# Patient Record
Sex: Female | Born: 1980 | Race: White | Hispanic: No | Marital: Married | Smoking: Former smoker
Health system: Southern US, Community
[De-identification: ages and names within clinical notes are randomized; demographics above are authoritative.]

## PROBLEM LIST (undated history)

## (undated) ENCOUNTER — Inpatient Hospital Stay (HOSPITAL_COMMUNITY): Payer: Self-pay

## (undated) DIAGNOSIS — T7840XA Allergy, unspecified, initial encounter: Secondary | ICD-10-CM

## (undated) DIAGNOSIS — J45909 Unspecified asthma, uncomplicated: Secondary | ICD-10-CM

## (undated) DIAGNOSIS — Z0000 Encounter for general adult medical examination without abnormal findings: Secondary | ICD-10-CM

## (undated) DIAGNOSIS — M722 Plantar fascial fibromatosis: Secondary | ICD-10-CM

## (undated) DIAGNOSIS — N979 Female infertility, unspecified: Secondary | ICD-10-CM

## (undated) DIAGNOSIS — F419 Anxiety disorder, unspecified: Secondary | ICD-10-CM

## (undated) DIAGNOSIS — F32A Depression, unspecified: Secondary | ICD-10-CM

## (undated) DIAGNOSIS — F329 Major depressive disorder, single episode, unspecified: Secondary | ICD-10-CM

## (undated) HISTORY — DX: Anxiety disorder, unspecified: F41.9

## (undated) HISTORY — PX: OTHER SURGICAL HISTORY: SHX169

## (undated) HISTORY — DX: Depression, unspecified: F32.A

## (undated) HISTORY — DX: Plantar fascial fibromatosis: M72.2

## (undated) HISTORY — PX: WISDOM TOOTH EXTRACTION: SHX21

## (undated) HISTORY — PX: APPENDECTOMY: SHX54

## (undated) HISTORY — DX: Allergy, unspecified, initial encounter: T78.40XA

## (undated) HISTORY — DX: Major depressive disorder, single episode, unspecified: F32.9

## (undated) HISTORY — DX: Unspecified asthma, uncomplicated: J45.909

---
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
## 2015-10-02 ENCOUNTER — Emergency Department (HOSPITAL_COMMUNITY)
Admission: EM | Admit: 2015-10-02 | Discharge: 2015-10-02 | Disposition: A | Discharge status: Home / Self Care | Payer: BLUE CROSS/BLUE SHIELD | Attending: Emergency Medicine | Admitting: Emergency Medicine

## 2015-10-02 ENCOUNTER — Ambulatory Visit (INDEPENDENT_AMBULATORY_CARE_PROVIDER_SITE_OTHER): Payer: BLUE CROSS/BLUE SHIELD

## 2015-10-02 ENCOUNTER — Ambulatory Visit (INDEPENDENT_AMBULATORY_CARE_PROVIDER_SITE_OTHER): Payer: BLUE CROSS/BLUE SHIELD | Admitting: Physician Assistant

## 2015-10-02 ENCOUNTER — Encounter (HOSPITAL_COMMUNITY): Payer: Self-pay | Admitting: Emergency Medicine

## 2015-10-02 VITALS — BP 110/60 | HR 97 | Temp 103.1°F | Resp 16 | Ht 68.0 in | Wt 173.0 lb

## 2015-10-02 DIAGNOSIS — R112 Nausea with vomiting, unspecified: Secondary | ICD-10-CM | POA: Diagnosis not present

## 2015-10-02 DIAGNOSIS — R509 Fever, unspecified: Secondary | ICD-10-CM

## 2015-10-02 DIAGNOSIS — J159 Unspecified bacterial pneumonia: Secondary | ICD-10-CM | POA: Diagnosis not present

## 2015-10-02 DIAGNOSIS — G44209 Tension-type headache, unspecified, not intractable: Secondary | ICD-10-CM | POA: Diagnosis not present

## 2015-10-02 DIAGNOSIS — Z3202 Encounter for pregnancy test, result negative: Secondary | ICD-10-CM | POA: Insufficient documentation

## 2015-10-02 DIAGNOSIS — J189 Pneumonia, unspecified organism: Secondary | ICD-10-CM

## 2015-10-02 DIAGNOSIS — R05 Cough: Secondary | ICD-10-CM | POA: Diagnosis present

## 2015-10-02 LAB — CBC WITH DIFFERENTIAL/PLATELET
Basophils Absolute: 0 10*3/uL (ref 0.0–0.1)
Basophils Relative: 0 %
Eosinophils Absolute: 0 10*3/uL (ref 0.0–0.7)
Eosinophils Relative: 0 %
HEMATOCRIT: 39.7 % (ref 36.0–46.0)
Hemoglobin: 14 g/dL (ref 12.0–15.0)
LYMPHS ABS: 0.6 10*3/uL — AB (ref 0.7–4.0)
LYMPHS PCT: 6 %
MCH: 32.7 pg (ref 26.0–34.0)
MCHC: 35.3 g/dL (ref 30.0–36.0)
MCV: 92.8 fL (ref 78.0–100.0)
MONO ABS: 0.3 10*3/uL (ref 0.1–1.0)
MONOS PCT: 4 %
NEUTROS ABS: 8.3 10*3/uL — AB (ref 1.7–7.7)
Neutrophils Relative %: 90 %
Platelets: 153 10*3/uL (ref 150–400)
RBC: 4.28 MIL/uL (ref 3.87–5.11)
RDW: 13.4 % (ref 11.5–15.5)
WBC: 9.2 10*3/uL (ref 4.0–10.5)

## 2015-10-02 LAB — POCT CBC
Granulocyte percent: 90.1 %G — AB (ref 37–80)
HEMATOCRIT: 40.1 % (ref 37.7–47.9)
HEMOGLOBIN: 13.9 g/dL (ref 12.2–16.2)
LYMPH, POC: 0.2 — AB (ref 0.6–3.4)
MCH, POC: 32.1 pg — AB (ref 27–31.2)
MCHC: 34.8 g/dL (ref 31.8–35.4)
MCV: 92.5 fL (ref 80–97)
MID (cbc): 0.2 (ref 0–0.9)
MPV: 8.3 fL (ref 0–99.8)
POC GRANULOCYTE: 7.7 — AB (ref 2–6.9)
POC LYMPH PERCENT: 7 %L — AB (ref 10–50)
POC MID %: 2.9 %M (ref 0–12)
Platelet Count, POC: 148 10*3/uL (ref 142–424)
RBC: 4.34 M/uL (ref 4.04–5.48)
RDW, POC: 13.5 %
WBC: 8.5 10*3/uL (ref 4.6–10.2)

## 2015-10-02 LAB — BASIC METABOLIC PANEL
ANION GAP: 10 (ref 5–15)
BUN: 10 mg/dL (ref 6–20)
CALCIUM: 8.4 mg/dL — AB (ref 8.9–10.3)
CO2: 20 mmol/L — AB (ref 22–32)
CREATININE: 0.88 mg/dL (ref 0.44–1.00)
Chloride: 100 mmol/L — ABNORMAL LOW (ref 101–111)
GFR calc Af Amer: >60 mL/min (ref >60)
GFR calc non Af Amer: >60 mL/min (ref >60)
GLUCOSE: 111 mg/dL — AB (ref 65–99)
Potassium: 3.9 mmol/L (ref 3.5–5.1)
Sodium: 130 mmol/L — ABNORMAL LOW (ref 135–145)

## 2015-10-02 LAB — I-STAT CG4 LACTIC ACID, ED
LACTIC ACID, VENOUS: 1.28 mmol/L (ref 0.5–2.0)
Lactic Acid, Venous: 0.96 mmol/L (ref 0.5–2.0)

## 2015-10-02 LAB — I-STAT BETA HCG BLOOD, ED (MC, WL, AP ONLY): I-stat hCG, quantitative: <5 m[IU]/mL (ref <5)

## 2015-10-02 MED ORDER — GUAIFENESIN-CODEINE 100-10 MG/5ML PO SOLN: 5.0000 mL | Freq: Four times a day (QID) | ORAL | Status: DC | PRN

## 2015-10-02 MED ORDER — AMOXICILLIN 500 MG PO CAPS: 1000.0000 mg | ORAL_CAPSULE | Freq: Three times a day (TID) | ORAL | Status: AC

## 2015-10-02 MED ORDER — AMOXICILLIN 500 MG PO CAPS
1000.0000 mg | ORAL_CAPSULE | Freq: Once | ORAL | Status: AC
  Administered 2015-10-02: 1000 mg via ORAL
  Filled 2015-10-02: qty 2

## 2015-10-02 MED ORDER — AZITHROMYCIN 250 MG PO TABS
500.0000 mg | ORAL_TABLET | Freq: Once | ORAL | Status: AC
  Administered 2015-10-02: 500 mg via ORAL
  Filled 2015-10-02: qty 2

## 2015-10-02 MED ORDER — AEROCHAMBER PLUS FLO-VU SMALL MISC
1.0000 | Freq: Once | Status: AC
  Administered 2015-10-02: 1

## 2015-10-02 MED ORDER — AZITHROMYCIN 250 MG PO TABS: 250.0000 mg | ORAL_TABLET | Freq: Every day | ORAL | Status: AC

## 2015-10-02 MED ORDER — SODIUM CHLORIDE 0.9 % IV BOLUS (SEPSIS)
1000.0000 mL | Freq: Once | INTRAVENOUS | Status: AC
  Administered 2015-10-02: 1000 mL via INTRAVENOUS

## 2015-10-02 MED ORDER — ALBUTEROL SULFATE HFA 108 (90 BASE) MCG/ACT IN AERS
2.0000 | INHALATION_SPRAY | Freq: Once | RESPIRATORY_TRACT | Status: AC
  Administered 2015-10-02: 2 via RESPIRATORY_TRACT
  Filled 2015-10-02: qty 6.7

## 2015-10-02 MED ORDER — IPRATROPIUM-ALBUTEROL 0.5-2.5 (3) MG/3ML IN SOLN
3.0000 mL | Freq: Once | RESPIRATORY_TRACT | Status: AC
  Administered 2015-10-02: 3 mL via RESPIRATORY_TRACT
  Filled 2015-10-02: qty 3

## 2015-10-02 NOTE — ED Notes (Signed)
Pt reports taking  of her own Tylenol for a fever.

## 2015-10-02 NOTE — Discharge Instructions (Signed)
Please return without fail for worsening symptoms, including persistent fever, confusion, difficulty breathing, or any other symptoms concerning to you. Please see your primary care physician on Monday or Tuesday for re-evaluation.  Community-Acquired Pneumonia, Adult Pneumonia is an infection of the lungs. One type of pneumonia can happen while a person is in a hospital. A different type can happen when a person is not in a hospital (community-acquired pneumonia). It is easy for this kind to spread from person to person. It can spread to you if you breathe near an infected person who coughs or sneezes. Some symptoms include:  A dry cough.  A wet (productive) cough.  Fever.  Sweating.  Chest pain. HOME CARE  Take over-the-counter and prescription medicines only as told by your doctor.  Only take cough medicine if you are losing sleep.  If you were prescribed an antibiotic medicine, take it as told by your doctor. Do not stop taking the antibiotic even if you start to feel better.  Sleep with your head and neck raised (elevated). You can do this by putting a few pillows under your head, or you can sleep in a recliner.  Do not use tobacco products. These include cigarettes, chewing tobacco, and e-cigarettes. If you need help quitting, ask your doctor.  Drink enough water to keep your pee (urine) clear or pale yellow. A shot (vaccine) can help prevent pneumonia. Shots are often suggested for:  People older than 35 years of age.  People older than 35 years of age:  Who are having cancer treatment.  Who have long-term (chronic) lung disease.  Who have problems with their body's defense system (immune system). You may also prevent pneumonia if you take these actions:  Get the flu (influenza) shot every year.  Go to the dentist as often as told.  Wash your hands often. If soap and water are not available, use hand sanitizer. GET HELP IF:  You have a fever.  You lose sleep  because your cough medicine does not help. GET HELP RIGHT AWAY IF:  You are short of breath and it gets worse.  You have more chest pain.  Your sickness gets worse. This is very serious if:  You are an older adult.  Your body's defense system is weak.  You cough up blood.   This information is not intended to replace advice given to you by your health care provider. Make sure you discuss any questions you have with your health care provider.   Document Released: 02/14/2008 Document Revised: 05/19/2015 Document Reviewed: 12/23/2014 Elsevier Interactive Patient Education [employer]

## 2015-10-02 NOTE — Progress Notes (Signed)
Subjective:    Patient ID: Jasmine Bullock, female    DOB: 1980-10-12, 35 y.o.   MRN: [id number]  Chief Complaint  Patient presents with  . Cough    All symptoms x 3 days  . Headache  . Fever  . Emesis   Medications, allergies, past medical history, surgical history, family history, social history and problem list reviewed and updated.  HPI  35 yo healthy female presents with above complaints.   Symptoms started 5 days ago with nausea. That night had cold sweats and chills. Persistent since onset. Checked temp for the first time 2 days ago and has been running 103-104 for past 2 days. Comes down with tylenol/ibuprofen. Coughing persistent past few days with yellow/brown sputum.   Temp at home this am 104, 103.1 in clinic. Got the flu vaccine this fall. Is living with her brother in law right now who is an MD. He brought flu kit from clinic and swabbed her yesterday, negative per pt and husband. 4-5 total episodes non bloody emesis past few days. No diarrhea. Intermittent frontal headaches past few days. None currently. Denies neck stiffness. No known sick contacts but has young son along with 2 nephews she's living with. Muscles achy past few days. No hx migraines. Denies abd pain.   Review of Systems See HPI     Objective:   Physical Exam  Constitutional: She appears well-developed and well-nourished.  Non-toxic appearance. She does not have a sickly appearance. She does not appear ill. No distress.  BP 110/60 mmHg  Pulse 97  Temp(Src) 103.1 F (39.5 C) (Oral)  Resp 16  Ht 5' 8"  (1.727 m)  Wt 173 lb (78.472 kg)  BMI 26.31 kg/m2  SpO2 93%  LMP 10/02/2015   HENT:  Right Ear: Tympanic membrane normal.  Left Ear: Tympanic membrane normal.  Nose: No mucosal edema or rhinorrhea. Right sinus exhibits no maxillary sinus tenderness and no frontal sinus tenderness. Left sinus exhibits no maxillary sinus tenderness and no frontal sinus tenderness.  Mouth/Throat: Uvula is midline,  oropharynx is clear and moist and mucous membranes are normal.  Neck: No Brudzinski's sign noted.  Pulmonary/Chest: Effort normal. No tachypnea. She has no decreased breath sounds. She has no wheezes. She has rhonchi in the right upper field and the right lower field. She has rales in the right upper field.  Lymphadenopathy:       Head (right side): No submental, no submandibular and no tonsillar adenopathy present.       Head (left side): No submental, no submandibular and no tonsillar adenopathy present.    She has cervical adenopathy.       Right cervical: Posterior cervical adenopathy present. No superficial cervical and no deep cervical adenopathy present.      Left cervical: Posterior cervical adenopathy present. No superficial cervical and no deep cervical adenopathy present.   Results for orders placed or performed in visit on 10/02/15  POCT CBC  Result Value Ref Range   WBC 8.5 4.6 - 10.2 K/uL   Lymph, poc 0.2 (A) 0.6 - 3.4   POC LYMPH PERCENT 7.0 (A) 10 - 50 %L   MID (cbc) 0.2 0 - 0.9   POC MID % 2.9 0 - 12 %M   POC Granulocyte 7.7 (A) 2 - 6.9   Granulocyte percent 90.1 (A) 37 - 80 %G   RBC 4.34 4.04 - 5.48 M/uL   Hemoglobin 13.9 12.2 - 16.2 g/dL   HCT, POC 40.1 37.7 - 47.9 %  MCV 92.5 80 - 97 fL   MCH, POC 32.1 (A) 27 - 31.2 pg   MCHC 34.8 31.8 - 35.4 g/dL   RDW, POC 13.5 %   Platelet Count, POC 148 142 - 424 K/uL   MPV 8.3 0 - 99.8 fL   [facility] reading (PRIMARY) by  Dr. Linna Darner. Chest findings: Bilateral patchy infiltrate with dense RLL infiltrate and probable effusion.      Assessment & Plan:   Non-intractable vomiting with nausea, vomiting of unspecified type  Chills with fever - Plan: DG Chest 2 View, POCT CBC, Epstein-Barr virus VCA antibody panel  Tension-type headache, not intractable, unspecified chronicity pattern --cxr concerning for bilateral pna, o2 low normal for 35 yo female, persistent fevers --to ED for further eval --ebv panel sent, will f/u as  results return --no leukocytosis  Julieta Gutting, PA-C Physician Assistant-Certified [facility]  10/02/2015 2:05 PM

## 2015-10-02 NOTE — ED Provider Notes (Addendum)
CSN: [id number]     Arrival date & time 10/02/15  1424 History   First MD Initiated Contact with Patient 10/02/15 1720     Chief Complaint  Patient presents with  . Pneumonia     (Consider location/radiation/quality/duration/timing/severity/associated sxs/prior Treatment) HPI 35 year old female who presents with 4 days of cough. Is otherwise healthy. States that she is currently living with her brother-in-law's family who has all been recently ill with upper respiratory infection. Her husband also states that he had recently finished a course of antibiotics for respiratory symptoms. 4 days ago developed mild nausea, with productive cough and fever. Has been having chest tightness and increasing shortness of breath. Seen at urgent care today with a chest x-ray suggestive of pneumonia. She was sent to the ED for further evaluation. Denies chest pain, lower extremity swelling.  History reviewed. No pertinent past medical history. History reviewed. No pertinent past surgical history. Family History  Problem Relation Age of Onset  . Hypertension Mother   . Cancer Maternal Grandmother   . High Cholesterol Paternal Grandmother   . Diabetes Paternal Grandfather    Social History  Substance Use Topics  . Smoking status: Never Smoker   . Smokeless tobacco: None  . Alcohol Use: None   OB History    No data available     Review of Systems 10/14 systems reviewed and are negative other than those stated in the HPI    Allergies  Review of patient's allergies indicates no known allergies.  Home Medications   Prior to Admission medications   Medication Sig Start Date End Date Taking? Authorizing Provider  amoxicillin (AMOXIL) 500 MG capsule Take 2 capsules (1,000 mg total) by mouth 3 (three) times daily. 10/03/15 10/09/15  Lavera Guise, MD  azithromycin (ZITHROMAX) 250 MG tablet Take 1 tablet (250 mg total) by mouth daily. Take first 2 tablets together, then 1 every day until finished.  10/03/15 10/06/15  Lavera Guise, MD  citalopram (CELEXA) 10 MG tablet Take 30 mg by mouth. 05/06/15   Historical Provider, MD  guaiFENesin-codeine 100-10 MG/5ML syrup Take 5 mLs by mouth every 6 (six) hours as needed for cough. 10/02/15   Lavera Guise, MD  PRENATAL 28-0.8 MG TABS Take by mouth.    Historical Provider, MD   BP 97/54 mmHg  Pulse 85  Temp(Src) 100.5 F (38.1 C) (Oral)  Resp 18  SpO2 94%  LMP 10/02/2015 Physical Exam Physical Exam  Nursing note and vitals reviewed. Constitutional: Well developed, well nourished, non-toxic, and in no acute distress Head: Normocephalic and atraumatic.  Mouth/Throat: Oropharynx is clear and moist.  Neck: Normal range of motion. Neck supple.  Cardiovascular: Normal rate and regular rhythm.   Pulmonary/Chest: Effort normal. No conversational dyspnea. Coarse breath sounds throughout with bronchospastic cough. Abdominal: Soft. There is no tenderness. There is no rebound and no guarding.  Musculoskeletal: Normal range of motion.  Neurological: Alert, no facial droop, fluent speech, moves all extremities symmetrically Skin: Skin is warm and dry.  Psychiatric: Cooperative  ED Course  Procedures (including critical care time) Labs Review Labs Reviewed  CBC WITH DIFFERENTIAL/PLATELET - Abnormal; Notable for the following:    Neutro Abs 8.3 (*)    Lymphs Abs 0.6 (*)    All other components within normal limits  BASIC METABOLIC PANEL - Abnormal; Notable for the following:    Sodium 130 (*)    Chloride 100 (*)    CO2 20 (*)    Glucose, Bld 111 (*)  Calcium 8.4 (*)    All other components within normal limits  I-STAT CG4 LACTIC ACID, ED  I-STAT BETA HCG BLOOD, ED ([facility], [facility], [facility] ONLY)  I-STAT CG4 LACTIC ACID, ED    Imaging Review Dg Chest 2 View  10/02/2015  CLINICAL DATA:  Cough and fever EXAM: CHEST - 2 VIEW COMPARISON:  None. FINDINGS: Cardiac shadow is at the upper limits of normal in size. Patchy infiltrates are noted throughout both  lungs but worst in the right lower lobe consistent with multifocal pneumonia. No bony abnormality is noted. IMPRESSION: Multifocal pneumonia. Electronically Signed   By: Alcide Clever M.D.   On: 10/02/2015 13:54   I have personally reviewed and evaluated these images and lab results as part of my medical decision-making.   EKG Interpretation None      MDM   Final diagnoses:  Community acquired pneumonia    34 year old female who presents with pneumonia. Is febrile on arrival to 102 Fahrenheit, but hemodynamically stable without tachycardia. She is saturating in the mid 90 percentile on room air, with normal work of breathing, no conversational dyspnea. With bronchospastic cough and coarse breath sounds on lung exam. Basic blood work reveals normal lactate and no significant leukocytosis. She is not pregnant and the remainder of her blood work is unremarkable. Chest x-ray from urgent care is reviewed and she does have evidence of multifocal pneumonia. No risk factors for HCAP. No signs of systemic illness. Fever resolves with tylenol and no tachycardia or hypotension. Ambulates in ED without reported dyspnea and no hypoxia. Cough improved with breathing treatment. Given azithromycin and amoxicillin for treatment. Will have close 1-2 day follow-up with PCP. Strict return instructions reviewed.  She expressed understanding of all discharge instructions and felt comfortable with the plan of care.     Lavera Guise, MD 10/03/15 [id number]  Lavera Guise, MD 10/03/15 [phone]

## 2015-10-02 NOTE — ED Notes (Signed)
Pt ambulates independently and with steady gait at time of discharge. Discharge instructions and follow up information reviewed with patient. No other questions or concerns voiced at this time.  

## 2015-10-02 NOTE — ED Notes (Signed)
Pt here from [facility] c/o not feeling well, fever and productive cough x 4 days; pt diagnosed with pna

## 2015-10-02 NOTE — Patient Instructions (Signed)
Your chest xray was concerning for pneumonia and your oxygen saturation was low. Please go to the ED for further evaluation.  You will likely need antibiotics, whether you get these as an inpatient or outpatient they can decide.   Community-Acquired Pneumonia, Adult Pneumonia is an infection of the lungs. There are different types of pneumonia. One type can develop while a person is in a hospital. A different type, called community-acquired pneumonia, develops in people who are not, or have not recently been, in the hospital or other health care facility.  CAUSES Pneumonia may be caused by bacteria, viruses, or funguses. Community-acquired pneumonia is often caused by Streptococcus pneumonia bacteria. These bacteria are often passed from one person to another by breathing in droplets from the cough or sneeze of an infected person. RISK FACTORS The condition is more likely to develop in:  People who havechronic diseases, such as chronic obstructive pulmonary disease (COPD), asthma, congestive heart failure, cystic fibrosis, diabetes, or kidney disease.  People who haveearly-stage or late-stage HIV.  People who havesickle cell disease.  People who havehad their spleen removed (splenectomy).  People who havepoor Administrator.  People who havemedical conditions that increase the risk of breathing in (aspirating) secretions their own mouth and nose.   People who havea weakened immune system (immunocompromised).  People who smoke.  People whotravel to areas where pneumonia-causing germs commonly exist.  People whoare around animal habitats or animals that have pneumonia-causing germs, including birds, bats, rabbits, cats, and farm animals. SYMPTOMS Symptoms of this condition include:  Adry cough.  A wet (productive) cough.  Fever.  Sweating.  Chest pain, especially when breathing deeply or coughing.  Rapid breathing or difficulty breathing.  Shortness of  breath.  Shaking chills.  Fatigue.  Muscle aches. DIAGNOSIS Your health care provider will take a medical history and perform a physical exam. You may also have other tests, including:  Imaging studies of your chest, including X-rays.  Tests to check your blood oxygen level and other blood gases.  Other tests on blood, mucus (sputum), fluid around your lungs (pleural fluid), and urine. If your pneumonia is severe, other tests may be done to identify the specific cause of your illness. TREATMENT The type of treatment that you receive depends on many factors, such as the cause of your pneumonia, the medicines you take, and other medical conditions that you have. For most adults, treatment and recovery from pneumonia may occur at home. In some cases, treatment must happen in a hospital. Treatment may include:  Antibiotic medicines, if the pneumonia was caused by bacteria.  Antiviral medicines, if the pneumonia was caused by a virus.  Medicines that are given by mouth or through an IV tube.  Oxygen.  Respiratory therapy. Although rare, treating severe pneumonia may include:  Mechanical ventilation. This is done if you are not breathing well on your own and you cannot maintain a safe blood oxygen level.  Thoracentesis. This procedureremoves fluid around one lung or both lungs to help you breathe better. HOME CARE INSTRUCTIONS  Take over-the-counter and prescription medicines only as told by your health care provider.  Only takecough medicine if you are losing sleep. Understand that cough medicine can prevent your body's natural ability to remove mucus from your lungs.  If you were prescribed an antibiotic medicine, take it as told by your health care provider. Do not stop taking the antibiotic even if you start to feel better.  Sleep in a semi-upright position at night. Try sleeping in  a reclining chair, or place a few pillows under your head.  Do not use tobacco products,  including cigarettes, chewing tobacco, and e-cigarettes. If you need help quitting, ask your health care provider.  Drink enough water to keep your urine clear or pale yellow. This will help to thin out mucus secretions in your lungs. PREVENTION There are ways that you can decrease your risk of developing community-acquired pneumonia. Consider getting a pneumococcal vaccine if:  You are older than 35 years of age.  You are older than 35 years of age and are undergoing cancer treatment, have chronic lung disease, or have other medical conditions that affect your immune system. Ask your health care provider if this applies to you. There are different types and schedules of pneumococcal vaccines. Ask your health care provider which vaccination option is best for you. You may also prevent community-acquired pneumonia if you take these actions:  Get an influenza vaccine every year. Ask your health care provider which type of influenza vaccine is best for you.  Go to the dentist on a regular basis.  Wash your hands often. Use hand sanitizer if soap and water are not available. SEEK MEDICAL CARE IF:  You have a fever.  You are losing sleep because you cannot control your cough with cough medicine. SEEK IMMEDIATE MEDICAL CARE IF:  You have worsening shortness of breath.  You have increased chest pain.  Your sickness becomes worse, especially if you are an older adult or have a weakened immune system.  You cough up blood.   This information is not intended to replace advice given to you by your health care provider. Make sure you discuss any questions you have with your health care provider.   Document Released: 08/28/2005 Document Revised: 05/19/2015 Document Reviewed: 12/23/2014 Elsevier Interactive Patient Education [employer]

## 2015-10-02 NOTE — ED Notes (Signed)
93% RA while Ambulating.

## 2015-10-04 LAB — EPSTEIN-BARR VIRUS VCA ANTIBODY PANEL
EBV NA IGG: 266 U/mL — AB (ref <18.0)
EBV VCA IGG: 340 U/mL — AB (ref <18.0)

## 2015-10-05 ENCOUNTER — Ambulatory Visit (INDEPENDENT_AMBULATORY_CARE_PROVIDER_SITE_OTHER): Payer: BLUE CROSS/BLUE SHIELD | Admitting: Family Medicine

## 2015-10-05 ENCOUNTER — Ambulatory Visit: Payer: BLUE CROSS/BLUE SHIELD | Admitting: Emergency Medicine

## 2015-10-05 ENCOUNTER — Encounter: Payer: Self-pay | Admitting: Family Medicine

## 2015-10-05 VITALS — BP 99/69 | HR 88 | Temp 97.5°F | Resp 16 | Ht 68.0 in | Wt 168.0 lb

## 2015-10-05 DIAGNOSIS — K219 Gastro-esophageal reflux disease without esophagitis: Secondary | ICD-10-CM

## 2015-10-05 DIAGNOSIS — J189 Pneumonia, unspecified organism: Secondary | ICD-10-CM

## 2015-10-05 NOTE — Progress Notes (Signed)
   Subjective:    Patient ID: Jasmine Bullock, female    DOB: 1980-12-26, 35 y.o.   MRN: [id number]  HPI This is a pleasant 35 yo female who presents today for follow up of ED visit 10/02/15. She was seen at [facility] walk in center with fever, cough, headache and vomiting. She had bilateral patchy infiltrates on CXR and pulse ox of 93%. She was sent to the ED where she was given breathing treatment, amoxicillin and azithromycin and released. She is feeling better with no fever over 101 in last 24 hours. She is tolerating medications without difficulty. Appetite has returned. Still with severe fatigue. Myalgias improving. Feels winded with exertion, no SOB with rest or talking, some pain with cough. Little sputum production. Feels burning in esophagus. Has ranitidine at home, hasn't tried. Has albuterol inhaler, has not been using regularly.   No past medical history on file. No past surgical history on file. Family History  Problem Relation Age of Onset  . Hypertension Mother   . Cancer Maternal Grandmother   . High Cholesterol Paternal Grandmother   . Diabetes Paternal Grandfather    Social History  Substance Use Topics  . Smoking status: Never Smoker   . Smokeless tobacco: None  . Alcohol Use: 2.4 oz/week    4 Standard drinks or equivalent per week    Review of Systems Per HPI    Objective:   Physical Exam Physical Exam  Constitutional: Oriented to person, place, and time. She appears well-developed and well-nourished. Appears fatigued.  HENT:  Head: Normocephalic and atraumatic.  Eyes: Conjunctivae are normal.  Neck: Normal range of motion. Neck supple.  Cardiovascular: Normal rate, regular rhythm and normal heart sounds.   Pulmonary/Chest: Effort normal and breath sounds normal.  Musculoskeletal: Normal range of motion.  Neurological: Alert and oriented to person, place, and time.  Skin: Skin is warm and dry.  Psychiatric: Normal mood and affect. Behavior is normal. Judgment and  thought content normal.  Vitals reviewed. BP 99/69 mmHg  Pulse 88  Temp(Src) 97.5 F (36.4 C) (Oral)  Resp 16  Ht  (1.727 m)  Wt 168 lb (76.204 kg)  BMI 25.55 kg/m2  SpO2 97%  LMP 10/02/2015 Wt Readings from Last 3 Encounters:  10/05/15 168 lb (76.204 kg)  10/02/15 173 lb (78.472 kg)       Assessment & Plan:  1. CAP (community acquired pneumonia) - patient improving on amoxicillin/azithromycin- she was instructed to finish - encouraged her to use albuterol q4-6 hours while awake for cough - add mucinex to thin secretions - push fluids - repeat CXR in 2-3 weeks  2. Gastroesophageal reflux disease, esophagitis presence not specified - suggested she take ranitidine BID for 2 weeks   - follow up in 2 weeks for repeat CXR, RTC precautions reviewed  Olean Ree, FNP-BC  [facility], [facility]  10/07/2015 11:00 PM

## 2015-10-05 NOTE — Patient Instructions (Signed)
Please take mucinex to thin your secretions  Take your ranitidine twice a day for 2 weeks for healing of your stomach/esophagus  Try to use your inhaler every 4-6 hours for the next couple of days to see if that helps your breathing  Drink lots of fluids until your urine is light yellow.

## 2015-10-09 ENCOUNTER — Ambulatory Visit (INDEPENDENT_AMBULATORY_CARE_PROVIDER_SITE_OTHER): Payer: BLUE CROSS/BLUE SHIELD

## 2015-10-09 ENCOUNTER — Telehealth: Payer: Self-pay | Admitting: *Deleted

## 2015-10-09 ENCOUNTER — Ambulatory Visit (INDEPENDENT_AMBULATORY_CARE_PROVIDER_SITE_OTHER): Payer: BLUE CROSS/BLUE SHIELD | Admitting: Family Medicine

## 2015-10-09 ENCOUNTER — Ambulatory Visit (HOSPITAL_COMMUNITY): Payer: Self-pay

## 2015-10-09 VITALS — BP 108/68 | HR 69 | Temp 97.9°F | Resp 20 | Ht 67.72 in | Wt 165.6 lb

## 2015-10-09 DIAGNOSIS — J9801 Acute bronchospasm: Secondary | ICD-10-CM | POA: Diagnosis not present

## 2015-10-09 DIAGNOSIS — R6883 Chills (without fever): Secondary | ICD-10-CM

## 2015-10-09 DIAGNOSIS — J189 Pneumonia, unspecified organism: Secondary | ICD-10-CM | POA: Diagnosis not present

## 2015-10-09 DIAGNOSIS — R05 Cough: Secondary | ICD-10-CM

## 2015-10-09 DIAGNOSIS — E871 Hypo-osmolality and hyponatremia: Secondary | ICD-10-CM

## 2015-10-09 LAB — COMPREHENSIVE METABOLIC PANEL
ALBUMIN: 3.6 g/dL (ref 3.6–5.1)
ALT: 24 U/L (ref 6–29)
AST: 25 U/L (ref 10–30)
Alkaline Phosphatase: 44 U/L (ref 33–115)
BUN: 9 mg/dL (ref 7–25)
CALCIUM: 8.8 mg/dL (ref 8.6–10.2)
CHLORIDE: 105 mmol/L (ref 98–110)
CO2: 24 mmol/L (ref 20–31)
Creat: 0.69 mg/dL (ref 0.50–1.10)
Glucose, Bld: 80 mg/dL (ref 65–99)
POTASSIUM: 4.7 mmol/L (ref 3.5–5.3)
Sodium: 138 mmol/L (ref 135–146)
TOTAL PROTEIN: 6.9 g/dL (ref 6.1–8.1)
Total Bilirubin: 0.5 mg/dL (ref 0.2–1.2)

## 2015-10-09 LAB — POCT CBC
Granulocyte percent: 80.7 %G — AB (ref 37–80)
HEMATOCRIT: 41 % (ref 37.7–47.9)
HEMOGLOBIN: 14.4 g/dL (ref 12.2–16.2)
Lymph, poc: 2.6 (ref 0.6–3.4)
MCH: 32.2 pg — AB (ref 27–31.2)
MCHC: 35 g/dL (ref 31.8–35.4)
MCV: 92 fL (ref 80–97)
MID (cbc): 0.4 (ref 0–0.9)
MPV: 7.5 fL (ref 0–99.8)
POC GRANULOCYTE: 12.5 — AB (ref 2–6.9)
POC LYMPH PERCENT: 16.7 %L (ref 10–50)
POC MID %: 2.6 % (ref 0–12)
Platelet Count, POC: 454 10*3/uL — AB (ref 142–424)
RBC: 4.46 M/uL (ref 4.04–5.48)
RDW, POC: 13.5 %
WBC: 15.5 10*3/uL — AB (ref 4.6–10.2)

## 2015-10-09 LAB — POCT INFLUENZA A/B
Influenza A, POC: NEGATIVE
Influenza B, POC: NEGATIVE

## 2015-10-09 LAB — GLUCOSE, POCT (MANUAL RESULT ENTRY): POC GLUCOSE: 96 mg/dL (ref 70–99)

## 2015-10-09 MED ORDER — ALBUTEROL SULFATE (2.5 MG/3ML) 0.083% IN NEBU
2.5000 mg | INHALATION_SOLUTION | Freq: Once | RESPIRATORY_TRACT | Status: AC
  Administered 2015-10-09: 2.5 mg via RESPIRATORY_TRACT

## 2015-10-09 MED ORDER — ALBUTEROL SULFATE HFA 108 (90 BASE) MCG/ACT IN AERS: 2.0000 | INHALATION_SPRAY | Freq: Four times a day (QID) | RESPIRATORY_TRACT | Status: DC | PRN

## 2015-10-09 MED ORDER — LEVOFLOXACIN 750 MG PO TABS: 750.0000 mg | ORAL_TABLET | Freq: Every day | ORAL | Status: DC

## 2015-10-09 NOTE — Telephone Encounter (Signed)
Pt called, problem already resolved

## 2015-10-09 NOTE — Telephone Encounter (Signed)
Spoke with pharmacist/[facility].  Benefits of Levaquin outweigh the risk of side effects of QT prolongation.  Patient without any cardiac history; pharmacist agreeable.  To fill Levaquin.

## 2015-10-09 NOTE — Progress Notes (Signed)
Subjective:    Patient ID: Jasmine Bullock, female    DOB: 03/19/1981, 35 y.o.   MRN: [id number]  10/09/2015  Pneumonia and Shortness of Breath   HPI This 35 y.o. female presents for 4 day follow-up of pneumonia.  Will complete antibiotics tonight.  Started feeling better.  Last night, cough was improved for the first night.  Yesterday, had a really good day. This morning woke up and SOB with walking upstairs.  Started coughing up more sputum and more yellow.  Did not want to worsen.  Brother in Social worker is physician; staying with them with the next two weeks.  Recommended to have patient return.  Feeling worse today than yesterday.  Feeling better with resting on table. Has a lot going on. May be pushing self too hard.  ONset of symptoms 12 days ago.  Fever Tmax 104 measured but feels ran higher.  Traditional 105.3.  Last fever unsure; still having night sweats; some night sweats last night.  Did not soak the bed last night.  Slightly chilled yesterday; Ibuprofen with improvement.  Mild headache.  Severe headache with initial presentation; slight sore throat.  No ear pain.  +rhinorrhea; clear rhinorrhea; mild nasal congestion.  Excessive cough; has greatly improved in psat 24-36 hours.  Sleeping sitting up.  Sputum did become more yellow; dollar coin; less now than a few days ago.  Minimal sputum production.  Darker yellow.  SOB has been consistent yet worse this morning.  Wheezing usually because of mucous plugging; cough clears wheezing.  Albuterol every six hours.  Not seeming to help.  Vomiting last one week.  No dairrhea.  No rash.  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  Two year-old son; started daycare yesterday.  No travel.  Husband works at [employer] in [place] with [employer].    ED visit: breathing treatment, iv fluids, rx for Amoxicillin, Zithromax.   Follow-up with Deboraha Sprang on 1/24: recommended reflux, Mucinex once daily, complete abx, inhaler.     LMP 10-02-15.  Review of Systems  Constitutional: Positive for chills and diaphoresis. Negative for fever and fatigue.  HENT: Positive for congestion and postnasal drip. Negative for ear pain, sinus pressure and sore throat.   Eyes: Negative for visual disturbance.  Respiratory: Positive for cough and shortness of breath. Negative for wheezing.   Cardiovascular: Negative for chest pain, palpitations and leg swelling.  Gastrointestinal: Negative for nausea, vomiting, abdominal pain, diarrhea and constipation.  Endocrine: Negative for cold intolerance, heat intolerance, polydipsia, polyphagia and polyuria.  Skin: Negative for rash.  Neurological: Negative for dizziness, tremors, seizures, syncope, facial asymmetry, speech difficulty, weakness, light-headedness, numbness and headaches.    History reviewed. No pertinent past medical history. History reviewed. No pertinent past surgical history. No Known Allergies  Social History   Social History  . Marital Status: Married    Spouse Name: N/A  . Number of Children: N/A  . Years of Education: N/A   Occupational History  . Not on file.   Social History Main Topics  . Smoking status: Never Smoker   . Smokeless tobacco: Not on file  . Alcohol Use: 2.4 oz/week    4 Standard drinks or equivalent per week  . Drug Use: No  . Sexual Activity: Not on file   Other Topics Concern  . Not on file   Social History Narrative   Family History  Problem Relation Age of Onset  . Hypertension Mother   . Cancer Maternal Grandmother   .  High Cholesterol Paternal Grandmother   . Diabetes Paternal Grandfather        Objective:    BP 108/68 mmHg  Pulse 69  Temp(Src) 97.9 F (36.6 C) (Oral)  Resp 20  Ht 5' 7.72" (1.72 m)  Wt 165 lb 9.6 oz (75.116 kg)  BMI 25.39 kg/m2  SpO2 98%  LMP 10/02/2015 Physical Exam  Constitutional: She is oriented to person, place, and time. She appears well-developed and well-nourished. No distress.   HENT:  Head: Normocephalic and atraumatic.  Right Ear: External ear normal.  Left Ear: External ear normal.  Nose: Nose normal.  Mouth/Throat: Oropharynx is clear and moist.  Eyes: Conjunctivae and EOM are normal. Pupils are equal, round, and reactive to light.  Neck: Normal range of motion. Neck supple. Carotid bruit is not present. No thyromegaly present.  Cardiovascular: Normal rate, regular rhythm, normal heart sounds and intact distal pulses.  Exam reveals no gallop and no friction rub.   No murmur heard. Pulmonary/Chest: Effort normal and breath sounds normal. No respiratory distress. She has no wheezes. She has no rales.  +decreased breath sounds.  Abdominal: Soft. Bowel sounds are normal. She exhibits no distension and no mass. There is no tenderness. There is no rebound and no guarding.  Lymphadenopathy:    She has no cervical adenopathy.  Neurological: She is alert and oriented to person, place, and time. No cranial nerve deficit.  Skin: Skin is warm and dry. No rash noted. She is not diaphoretic. No erythema. No pallor.  Psychiatric: She has a normal mood and affect. Her behavior is normal.   Results for orders placed or performed in visit on 10/09/15  POCT CBC  Result Value Ref Range   WBC 15.5 (A) 4.6 - 10.2 K/uL   Lymph, poc 2.6 0.6 - 3.4   POC LYMPH PERCENT 16.7 10 - 50 %L   MID (cbc) 0.4 0 - 0.9   POC MID % 2.6 0 - 12 %M   POC Granulocyte 12.5 (A) 2 - 6.9   Granulocyte percent 80.7 (A) 37 - 80 %G   RBC 4.46 4.04 - 5.48 M/uL   Hemoglobin 14.4 12.2 - 16.2 g/dL   HCT, POC 16.1 09.6 - 47.9 %   MCV 92.0 80 - 97 fL   MCH, POC 32.2 (A) 27 - 31.2 pg   MCHC 35.0 31.8 - 35.4 g/dL   RDW, POC 04.5 %   Platelet Count, POC 454 (A) 142 - 424 K/uL   MPV 7.5 0 - 99.8 fL  POCT glucose (manual entry)  Result Value Ref Range   POC Glucose 96 70 - 99 mg/dl  POCT Influenza A/B  Result Value Ref Range   Influenza A, POC Negative Negative   Influenza B, POC Negative Negative    Dg Chest 2 View  10/09/2015  CLINICAL DATA:  Shortness of breath, community acquired pneumonia, persistent shortness of breath EXAM: CHEST  2 VIEW COMPARISON:  10/02/2015 FINDINGS: Improvement in the upper lobe and perihilar patchy nodular airspace disease. There is residual persistent right lower lobe consolidative airspace disease with central air bronchograms compatible residual right lower lobe pneumonia. Small right effusion suspected. Normal heart size and vascularity. No pneumothorax. Trachea midline. IMPRESSION: Improving multifocal pneumonia with residual right lower lobe consolidation. Electronically Signed   By: Judie Petit.  Shick M.D.   On: 10/09/2015 13:28   ALBUTEROL NEBULIZER    Assessment & Plan:   1. CAP (community acquired pneumonia)   2. Hyponatremia   3. Bronchospasm    -  improving radiographically yet symptoms persistent and WBC has worsened with a persistent left shift. -rx for Levaquin  daily for seven days provided. -continue Mucinex DM, Albuterol qid, rest. -RTC for acute worsening. -call in 48 hours with update.  -spoke with Dr. Luciana Axe of ID; he suggested no further treatment due to likely viral etiology versus adding Levaquin.   -interaction of Levaquin  with Citalopram (QT prolongation); patient without cardiac history and also age 83; QT prolongation risk lower. -hyponatremic in ED; repeat CMET.   Orders Placed This Encounter  Procedures  . DG Chest 2 View    Standing Status: Future     Number of Occurrences: 1     Standing Expiration Date: 10/08/2016    Order Specific Question:  Reason for Exam (SYMPTOM  OR DIAGNOSIS REQUIRED)    Answer:  pnuemonia with persistent SOB    Order Specific Question:  Is the patient pregnant?    Answer:  No    Order Specific Question:  Preferred imaging location?    Answer:  External  . Comprehensive metabolic panel  . POCT CBC  . POCT glucose (manual entry)  . POCT Influenza A/B   Meds ordered this encounter   Medications  . albuterol (PROVENTIL) (2.5 MG/3ML) 0.083% nebulizer solution 2.5 mg    Sig:   . levofloxacin (LEVAQUIN) 750 MG tablet    Sig: Take 1 tablet (750 mg total) by mouth daily.    Dispense:  7 tablet    Refill:  0  . albuterol (PROVENTIL HFA;VENTOLIN HFA) 108 (90 Base) MCG/ACT inhaler    Sig: Inhale 2 puffs into the lungs every 6 (six) hours as needed for wheezing or shortness of breath (cough, shortness of breath or wheezing.).    Dispense:  1 Inhaler    Refill:  1    No Follow-up on file.    Kristi Paulita Fujita, M.D. [facility] [address] [place], [place]  [address] [phone] phone [phone] fax

## 2015-10-09 NOTE — Telephone Encounter (Signed)
Zarna from [employer] called and said that there is an interaction between Levaquin and Celexa.  Can we change to something else?

## 2015-10-09 NOTE — Patient Instructions (Addendum)
Because you received an x-ray today, you will receive an invoice from [facility]. Please contact [facility] at [phone] with questions or concerns regarding your invoice. Our billing staff will not be able to assist you with those questions.  Dg Chest 2 View  10/09/2015  CLINICAL DATA:  Shortness of breath, community acquired pneumonia, persistent shortness of breath EXAM: CHEST  2 VIEW COMPARISON:  10/02/2015 FINDINGS: Improvement in the upper lobe and perihilar patchy nodular airspace disease. There is residual persistent right lower lobe consolidative airspace disease with central air bronchograms compatible residual right lower lobe pneumonia. Small right effusion suspected. Normal heart size and vascularity. No pneumothorax. Trachea midline. IMPRESSION: Improving multifocal pneumonia with residual right lower lobe consolidation. Electronically Signed   By: Judie Petit.  Shick M.D.   On: 10/09/2015 13:28

## 2015-10-11 ENCOUNTER — Encounter: Payer: Self-pay | Admitting: Family Medicine

## 2015-10-19 ENCOUNTER — Ambulatory Visit: Payer: BLUE CROSS/BLUE SHIELD | Admitting: Family Medicine

## 2015-11-29 ENCOUNTER — Other Ambulatory Visit: Payer: Self-pay | Admitting: Family Medicine

## 2015-11-30 NOTE — Telephone Encounter (Signed)
Call-- I have refilled Celexa for patient but I have not seen her for depression/anxiety; thus, she will need to be seen in the upcoming month for further refills.

## 2015-11-30 NOTE — Telephone Encounter (Signed)
Dr. Katrinka BlazingSmith,  I do not see anything about Celexa in any recent notes.  Please advise

## 2015-12-09 NOTE — Telephone Encounter (Signed)
Spoke to pt who reported that she stopped taking the celexa so she did not p/up RF. Stated that she is feeling fine without it for now, but will RTC to see Dr Katrinka BlazingSmith if she feels she needs to go back on it. Pt reported that she is in bed w/fever, N/V this morning. Advised her to stay hydrated and come in if she can't keep liquids down. Also discussed BRAT diet. Pt agreed.

## 2015-12-12 ENCOUNTER — Telehealth: Payer: Self-pay

## 2015-12-12 ENCOUNTER — Ambulatory Visit (INDEPENDENT_AMBULATORY_CARE_PROVIDER_SITE_OTHER): Payer: BLUE CROSS/BLUE SHIELD | Admitting: Osteopathic Medicine

## 2015-12-12 VITALS — BP 118/69 | HR 69 | Temp 98.6°F | Resp 16 | Ht 67.0 in | Wt 166.4 lb

## 2015-12-12 DIAGNOSIS — R109 Unspecified abdominal pain: Secondary | ICD-10-CM

## 2015-12-12 DIAGNOSIS — R110 Nausea: Secondary | ICD-10-CM | POA: Diagnosis not present

## 2015-12-12 DIAGNOSIS — B373 Candidiasis of vulva and vagina: Secondary | ICD-10-CM | POA: Diagnosis not present

## 2015-12-12 DIAGNOSIS — B3731 Acute candidiasis of vulva and vagina: Secondary | ICD-10-CM

## 2015-12-12 DIAGNOSIS — K2970 Gastritis, unspecified, without bleeding: Secondary | ICD-10-CM

## 2015-12-12 LAB — POCT URINE PREGNANCY: PREG TEST UR: NEGATIVE

## 2015-12-12 MED ORDER — ONDANSETRON 4 MG PO TBDP: 8.0000 mg | ORAL_TABLET | Freq: Once | ORAL | Status: DC

## 2015-12-12 MED ORDER — FLUCONAZOLE 150 MG PO TABS: ORAL_TABLET | ORAL | Status: DC

## 2015-12-12 NOTE — Progress Notes (Signed)
HPI: Jasmine Bullock is a 35 y.o. female who presents to [facility] today for chief complaint of:  Chief Complaint  Patient presents with  . Abdominal Pain    stomachache/ x 2 days  . Nausea    x 2 days  . Diarrhea    x 2 days  . Vaginitis    pt states she may have yeast infection  . other    pt would like print out presciptions     . Location: abdominal discomfort . Quality: diarrhea has resolved, threw up again last night . Severity: mild at this time, was worse . Duration: few days . Context: (+) sick contacts with similar symptoms which have resolved . Modifying factors: Ibuprofen, Pepto which made her throw up . Assoc signs/symptoms: vaginal itching, pt thinks yeast infection. Fever <100 at home.     Records reviewed: pt previously seen at [facility] in [place], [place] for primary care, ?out of medicines, refill request noted 11/29/15, last OV 03/26/15, on Celexa but not taking now. Pt loking to establish care w/ Dr Katrinka Blazing here, needs to schedule physical.   Had "double pneumonia" in 09/2015, given albuterol but she never used this. Lingering cough since then.   Past medical, social and family history reviewed: Past Medical History  Diagnosis Date  . Anxiety   . Depression    History reviewed. No pertinent past surgical history. Social History  Substance Use Topics  . Smoking status: Never Smoker   . Smokeless tobacco: Not on file  . Alcohol Use: 2.4 oz/week    4 Standard drinks or equivalent per week   Family History  Problem Relation Age of Onset  . Hypertension Mother   . Cancer Maternal Grandmother   . High Cholesterol Paternal Grandmother   . Diabetes Paternal Grandfather     Current Outpatient Prescriptions  Medication Sig Dispense Refill  . PRENATAL 28-0.8 MG TABS Take by mouth.    Marland Kitchen albuterol (PROVENTIL HFA;VENTOLIN HFA) 108 (90 Base) MCG/ACT inhaler Inhale 2 puffs into the lungs every 6 (six) hours as needed for wheezing or shortness of  breath (cough, shortness of breath or wheezing.). (Patient not taking: Reported on 12/12/2015) 1 Inhaler 1  . citalopram (CELEXA) 10 MG tablet TAKE 3 TABLETS BY MOUTH DAILY (Patient not taking: Reported on 12/12/2015) 90 tablet 0  . fluconazole (DIFLUCAN) 150 MG tablet Take 1 tablet (150 mg total) by mouth once, repeat in 72 hours if symptoms persist. 2 tablet 1  . guaiFENesin-codeine 100-10 MG/5ML syrup Take 5 mLs by mouth every 6 (six) hours as needed for cough. (Patient not taking: Reported on 10/05/2015) 120 mL 0  . ondansetron (ZOFRAN-ODT) 4 MG disintegrating tablet Take 2 tablets (8 mg total) by mouth once. 30 tablet 1   No current facility-administered medications for this visit.   No Known Allergies    Review of Systems: CONSTITUTIONAL:  (+) "low grade" fever, no chills, No  unintentional weight changes HEAD/EYES/EARS/NOSE/THROAT: No  headache, no vision change CARDIAC: No  chest pain RESPIRATORY: (+) occasional cough, No  shortness of breath/wheeze GASTROINTESTINAL: (+) nausea, (+) vomiting x1, (+) mild abdominal pain, No  blood in stool, (+) diarrhea now resolved, No  constipation  MUSCULOSKELETAL: No  myalgia/arthralgia GENITOURINARY: No  incontinence, No  abnormal genital bleeding/discharge, (+ )vaginal itching  Exam:  BP 118/69 mmHg  Pulse 69  Temp(Src) 98.6 F (37 C) (Oral)  Resp 16  Ht  (1.702 m)  Wt 166 lb  6.4 oz (75.479 kg)  BMI 26.06 kg/m2  SpO2 99%  LMP 11/29/2015 Constitutional: VS see above. General Appearance: alert, well-developed, well-nourished, NAD Eyes: Normal lids and conjunctive, non-icteric sclera,  Ears, Nose, Mouth, Throat: MMM, Normal external inspection ears/nares/mouth/lips/gums, normal pharynx Neck: No masses, trachea midline. No thyroid enlargement/tenderness/mass appreciated. No lymphadenopathy Respiratory: Normal respiratory effort. no wheeze, no rhonchi, no rales Cardiovascular: S1/S2 normal, no murmur, no rub/gallop auscultated. RRR.   Gastrointestinal: Nontender, no masses. No hepatomegaly, no splenomegaly. No hernia appreciated. Bowel sounds normal. Rectal exam deferred.  Musculoskeletal: Gait normal. No clubbing/cyanosis of digits.  Skin: warm, dry, intact.  Psychiatric: Normal judgment/insight.    Results for orders placed or performed in visit on 12/12/15 (from the past 72 hour(s))  POCT urine pregnancy     Status: None   Collection Time: 12/12/15 12:11 PM  Result Value Ref Range   Preg Test, Ur Negative Negative     ASSESSMENT/PLAN: Most likely viral gastritis, symptoms appear to be resolving except for nausea, we'll treat with Zofran. Diflucan given for vaginal candida infection.  Nausea without vomiting - Plan: POCT urine pregnancy, ondansetron (ZOFRAN-ODT) 4 MG disintegrating tablet  Abdominal discomfort - Plan: POCT urine pregnancy  Viral gastritis - Plan: ondansetron (ZOFRAN-ODT) 4 MG disintegrating tablet  Vaginal yeast infection - Plan: fluconazole (DIFLUCAN) 150 MG tablet   Visit summary printed and instructions reviewed with the patient. All questions answered. Return if symptoms worsen or fail to improve, and at your convenience to establish primary care.

## 2015-12-12 NOTE — Patient Instructions (Addendum)
Symptoms are most likely due to a viral gastroenteritis, most of the time this resolves without any issues, okay to take Pepto-Bismol or Imodium over-the-counter, you have been given a prescription for nausea medication. You have also been given a prescription for vaginal yeast infection, this may require further testing if any itching/discharge persists. If symptoms persist or worsen, please come see us. You are encouraged to set up an annual wellness exam/establish care visit with Dr. Katrinka BlazingSmith at your convenience.     IF you received an x-ray today, you will receive an invoice from [facility]. Please contact [facility] at [phone] with questions or concerns regarding your invoice.   IF you received labwork today, you will receive an invoice from [facility]. Please contact [facility] at [phone] with questions or concerns regarding your invoice.   Our billing staff will not be able to assist you with questions regarding bills from these companies.  You will be contacted with the lab results as soon as they are available. The fastest way to get your results is to activate your My Chart account. Instructions are located on the last page of this paperwork. If you have not heard from us regarding the results in 2 weeks, please contact this office.

## 2015-12-21 ENCOUNTER — Ambulatory Visit (INDEPENDENT_AMBULATORY_CARE_PROVIDER_SITE_OTHER): Payer: BLUE CROSS/BLUE SHIELD | Admitting: Family Medicine

## 2015-12-21 ENCOUNTER — Telehealth: Payer: Self-pay

## 2015-12-21 VITALS — BP 108/72 | HR 65 | Temp 99.3°F | Resp 16 | Ht 66.5 in | Wt 167.0 lb

## 2015-12-21 DIAGNOSIS — N898 Other specified noninflammatory disorders of vagina: Secondary | ICD-10-CM | POA: Diagnosis not present

## 2015-12-21 DIAGNOSIS — N949 Unspecified condition associated with female genital organs and menstrual cycle: Secondary | ICD-10-CM

## 2015-12-21 LAB — POC MICROSCOPIC URINALYSIS (UMFC): MUCUS RE: ABSENT

## 2015-12-21 LAB — POCT URINALYSIS DIP (MANUAL ENTRY)
BILIRUBIN UA: NEGATIVE
Bilirubin, UA: NEGATIVE
Blood, UA: NEGATIVE
GLUCOSE UA: NEGATIVE
Nitrite, UA: NEGATIVE
Protein Ur, POC: NEGATIVE
SPEC GRAV UA: 1.015
Urobilinogen, UA: 0.2
pH, UA: 6.5

## 2015-12-21 LAB — POCT WET + KOH PREP
Trich by wet prep: ABSENT
YEAST BY WET PREP: ABSENT
Yeast by KOH: ABSENT

## 2015-12-21 MED ORDER — FLUCONAZOLE 150 MG PO TABS: ORAL_TABLET | ORAL | Status: DC

## 2015-12-21 MED ORDER — METRONIDAZOLE 500 MG PO TABS: 500.0000 mg | ORAL_TABLET | Freq: Two times a day (BID) | ORAL | Status: DC

## 2015-12-21 NOTE — Telephone Encounter (Signed)
Called pt, unable to leave voicemail

## 2015-12-21 NOTE — Telephone Encounter (Signed)
Pt was seen on 12-12-15 and given medicine for yeast infection.  She finished the pills and has been using over the counter treatments, but still has it.  Can we call her in something else?  [phone]

## 2015-12-21 NOTE — Telephone Encounter (Signed)
Pt advised to RTC. Pt agreed.

## 2015-12-21 NOTE — Telephone Encounter (Signed)
Looks like she was not tested here for a yeast infection. Since she is not better with treatments for yeast, she needs to RTC for further evaluation to determine if there could be another cause.

## 2015-12-21 NOTE — Progress Notes (Signed)
Patient ID: Jasmine Bullock, female    DOB: [birth year]  Age: 35 y.o. MRN: [id number]  Chief Complaint  Patient presents with  . Follow-up    yeast infection, still having problems    Subjective:   Patient was here last week and treated empirically for a probable yeast vaginitis that she had had multiple yeast vaginitis problems in the past. She took the Diflucan, and took it again a few days later. She continues to have some burning and irritation around the introitus. Has been using some OTC yeast cream. She still has a creamy discharge. No odor. Last menstrual cycle was 2-1/2 weeks ago. She is trying to get pregnant, unsuccessfully over the last 18 months. Has one young child.  Current allergies, medications, problem list, past/family and social histories reviewed.  Objective:  BP 108/72 mmHg  Pulse 65  Temp(Src) 99.3 F (37.4 C)  Resp 16  Ht 5' 6.5" (1.689 m)  Wt 167 lb (75.751 kg)  BMI 26.55 kg/m2  SpO2 99%  LMP 11/29/2015  No major acute distress. Normal external genitalia. Minimal erythema. Scar tissue visible from prior episiotomy. Introitus was normal. Vaginal mucosa not inflamed. Cervix appears normal. Has a slightly milky gel-like discharge.  Assessment & Plan:   Assessment: 1. Vaginal discharge   2. Vaginal burning       Plan: Wet prep and urinalysis  Orders Placed This Encounter  Procedures  . POCT Wet + KOH Prep  . POCT urinalysis dipstick  . POCT Microscopic Urinalysis ([facility])   Results for orders placed or performed in visit on 12/21/15  POCT Wet + KOH Prep  Result Value Ref Range   Yeast by KOH Absent Present, Absent   Yeast by wet prep Absent Present, Absent   WBC by wet prep Moderate (A) None, Few, Too numerous to count   Clue Cells Wet Prep HPF POC None None, Too numerous to count   Trich by wet prep Absent Present, Absent   Bacteria Wet Prep HPF POC Few None, Few, Too numerous to count   Epithelial Cells By [employer] Pref ([facility]) Moderate (A) None, Few,  Too numerous to count   RBC,UR,HPF,POC Few (A) None RBC/hpf  POCT urinalysis dipstick  Result Value Ref Range   Color, UA yellow yellow   Clarity, UA clear clear   Glucose, UA negative negative   Bilirubin, UA negative negative   Ketones, POC UA negative negative   Spec Grav, UA 1.015    Blood, UA negative negative   pH, UA 6.5    Protein Ur, POC negative negative   Urobilinogen, UA 0.2    Nitrite, UA Negative Negative   Leukocytes, UA Trace (A) Negative  POCT Microscopic Urinalysis ([facility])  Result Value Ref Range   WBC,UR,HPF,POC None None WBC/hpf   RBC,UR,HPF,POC None None RBC/hpf   Bacteria None None, Too numerous to count   Mucus Absent Absent   Epithelial Cells, UR Per Microscopy Few (A) None, Too numerous to count cells/hpf    No orders of the defined types were placed in this encounter.    No evidence of a vaginitis. This appears to be a nonspecific discharge. [place] from the introitus. I think using just some diaper creams would be the best thing for her. If she keeps having problems she may need to be rechecked, but I gave her medicine to take one empiric course of Diflucan and Flagyl if symptoms don't improve. I would advise her to wait until after her next menstrual cycle before  she continues taking anything else. If after the round of empiric treatment she still complains, would probably send her to a gynecologist.   Patient Instructions       IF you received an x-ray today, you will receive an invoice from [facility]. Please contact [facility] at [phone] with questions or concerns regarding your invoice.   IF you received labwork today, you will receive an invoice from [facility]. Please contact [facility] at [phone] with questions or concerns regarding your invoice.   Our billing staff will not be able to assist you with questions regarding bills from these companies.  You will be contacted with the lab  results as soon as they are available. The fastest way to get your results is to activate your My Chart account. Instructions are located on the last page of this paperwork. If you have not heard from us regarding the results in 2 weeks, please contact this office.         No Follow-up on file.   HOPPER,DAVID, MD 12/21/2015

## 2015-12-21 NOTE — Patient Instructions (Addendum)
I recommend using a diaper cream or lotion or ointment such as A and D ointment for the irritation.  Try to wait through the next cycle. If you continue having a discharge then I would go ahead and take the empiric treatment for several different types of vaginitis including taking metronidazole 500 mg one twice daily and a single dose of Diflucan. Do not drink any alcohol when taking the metronidazole or it will cause vomiting.  Return if problems persist.   IF you received an x-ray today, you will receive an invoice from [facility]. Please contact [facility] at [phone] with questions or concerns regarding your invoice.   IF you received labwork today, you will receive an invoice from [facility]. Please contact [facility] at [phone] with questions or concerns regarding your invoice.   Our billing staff will not be able to assist you with questions regarding bills from these companies.  You will be contacted with the lab results as soon as they are available. The fastest way to get your results is to activate your My Chart account. Instructions are located on the last page of this paperwork. If you have not heard from us regarding the results in 2 weeks, please contact this office.

## 2016-05-16 ENCOUNTER — Ambulatory Visit (INDEPENDENT_AMBULATORY_CARE_PROVIDER_SITE_OTHER): Payer: Managed Care, Other (non HMO) | Admitting: Physician Assistant

## 2016-05-16 VITALS — BP 120/70 | HR 57 | Temp 98.4°F | Resp 16 | Ht 67.0 in | Wt 166.0 lb

## 2016-05-16 DIAGNOSIS — Z23 Encounter for immunization: Secondary | ICD-10-CM | POA: Diagnosis not present

## 2016-05-16 DIAGNOSIS — H6123 Impacted cerumen, bilateral: Secondary | ICD-10-CM

## 2016-05-16 NOTE — Progress Notes (Signed)
   Jasmine Bullock  MRN: [id number] DOB: [birth year]  PCP: No PCP Per Patient  Subjective:  Pt is a 35 year old female presents to clinic for decreased hearing right ear x 3 weeks. She has tried over the counter Debrox ear wax removal 4x's a day for the past three days, no relief.  Both ears feel stopped up, right is worse. Decreased hearing in right ear. Minor ear pain right ear last week.  No recent illness, fever, ear drainage, chills, jaw pain.   Review of Systems  Constitutional: Negative for chills, diaphoresis, fatigue and fever.  HENT: Positive for congestion and ear pain (right). Negative for dental problem, ear discharge, rhinorrhea and sore throat.   Eyes: Negative.   Cardiovascular: Negative.   Neurological: Negative for dizziness, syncope and headaches.    Patient Active Problem List   Diagnosis Date Noted  . Viral gastritis 12/12/2015    Current Outpatient Prescriptions on File Prior to Visit  Medication Sig Dispense Refill  . PRENATAL 28-0.8 MG TABS Take by mouth. Reported on 12/21/2015    . albuterol (PROVENTIL HFA;VENTOLIN HFA) 108 (90 Base) MCG/ACT inhaler Inhale 2 puffs into the lungs every 6 (six) hours as needed for wheezing or shortness of breath (cough, shortness of breath or wheezing.). (Patient not taking: Reported on 12/12/2015) 1 Inhaler 1  . citalopram (CELEXA) 10 MG tablet TAKE 3 TABLETS BY MOUTH DAILY (Patient not taking: Reported on 12/12/2015) 90 tablet 0   No current facility-administered medications on file prior to visit.     No Known Allergies  Objective:  BP 120/70 (BP Location: Right Arm, Patient Position: Sitting, Cuff Size: Normal)   Pulse (!) 57   Temp 98.4 F (36.9 C) (Oral)   Resp 16   Ht 5\' 7"  (1.702 m)   Wt 166 lb (75.3 kg)   SpO2 100%   BMI 26.00 kg/m   Physical Exam  Constitutional: She is oriented to person, place, and time and well-developed, well-nourished, and in no distress. No distress.  HENT:  Right Ear: External ear  normal. No drainage or tenderness. No mastoid tenderness.  Left Ear: External ear normal. No drainage or tenderness. No mastoid tenderness.  TM not visualized due to cerumen impaction b/l.   Cardiovascular: Normal rate, regular rhythm and normal heart sounds.   Pulmonary/Chest: Effort normal. No respiratory distress.  Neurological: She is alert and oriented to person, place, and time. GCS score is 15.  Skin: Skin is warm and dry.  Psychiatric: Mood, memory, affect and judgment normal.  Vitals reviewed.  Procedure Cerumen disimpaction b/l  Assessment and Plan :  1. Cerumen impaction, bilateral - Ear wax removal 2. Flu vaccine need - Flu Vaccine QUAD 36+ mos IM   Marco CollieWhitney Kariya Lavergne, PA-C  [facility] 05/16/2016 3:00 PM

## 2016-05-16 NOTE — Patient Instructions (Addendum)
     IF you received an x-ray today, you will receive an invoice from [facility]. Please contact [facility] at [phone] with questions or concerns regarding your invoice.   IF you received labwork today, you will receive an invoice from [facility]. Please contact [facility] at [phone] with questions or concerns regarding your invoice.   Our billing staff will not be able to assist you with questions regarding bills from these companies.  You will be contacted with the lab results as soon as they are available. The fastest way to get your results is to activate your My Chart account. Instructions are located on the last page of this paperwork. If you have not heard from us regarding the results in 2 weeks, please contact this office.    Cerumen Impaction The structures of the external ear canal secrete a waxy substance known as cerumen. Excess cerumen can build up in the ear canal, causing a condition known as cerumen impaction. Cerumen impaction can cause ear pain and disrupt the function of the ear. The rate of cerumen production differs for each individual. In certain individuals, the configuration of the ear canal may decrease his or her ability to naturally remove cerumen. CAUSES Cerumen impaction is caused by excessive cerumen production or buildup. RISK FACTORS  Frequent use of swabs to clean ears.  Having narrow ear canals.  Having eczema.  Being dehydrated. SIGNS AND SYMPTOMS  Diminished hearing.  Ear drainage.  Ear pain.  Ear itch. TREATMENT Treatment may involve:  Over-the-counter or prescription ear drops to soften the cerumen.  Removal of cerumen by a health care provider. This may be done with:  Irrigation with warm water. This is the most common method of removal.  Ear curettes and other instruments.  Surgery. This may be done in severe cases. HOME CARE INSTRUCTIONS  Take medicines only as directed by  your health care provider.  Do not insert objects into the ear with the intent of cleaning the ear. PREVENTION  Do not insert objects into the ear, even with the intent of cleaning the ear. Removing cerumen as a part of normal hygiene is not necessary, and the use of swabs in the ear canal is not recommended.  Drink enough water to keep your urine clear or pale yellow.  Control your eczema if you have it. SEEK MEDICAL CARE IF:  You develop ear pain.  You develop bleeding from the ear.  The cerumen does not clear after you use ear drops as directed.   This information is not intended to replace advice given to you by your health care provider. Make sure you discuss any questions you have with your health care provider.   Document Released: 10/05/2004 Document Revised: 09/18/2014 Document Reviewed: 04/14/2015 Elsevier Interactive Patient Education 2016 Elsevier Inc.  

## 2016-06-06 ENCOUNTER — Encounter: Payer: Self-pay | Admitting: Family Medicine

## 2016-06-06 ENCOUNTER — Ambulatory Visit (INDEPENDENT_AMBULATORY_CARE_PROVIDER_SITE_OTHER): Payer: Managed Care, Other (non HMO) | Admitting: Family Medicine

## 2016-06-06 VITALS — BP 100/72 | HR 68 | Temp 97.9°F | Ht 67.0 in | Wt 166.2 lb

## 2016-06-06 DIAGNOSIS — Z131 Encounter for screening for diabetes mellitus: Secondary | ICD-10-CM | POA: Diagnosis not present

## 2016-06-06 DIAGNOSIS — F419 Anxiety disorder, unspecified: Secondary | ICD-10-CM

## 2016-06-06 DIAGNOSIS — F418 Other specified anxiety disorders: Secondary | ICD-10-CM | POA: Diagnosis not present

## 2016-06-06 DIAGNOSIS — Z0000 Encounter for general adult medical examination without abnormal findings: Secondary | ICD-10-CM | POA: Diagnosis not present

## 2016-06-06 DIAGNOSIS — Z13220 Encounter for screening for lipoid disorders: Secondary | ICD-10-CM | POA: Diagnosis not present

## 2016-06-06 DIAGNOSIS — F329 Major depressive disorder, single episode, unspecified: Secondary | ICD-10-CM

## 2016-06-06 LAB — COMPREHENSIVE METABOLIC PANEL
ALBUMIN: 4.1 g/dL (ref 3.6–5.1)
ALK PHOS: 34 U/L (ref 33–115)
ALT: 9 U/L (ref 6–29)
AST: 15 U/L (ref 10–30)
BILIRUBIN TOTAL: 0.7 mg/dL (ref 0.2–1.2)
BUN: 12 mg/dL (ref 7–25)
CALCIUM: 8.8 mg/dL (ref 8.6–10.2)
CO2: 25 mmol/L (ref 20–31)
CREATININE: 0.65 mg/dL (ref 0.50–1.10)
Chloride: 104 mmol/L (ref 98–110)
Glucose, Bld: 88 mg/dL (ref 65–99)
Potassium: 4.1 mmol/L (ref 3.5–5.3)
SODIUM: 137 mmol/L (ref 135–146)
TOTAL PROTEIN: 6.5 g/dL (ref 6.1–8.1)

## 2016-06-06 LAB — LIPID PANEL
CHOLESTEROL: 151 mg/dL (ref 125–200)
HDL: 73 mg/dL (ref >=46)
LDL Cholesterol: 70 mg/dL (ref <130)
TRIGLYCERIDES: 38 mg/dL (ref <150)
Total CHOL/HDL Ratio: 2.1 Ratio (ref <=5.0)
VLDL: 8 mg/dL (ref <30)

## 2016-06-06 LAB — CBC WITH DIFFERENTIAL/PLATELET
Basophils Absolute: 0 cells/uL (ref 0–200)
Basophils Relative: 0 %
EOS PCT: 1 %
Eosinophils Absolute: 76 cells/uL (ref 15–500)
HEMATOCRIT: 38.5 % (ref 35.0–45.0)
HEMOGLOBIN: 13.4 g/dL (ref 11.7–15.5)
LYMPHS ABS: 1900 {cells}/uL (ref 850–3900)
Lymphocytes Relative: 25 %
MCH: 32.3 pg (ref 27.0–33.0)
MCHC: 34.8 g/dL (ref 32.0–36.0)
MCV: 92.8 fL (ref 80.0–100.0)
MONO ABS: 380 {cells}/uL (ref 200–950)
MPV: 11 fL (ref 7.5–12.5)
Monocytes Relative: 5 %
NEUTROS ABS: 5244 {cells}/uL (ref 1500–7800)
NEUTROS PCT: 69 %
Platelets: 248 10*3/uL (ref 140–400)
RBC: 4.15 MIL/uL (ref 3.80–5.10)
RDW: 12.2 % (ref 11.0–15.0)
WBC: 7.6 10*3/uL (ref 3.8–10.8)

## 2016-06-06 LAB — POCT URINALYSIS DIP (MANUAL ENTRY)
Bilirubin, UA: NEGATIVE
GLUCOSE UA: NEGATIVE
Ketones, POC UA: NEGATIVE
Leukocytes, UA: NEGATIVE
Nitrite, UA: NEGATIVE
PROTEIN UA: NEGATIVE
RBC UA: NEGATIVE
SPEC GRAV UA: 1.01
UROBILINOGEN UA: 0.2
pH, UA: 7.5

## 2016-06-06 LAB — VITAMIN B12: Vitamin B-12: 464 pg/mL (ref 200–1100)

## 2016-06-06 NOTE — Patient Instructions (Addendum)
   IF you received an x-ray today, you will receive an invoice from [facility]. Please contact [facility] at [phone] with questions or concerns regarding your invoice.   IF you received labwork today, you will receive an invoice from [facility]. Please contact [facility] at [phone] with questions or concerns regarding your invoice.   Our billing staff will not be able to assist you with questions regarding bills from these companies.  You will be contacted with the lab results as soon as they are available. The fastest way to get your results is to activate your My Chart account. Instructions are located on the last page of this paperwork. If you have not heard from us regarding the results in 2 weeks, please contact this office.     Keeping You Healthy  Get These Tests 1. Blood Pressure- Have your blood pressure checked once a year by your health care provider.  Normal blood pressure is 120/80. 2. Weight- Have your body mass index (BMI) calculated to screen for obesity.  BMI is measure of body fat based on height and weight.  You can also calculate your own BMI at www.nhlbisupport.com/bmi/. 3. Cholesterol- Have your cholesterol checked every 5 years starting at age 20 then yearly starting at age 45. 4. Chlamydia, HIV, and other sexually transmitted diseases- Get screened every year until age 25, then within three months of each new sexual provider. 5. Pap Test - Every 1-5 years; discuss with your health care provider. 6. Mammogram- Every 1-2 years starting at age 40--50  Take these medicines  Calcium with Vitamin D-Your body needs 1200 mg of Calcium each day and 800-1000 IU of Vitamin D daily.  Your body can only absorb 500 mg of Calcium at a time so Calcium must be taken in 2 or 3 divided doses throughout the day.  Multivitamin with folic acid- Once daily if it is possible for you to become pregnant.  Get these  Immunizations  Gardasil-Series of three doses; prevents HPV related illness such as genital warts and cervical cancer.  Menactra-Single dose; prevents meningitis.  Tetanus shot- Every 10 years.  Flu shot-Every year.  Take these steps 1. Do not smoke-Your healthcare provider can help you quit.  For tips on how to quit go to www.smokefree.gov or call 1-800 QUITNOW. 2. Be physically active- Exercise 5 days a week for at least 30 minutes.  If you are not already physically active, start slow and gradually work up to 30 minutes of moderate physical activity.  Examples of moderate activity include walking briskly, dancing, swimming, bicycling, etc. 3. Breast Cancer- A self breast exam every month is important for early detection of breast cancer.  For more information and instruction on self breast exams, ask your healthcare provider or www.womenshealth.gov/faq/breast-self-exam.cfm. 4. Eat a healthy diet- Eat a variety of healthy foods such as fruits, vegetables, whole grains, low fat milk, low fat cheeses, yogurt, lean meats, poultry and fish, beans, nuts, tofu, etc.  For more information go to www. Thenutritionsource.org 5. Drink alcohol in moderation- Limit alcohol intake to one drink or less per day. Never drink and drive. 6. Depression- Your emotional health is as important as your physical health.  If you're feeling down or losing interest in things you normally enjoy please talk to your healthcare provider about being screened for depression. 7. Dental visit- Brush and floss your teeth twice daily; visit your dentist twice a year. 8. Eye doctor- Get an eye exam at least every   2 years. 9. Helmet use- Always wear a helmet when riding a bicycle, motorcycle, rollerblading or skateboarding. 10. Safe sex- If you may be exposed to sexually transmitted infections, use a condom. 11. Seat belts- Seat belts can save your live; always wear one. 12. Smoke/Carbon Monoxide detectors- These detectors need to  be installed on the appropriate level of your home. Replace batteries at least once a year. 13. Skin cancer- When out in the sun please cover up and use sunscreen 15 SPF or higher. 14. Violence- If anyone is threatening or hurting you, please tell your healthcare provider.        

## 2016-06-06 NOTE — Progress Notes (Signed)
Patient ID: Jasmine Bullock, female   DOB: 1981/02/24, 35 y.o.   MRN: [id number]   Subjective:  By signing my name below, I, Essence Howell, attest that this documentation has been prepared under the direction and in the presence of Reginia Forts, MD Electronically Signed: Ladene Artist, ED Scribe 06/06/2016 at 9:46 AM.   Patient ID: Jasmine Bullock, female    DOB: 06/25/1981, 35 y.o.   MRN: [id number]  06/06/2016  Annual Exam ("fasting", no pap)  HPI HPI Comments: Jasmine Bullock is a 35 y.o. female who presents to the [facility] for an annual exam. Pt is fasting at this visit. Pt's last CPE was approximately 2 years ago. Her last pap smear was August 2016 by Dr. Lucia Bitter at [facility]. She had a mammogram done at that time for some dense breast tissue; results negative; repeat mammogram in six months negative.. Pt's last tetanus was 3 years ago. She has a flu vaccine a few weeks ago. No h/o corrective lenses. Pt works as a Copywriter, advertising. Her last dental visit was last month. She reports normal menstrual periods that last for 5 days. She is currently followed by Dr. Valentino Saxon, a fertility specialist and gynecologist; states that she has been trying to get pregnant for over a year. She was last seen by dermatology 1-2 years ago for moles and was told that they were fine.   Pt reports a h/o anxiety and depression which she had taking medication for since 2005 but states she recently stopped. Pt denies panic attacks being on any medications. She reports previous surgery on her wrist involving the tendon. Pt's mother is 20 y.o with a h/o HTN. Her father is 106 y.o with a pshx of cholecystectomy last year. Pt's sister is 15 y.o with no pertinent medical hx. Pt has been happily married for 8 years with a 74 y.o son and denies h/o domestic abuse. She is not currently working. She smoking socially only in college and alcohol consumption on the weekends. Pt currently runs 3-4 miles 3-4  times/week. She wears a seatbelt and does not text while driving. She is happy with her current weight of 166 but would like to lose 5 lbs. She denies tinnitus, HA, dizziness, blurred or double vision, mouth sores, neck swelling, chest pain, chest palpations, sob, constipation, blood in stools, melena, heartburn, nausea, vomiting, abdominal pain. She reports daily BMs. Pt denies getting up at night to urinate or leaky bladder during the day. Pt falls asleep around 11 PM and wakes up around 6:45 am. She denies difficulty falling asleep or staying asleep. Pt denies a familial h/o colon CA or breast CA but reports that her maternal grandmother passed from ovarian CA at age 38.   Depression screen [facility] 2/9 06/06/2016 05/16/2016 12/21/2015 12/12/2015 10/09/2015  Decreased Interest 0 0 0 0 0  Down, Depressed, Hopeless 0 0 0 0 0  PHQ - 2 Score 0 0 0 0 0    Review of Systems  Constitutional: Negative for activity change, appetite change, chills, diaphoresis, fatigue, fever and unexpected weight change.  HENT: Negative for congestion, dental problem, drooling, ear discharge, ear pain, facial swelling, hearing loss, mouth sores, nosebleeds, postnasal drip, rhinorrhea, sinus pressure, sneezing, sore throat, tinnitus, trouble swallowing and voice change.   Eyes: Negative for photophobia, pain, discharge, redness, itching and visual disturbance.  Respiratory: Negative for apnea, cough, choking, chest tightness, shortness of breath, wheezing and stridor.   Cardiovascular: Negative for chest pain,  palpitations and leg swelling.  Gastrointestinal: Negative for abdominal distention, abdominal pain, anal bleeding, blood in stool, constipation, diarrhea, nausea, rectal pain and vomiting.  Endocrine: Negative for cold intolerance, heat intolerance, polydipsia, polyphagia and polyuria.  Genitourinary: Negative for decreased urine volume, difficulty urinating, dyspareunia, dysuria, enuresis, flank pain, frequency, genital sores,  hematuria, menstrual problem, pelvic pain, urgency, vaginal bleeding, vaginal discharge and vaginal pain.  Musculoskeletal: Negative for arthralgias, back pain, gait problem, joint swelling, myalgias, neck pain and neck stiffness.  Skin: Negative for color change, pallor, rash and wound.  Allergic/Immunologic: Negative for environmental allergies, food allergies and immunocompromised state.  Neurological: Negative for dizziness, tremors, seizures, syncope, facial asymmetry, speech difficulty, weakness, light-headedness, numbness and headaches.  Hematological: Negative for adenopathy. Does not bruise/bleed easily.  Psychiatric/Behavioral: Negative for agitation, behavioral problems, confusion, decreased concentration, dysphoric mood, hallucinations, self-injury, sleep disturbance and suicidal ideas. The patient is not nervous/anxious and is not hyperactive.     Past Medical History:  Diagnosis Date  . Allergy   . Anxiety   . Asthma    childhood  . Depression    Past Surgical History:  Procedure Laterality Date  . L wrist surgery     No Known Allergies Current Outpatient Prescriptions  Medication Sig Dispense Refill  . PRENATAL 28-0.8 MG TABS Take by mouth. Reported on 12/21/2015    . albuterol (PROVENTIL HFA;VENTOLIN HFA) 108 (90 Base) MCG/ACT inhaler Inhale 2 puffs into the lungs every 6 (six) hours as needed for wheezing or shortness of breath (cough, shortness of breath or wheezing.). (Patient not taking: Reported on 06/06/2016) 1 Inhaler 1   No current facility-administered medications for this visit.    Social History   Social History  . Marital status: Married    Spouse name: N/A  . Number of children: 1  . Years of education: N/A   Occupational History  . homemaker    Social History Main Topics  . Smoking status: Never Smoker  . Smokeless tobacco: Never Used  . Alcohol use 1.8 oz/week    3 Standard drinks or equivalent per week  . Drug use: No  . Sexual activity: Yes    Other Topics Concern  . 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 Hypertension Mother   . Cancer Maternal Grandmother     Ovarian  . Alcohol abuse Maternal Grandfather   . High Cholesterol Paternal Grandmother   . Hypertension Paternal Grandmother   . Diabetes Paternal Grandfather   . Heart disease Paternal Grandfather        Objective:    BP 100/72   Pulse 68   Temp 97.9 F (36.6 C) (Oral)   Ht 5' 7"  (1.702 m)   Wt 166 lb 3.2 oz (75.4 kg)   LMP 05/23/2016   BMI 26.03 kg/m  Physical Exam  Constitutional: She is oriented to person, place, and time. She appears well-developed and well-nourished. No distress.  HENT:  Head:  Normocephalic and atraumatic.  Right Ear: Tympanic membrane and external ear normal.  Left Ear: Tympanic membrane and external ear normal.  Nose: Nose normal.  Mouth/Throat: Oropharynx is clear and moist.  Eyes: Conjunctivae and EOM are normal. Pupils are equal, round, and reactive to light.  Neck: Normal range of motion and full passive range of motion without pain. Neck supple. No JVD present. Carotid bruit is not present. No thyromegaly present.  Cardiovascular: Normal rate, regular rhythm, normal heart sounds and intact distal pulses.  Exam reveals no gallop and no friction rub.   No murmur heard. Pulmonary/Chest: Effort normal and breath sounds normal. She has no wheezes. She has no rales.  Abdominal: Soft. Bowel sounds are normal. She exhibits no distension and no mass. There is no tenderness. There is no rebound and no guarding.  Musculoskeletal:       Right  shoulder: Normal.       Left shoulder: Normal.       Cervical back: Normal.  Lymphadenopathy:    She has no cervical adenopathy.  Neurological: She is alert and oriented to person, place, and time. She has normal reflexes. No cranial nerve deficit. She exhibits normal muscle tone. Coordination normal.  Skin: Skin is warm and dry. No rash noted. She is not diaphoretic. No erythema. No pallor.  Psychiatric: She has a normal mood and affect. Her behavior is normal. Judgment and thought content normal.  Nursing note and vitals reviewed.  Results for orders placed or performed in visit on 06/06/16  CBC with Differential/Platelet  Result Value Ref Range   WBC 7.6 3.8 - 10.8 K/uL   RBC 4.15 3.80 - 5.10 MIL/uL   Hemoglobin 13.4 11.7 - 15.5 g/dL   HCT 38.5 35.0 - 45.0 %   MCV 92.8 80.0 - 100.0 fL   MCH 32.3 27.0 - 33.0 pg   MCHC 34.8 32.0 - 36.0 g/dL   RDW 12.2 11.0 - 15.0 %   Platelets 248 140 - 400 K/uL   MPV 11.0 7.5 - 12.5 fL   Neutro Abs 5,244 1,500 - 7,800 cells/uL   Lymphs Abs 1,900 850 - 3,900 cells/uL   Monocytes Absolute 380 200 - 950 cells/uL   Eosinophils Absolute 76 15 - 500 cells/uL   Basophils Absolute 0 0 - 200 cells/uL   Neutrophils Relative % 69 %   Lymphocytes Relative 25 %   Monocytes Relative 5 %   Eosinophils Relative 1 %   Basophils Relative 0 %   Smear Review Criteria for review not met   Comprehensive metabolic panel  Result Value Ref Range   Sodium 137 135 - 146 mmol/L   Potassium 4.1 3.5 - 5.3 mmol/L   Chloride 104 98 - 110 mmol/L   CO2 25 20 - 31 mmol/L   Glucose, Bld 88 65 - 99 mg/dL   BUN 12 7 - 25 mg/dL   Creat 0.65 0.50 - 1.10 mg/dL   Total Bilirubin 0.7 0.2 - 1.2 mg/dL   Alkaline Phosphatase 34 33 - 115 U/L   AST 15 10 - 30 U/L   ALT 9 6 - 29 U/L   Total Protein 6.5 6.1 - 8.1 g/dL   Albumin 4.1 3.6 - 5.1 g/dL   Calcium 8.8 8.6 - 10.2 mg/dL  Lipid panel  Result Value Ref Range   Cholesterol 151 125 - 200 mg/dL   Triglycerides 38 <150 mg/dL     HDL 73 >=46 mg/dL   Total CHOL/HDL Ratio 2.1 <=5.0 Ratio  VLDL 8 <30 mg/dL   LDL Cholesterol 70 <130 mg/dL  Hemoglobin A1c  Result Value Ref Range   Hgb A1c MFr Bld  <5.7 %   Mean Plasma Glucose  mg/dL  Vitamin B12  Result Value Ref Range   Vitamin B-12 464 200 - 1,100 pg/mL  VITAMIN D 25 Hydroxy (Vit-D Deficiency, Fractures)  Result Value Ref Range   Vit D, 25-Hydroxy  30 - 100 ng/mL  POCT urinalysis dipstick  Result Value Ref Range   Color, UA yellow yellow   Clarity, UA cloudy (A) clear   Glucose, UA negative negative   Bilirubin, UA negative negative   Ketones, POC UA negative negative   Spec Grav, UA 1.010    Blood, UA negative negative   pH, UA 7.5    Protein Ur, POC negative negative   Urobilinogen, UA 0.2    Nitrite, UA Negative Negative   Leukocytes, UA Negative Negative       Assessment & Plan:   1. Routine physical examination   2. Screening for diabetes mellitus   3. Screening, lipid   4. Anxiety and depression    -anticipatory guidance provided --- recommend 5 pound weight loss. -undergoing infertility evaluation currently by Dr. Pamala Hurry. -pap smear UTD.   -influenza vaccine UTD. -recommend Hepatitis A series in the future.  -obtain age appropriate screening labs. -has weaned off of Citalopram and doing well emotionally; recommend monitoring mood closely over the upcoming 6-12 months.   Orders Placed This Encounter  Procedures  . CBC with Differential/Platelet  . Comprehensive metabolic panel    Order Specific Question:   Has the patient fasted?    Answer:   Yes  . Lipid panel    Order Specific Question:   Has the patient fasted?    Answer:   Yes  . Hemoglobin A1c  . Vitamin B12  . VITAMIN D 25 Hydroxy (Vit-D Deficiency, Fractures)  . Care order/instruction:    AVS and GO    Scheduling Instructions:     AVS and GO  . POCT urinalysis dipstick   No orders of the defined types were placed in this encounter.   Return for complete  physical examiniation.   I personally performed the services described in this documentation, which was scribed in my presence. The recorded information has been reviewed and considered.  Kristi Elayne Guerin, M.D. [facility] [address] [place], [place]  [phone] phone [phone] fax

## 2016-06-06 NOTE — Progress Notes (Signed)
   Subjective:    Patient ID: Jasmine Bullock, female    DOB: 07-03-1981, 35 y.o.   MRN: [id number]  HPI    Review of Systems  Constitutional: Negative.   HENT: Negative.   Eyes: Negative.   Respiratory: Negative.   Cardiovascular: Negative.   Gastrointestinal: Negative.   Endocrine: Negative.   Genitourinary: Negative.   Musculoskeletal: Negative.   Skin: Negative.   Allergic/Immunologic: Negative.   Neurological: Negative.   Hematological: Negative.   Psychiatric/Behavioral: Negative.        Objective:   Physical Exam        Assessment & Plan:

## 2016-06-07 LAB — HEMOGLOBIN A1C
HEMOGLOBIN A1C: 4.5 % (ref <5.7)
MEAN PLASMA GLUCOSE: 82 mg/dL

## 2016-06-07 LAB — VITAMIN D 25 HYDROXY (VIT D DEFICIENCY, FRACTURES): VIT D 25 HYDROXY: 35 ng/mL (ref 30–100)

## 2016-06-22 ENCOUNTER — Telehealth: Payer: Self-pay | Admitting: *Deleted

## 2016-06-22 NOTE — Telephone Encounter (Signed)
Patient would like to know lab results.  Advised her they were all within normal limits, but she noticed some were close to the high side.  Please advise.  [phone]

## 2016-06-24 NOTE — Telephone Encounter (Signed)
Call --- all labs are completely normal.  I am not concerned about any measurements/results.  What results concern her?

## 2016-06-26 NOTE — Telephone Encounter (Signed)
LMTRC jp/cma  

## 2016-06-26 NOTE — Telephone Encounter (Signed)
Patient aware of results via phone  

## 2016-07-17 ENCOUNTER — Ambulatory Visit: Payer: Managed Care, Other (non HMO)

## 2016-07-18 ENCOUNTER — Ambulatory Visit (INDEPENDENT_AMBULATORY_CARE_PROVIDER_SITE_OTHER): Payer: Managed Care, Other (non HMO) | Admitting: Physician Assistant

## 2016-07-18 ENCOUNTER — Encounter: Payer: Self-pay | Admitting: Physician Assistant

## 2016-07-18 VITALS — BP 114/74 | HR 70 | Temp 98.7°F | Resp 17 | Ht 67.0 in | Wt 160.0 lb

## 2016-07-18 DIAGNOSIS — R1013 Epigastric pain: Secondary | ICD-10-CM

## 2016-07-18 DIAGNOSIS — R12 Heartburn: Secondary | ICD-10-CM

## 2016-07-18 DIAGNOSIS — K2900 Acute gastritis without bleeding: Secondary | ICD-10-CM

## 2016-07-18 LAB — CBC WITH DIFFERENTIAL/PLATELET
BASOS ABS: 0 {cells}/uL (ref 0–200)
Basophils Relative: 0 %
Eosinophils Absolute: 68 cells/uL (ref 15–500)
Eosinophils Relative: 1 %
HEMATOCRIT: 43.5 % (ref 35.0–45.0)
Hemoglobin: 14.5 g/dL (ref 11.7–15.5)
LYMPHS PCT: 24 %
Lymphs Abs: 1632 cells/uL (ref 850–3900)
MCH: 31.6 pg (ref 27.0–33.0)
MCHC: 33.3 g/dL (ref 32.0–36.0)
MCV: 94.8 fL (ref 80.0–100.0)
MONO ABS: 476 {cells}/uL (ref 200–950)
MPV: 11.2 fL (ref 7.5–12.5)
Monocytes Relative: 7 %
NEUTROS PCT: 68 %
Neutro Abs: 4624 cells/uL (ref 1500–7800)
Platelets: 258 10*3/uL (ref 140–400)
RBC: 4.59 MIL/uL (ref 3.80–5.10)
RDW: 12.5 % (ref 11.0–15.0)
WBC: 6.8 10*3/uL (ref 3.8–10.8)

## 2016-07-18 LAB — COMPLETE METABOLIC PANEL WITH GFR
ALBUMIN: 4.3 g/dL (ref 3.6–5.1)
ALK PHOS: 36 U/L (ref 33–115)
ALT: 10 U/L (ref 6–29)
AST: 16 U/L (ref 10–30)
BUN: 13 mg/dL (ref 7–25)
CALCIUM: 9.1 mg/dL (ref 8.6–10.2)
CHLORIDE: 107 mmol/L (ref 98–110)
CO2: 24 mmol/L (ref 20–31)
Creat: 0.84 mg/dL (ref 0.50–1.10)
Glucose, Bld: 92 mg/dL (ref 65–99)
POTASSIUM: 4.7 mmol/L (ref 3.5–5.3)
Sodium: 139 mmol/L (ref 135–146)
Total Bilirubin: 0.5 mg/dL (ref 0.2–1.2)
Total Protein: 6.8 g/dL (ref 6.1–8.1)

## 2016-07-18 LAB — LIPASE: LIPASE: 17 U/L (ref 7–60)

## 2016-07-18 MED ORDER — RANITIDINE HCL 150 MG PO TABS: 150.0000 mg | ORAL_TABLET | Freq: Two times a day (BID) | ORAL | 1 refills | Status: DC

## 2016-07-18 MED ORDER — SUCRALFATE 1 GM/10ML PO SUSP: 1.0000 g | Freq: Three times a day (TID) | ORAL | 0 refills | Status: DC

## 2016-07-18 NOTE — Patient Instructions (Addendum)
Take the zantac 2x/day for 10 days to allow your stomach lining to heal then you can use it as needed Take the carafate which will coat your stomach to allow it to heal  In the future take motrin with a small amount of food    IF you received an x-ray today, you [facility]. Please contact [facility] at [phone] with questions or concerns regarding your invoice.   IF you received labwork today, you will receive an invoice from [facility]. Please contact [facility] at [phone] with questions or concerns regarding your invoice.   Our billing staff will not be able to assist you with questions regarding bills from these companies.  You will be contacted with the lab results as soon as they are available. The fastest way to get your results is to activate your My Chart account. Instructions are located on the last page of this paperwork. If you have not heard from us regarding the results in 2 weeks, please contact this office.

## 2016-07-18 NOTE — Progress Notes (Signed)
Jasmine Bullock  MRN: [id number] DOB: [birth year]  Subjective:  Pt presents to clinic with epigastric pain and heartburn that started 48h ago after she ate [place] food.  She can typically eat spicy foods without problems.  Her son did have GI illness last week and she has had 1 episode of diarrhea but this feels different than past Gi illness that she has had - the pain is upper mid epigastric area and burning and severe.  She did note that she took a lot of motrin the day before and the day that her symptoms started. She had bad menstrual cramps 400mg  every 4-5 hours due to her femara and her menses.  She has tried Zantac this am and that helped her symptoms.  She has tried pepto and not got a lot  of relief - she overall feels like she is better but she has never had heartburn last this long in the past.  Review of Systems  Constitutional: Negative for chills and fever.  Gastrointestinal: Positive for abdominal pain, diarrhea (1 episode) and vomiting (forced as she thought this would help her pain).  Genitourinary: Negative.     Patient Active Problem List   Diagnosis Date Noted  . Viral gastritis 12/12/2015    Current Outpatient Prescriptions on File Prior to Visit  Medication Sig Dispense Refill  . PRENATAL 28-0.8 MG TABS Take by mouth. Reported on 12/21/2015     No current facility-administered medications on file prior to visit.     No Known Allergies  Pt patients past, family and social history were reviewed and updated.   Objective:  BP 114/74 (BP Location: Right Arm, Patient Position: Sitting, Cuff Size: Normal)   Pulse 70   Temp 98.7 F (37.1 C) (Oral)   Resp 17   Ht 5\' 7"  (1.702 m)   Wt 160 lb (72.6 kg)   LMP 07/13/2016   SpO2 99%   BMI 25.06 kg/m   Physical Exam  Constitutional: She is oriented to person, place, and time and well-developed, well-nourished, and in no distress.  HENT:  Head: Normocephalic and atraumatic.  Right Ear: Hearing and external ear  normal.  Left Ear: Hearing and external ear normal.  Eyes: Conjunctivae are normal.  Neck: Normal range of motion.  Cardiovascular: Normal rate, regular rhythm and normal heart sounds.   No murmur heard. Pulmonary/Chest: Effort normal and breath sounds normal. She has no wheezes.  Abdominal: There is tenderness (upper abdominal area with the epigastric and slightly to the left side the most tender).  Neurological: She is alert and oriented to person, place, and time. Gait normal.  Skin: Skin is warm and dry.  Psychiatric: Mood, memory, affect and judgment normal.  Vitals reviewed.   Assessment and Plan :  Epigastric pain - Plan: CBC with Differential/Platelet, COMPLETE METABOLIC PANEL WITH GFR, Lipase  Heartburn  Acute gastritis without hemorrhage, unspecified gastritis type - Plan: sucralfate (CARAFATE) 1 GM/10ML suspension, ranitidine (ZANTAC) 150 MG tablet   Pt has acute gastritis likely related to increase motrin use for herself and spicy food and their timing.  We will treat her symptoms and she will contact me if things do not improve.  She will monitor her stool for change in color though currently they are darker due to pepto use which she is going to stop today and in 3-4 if she is still having dark stools she will RTC for test of blood in stool.  Her questions were answered and she agrees with the  plan.  Jaidon Ellery PA-C  [facility] 07/20/2016 10:08 AM

## 2016-09-21 ENCOUNTER — Ambulatory Visit (INDEPENDENT_AMBULATORY_CARE_PROVIDER_SITE_OTHER): Payer: Managed Care, Other (non HMO) | Admitting: Emergency Medicine

## 2016-09-21 VITALS — BP 100/67 | HR 83 | Temp 98.0°F | Ht 67.0 in | Wt 161.4 lb

## 2016-09-21 DIAGNOSIS — J111 Influenza due to unidentified influenza virus with other respiratory manifestations: Secondary | ICD-10-CM | POA: Insufficient documentation

## 2016-09-21 DIAGNOSIS — R059 Cough, unspecified: Secondary | ICD-10-CM

## 2016-09-21 DIAGNOSIS — R05 Cough: Secondary | ICD-10-CM

## 2016-09-21 DIAGNOSIS — M791 Myalgia, unspecified site: Secondary | ICD-10-CM

## 2016-09-21 MED ORDER — HYDROCOD POLST-CPM POLST ER 10-8 MG/5ML PO SUER: 5.0000 mL | Freq: Two times a day (BID) | ORAL | 0 refills | Status: AC

## 2016-09-21 MED ORDER — OSELTAMIVIR PHOSPHATE 6 MG/ML PO SUSR: 75.0000 mg | Freq: Two times a day (BID) | ORAL | 1 refills | Status: AC

## 2016-09-21 NOTE — Patient Instructions (Addendum)
     IF you received an x-ray today, you will receive an invoice from [facility]. Please contact [facility] at [phone] with questions or concerns regarding your invoice.   IF you received labwork today, you will receive an invoice from [facility]. Please contact [facility] at [phone] with questions or concerns regarding your invoice.   Our billing staff will not be able to assist you with questions regarding bills from these companies.  You will be contacted with the lab results as soon as they are available. The fastest way to get your results is to activate your My Chart account. Instructions are located on the last page of this paperwork. If you have not heard from us regarding the results in 2 weeks, please contact this office.      Influenza, Adult Influenza ("the flu") is an infection in the lungs, nose, and throat (respiratory tract). It is caused by a virus. The flu causes many common cold symptoms, as well as a high fever and body aches. It can make you feel very sick. The flu spreads easily from person to person (is contagious). Getting a flu shot (influenza vaccination) every year is the best way to prevent the flu. Follow these instructions at home:  Take over-the-counter and prescription medicines only as told by your doctor.  Use a cool mist humidifier to add moisture (humidity) to the air in your home. This can make it easier to breathe.  Rest as needed.  Drink enough fluid to keep your pee (urine) clear or pale yellow.  Cover your mouth and nose when you cough or sneeze.  Wash your hands with soap and water often, especially after you cough or sneeze. If you cannot use soap and water, use hand sanitizer.  Stay home from work or school as told by your doctor. Unless you are visiting your doctor, try to avoid leaving home until your fever has been gone for 24 hours without the use of medicine.  Keep all follow-up visits as told by your doctor.  This is important. How is this prevented?  Getting a yearly (annual) flu shot is the best way to avoid getting the flu. You may get the flu shot in late summer, fall, or winter. Ask your doctor when you should get your flu shot.  Wash your hands often or use hand sanitizer often.  Avoid contact with people who are sick during cold and flu season.  Eat healthy foods.  Drink plenty of fluids.  Get enough sleep.  Exercise regularly. Contact a doctor if:  You get new symptoms.  You have:  Chest pain.  Watery poop (diarrhea).  A fever.  Your cough gets worse.  You start to have more mucus.  You feel sick to your stomach (nauseous).  You throw up (vomit). Get help right away if:  You start to be short of breath or have trouble breathing.  Your skin or nails turn a bluish color.  You have very bad pain or stiffness in your neck.  You get a sudden headache.  You get sudden pain in your face or ear.  You cannot stop throwing up. This information is not intended to replace advice given to you by your health care provider. Make sure you discuss any questions you have with your health care provider. Document Released: 06/06/2008 Document Revised: 02/03/2016 Document Reviewed: 06/22/2015 Elsevier Interactive Patient Education  2017 Elsevier Inc.  

## 2016-09-21 NOTE — Progress Notes (Signed)
Jasmine Bullock 36 y.o.   Chief Complaint  Patient presents with  . Cough    X 1 day with fever and chills    HISTORY OF PRESENT ILLNESS: This is a 36 y.o. female complaining of 1 day h/o fever and cough; symptoms started abruptly yesterday am with fever quickly followed by generalized muscle achiness. Kids at son's school recently diagnosed with influenza.  Influenza  This is a new problem. The current episode started yesterday. The problem occurs constantly. The problem has been rapidly worsening. Associated symptoms include anorexia, chills, coughing, fatigue, a fever, headaches, myalgias and weakness. Pertinent negatives include no abdominal pain, arthralgias, chest pain, diaphoresis, nausea, neck pain, rash, sore throat, swollen glands, urinary symptoms, vertigo or vomiting. Nothing aggravates the symptoms. She has tried acetaminophen for the symptoms. The treatment provided no relief.     Prior to Admission medications   Medication Sig Start Date End Date Taking? Authorizing Provider  letrozole (FEMARA) 2.5 MG tablet Take 2.5 mg by mouth daily.   Yes Historical Provider, MD  PRENATAL 28-0.8 MG TABS Take by mouth. Reported on 12/21/2015   Yes Historical Provider, MD  ranitidine (ZANTAC) 150 MG tablet Take 1 tablet (150 mg total) by mouth 2 (two) times daily. 07/18/16  Yes Morrell Riddle, PA-C  sucralfate (CARAFATE) 1 GM/10ML suspension Take 10 mLs (1 g total) by mouth 4 (four) times daily -  with meals and at bedtime. 07/18/16  Yes Morrell Riddle, PA-C  chlorpheniramine-HYDROcodone (TUSSIONEX PENNKINETIC ER) 10-8 MG/5ML SUER Take 5 mLs by mouth 2 (two) times daily. 09/21/16 09/24/16  Georgina Quint, MD  glucosamine-chondroitin 500-400 MG tablet Take 1 tablet by mouth 3 (three) times daily.    Historical Provider, MD  oseltamivir (TAMIFLU) 6 MG/ML SUSR suspension Take 12.5 mLs (75 mg total) by mouth 2 (two) times daily. 09/21/16 09/26/16  Georgina Quint, MD    No Known  Allergies  Patient Active Problem List   Diagnosis Date Noted  . Influenza with respiratory manifestation 09/21/2016  . Viral gastritis 12/12/2015    Past Medical History:  Diagnosis Date  . Allergy   . Anxiety   . Asthma    childhood  . Depression     Past Surgical History:  Procedure Laterality Date  . L wrist surgery      Social History   Social History  . Marital status: Married    Spouse name: N/A  . Number of children: 1  . Years of education: N/A   Occupational History  . homemaker    Social History Main Topics  . Smoking status: Never Smoker  . Smokeless tobacco: Never Used  . Alcohol use 1.8 oz/week    3 Standard drinks or equivalent per week  . Drug use: No  . Sexual activity: Yes   Other Topics Concern  . 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 Hypertension Mother   .  Cancer Maternal Grandmother     Ovarian  . Alcohol abuse Maternal Grandfather   . High Cholesterol Paternal Grandmother   . Hypertension Paternal Grandmother   . Diabetes Paternal Grandfather   . Heart disease Paternal Grandfather      Review of Systems  Constitutional: Positive for chills, fatigue and fever. Negative for diaphoresis.  HENT: Negative for ear discharge, ear pain, nosebleeds, sinus pain and sore throat.   Eyes: Negative for discharge and redness.  Respiratory: Positive for cough. Negative for hemoptysis, shortness of breath and wheezing.   Cardiovascular: Negative for chest pain and palpitations.  Gastrointestinal: Positive for anorexia. Negative for abdominal pain,  diarrhea, nausea and vomiting.  Genitourinary: Negative for dysuria and urgency.  Musculoskeletal: Positive for myalgias. Negative for arthralgias and neck pain.  Skin: Negative for rash.  Neurological: Positive for weakness and headaches. Negative for dizziness and vertigo.  Endo/Heme/Allergies: Negative.   Psychiatric/Behavioral: Negative.   All other systems reviewed and are negative.   Vitals:   09/21/16 0909  BP: 100/67  Pulse: 83  Temp: 98 F (36.7 C)    Physical Exam  Constitutional: She is oriented to person, place, and time. She appears well-developed and well-nourished.  Ill-looking  HENT:  Head: Normocephalic and atraumatic.  Nose: Nose normal.  Mouth/Throat: Oropharynx is clear and moist.  Eyes: Conjunctivae and EOM are normal. Pupils are equal, round, and reactive to light.  Neck: Normal range of motion. Neck supple.  Cardiovascular: Normal rate, regular rhythm, normal heart sounds and intact distal pulses.   Pulmonary/Chest: Effort normal and breath sounds normal. No respiratory distress. She has no wheezes.  Abdominal: Soft. Bowel sounds are normal. She exhibits no distension. There is no tenderness. There is no guarding.  Musculoskeletal: Normal range of motion.  Lymphadenopathy:    She has no cervical adenopathy.  Neurological: She is alert and oriented to person, place, and time.  Skin: Skin is warm and dry. Capillary refill takes less than 2 seconds.  Psychiatric: She has a normal mood and affect. Her behavior is normal.  Vitals reviewed.    ASSESSMENT & PLAN: Wylene MenLacey was seen today for cough.  Diagnoses and all orders for this visit:  Influenza with respiratory manifestation  Cough  Generalized muscle ache  Other orders -     oseltamivir (TAMIFLU) 6 MG/ML SUSR suspension; Take 12.5 mLs (75 mg total) by mouth 2 (two) times daily. -     chlorpheniramine-HYDROcodone (TUSSIONEX PENNKINETIC ER) 10-8 MG/5ML SUER; Take 5 mLs by mouth 2 (two) times  daily.    Patient Instructions       IF you received an x-ray today, you will receive an invoice from [facility]. Please contact [facility] at [phone] with questions or concerns regarding your invoice.   IF you received labwork today, you will receive an invoice from [facility]. Please contact [facility] at [phone] with questions or concerns regarding your invoice.   Our billing staff will not be able to assist you with questions regarding bills from these companies.  You will be contacted with the lab results as soon as they are available. The fastest way to get your results is to activate your My Chart account. Instructions are located on the last page of this paperwork. If you have not heard from us regarding the results in 2 weeks, please contact this office.       Influenza, Adult Influenza ("the flu") is an infection in the lungs, nose, and throat (respiratory tract). It is caused by  a virus. The flu causes many common cold symptoms, as well as a high fever and body aches. It can make you feel very sick. The flu spreads easily from person to person (is contagious). Getting a flu shot (influenza vaccination) every year is the best way to prevent the flu. Follow these instructions at home:  Take over-the-counter and prescription medicines only as told by your doctor.  Use a cool mist humidifier to add moisture (humidity) to the air in your home. This can make it easier to breathe.  Rest as needed.  Drink enough fluid to keep your pee (urine) clear or pale yellow.  Cover your mouth and nose when you cough or sneeze.  Wash your hands with soap and water often, especially after you cough or sneeze. If you cannot use soap and water, use hand sanitizer.  Stay home from work or school as told by your doctor. Unless you are visiting your doctor, try to avoid leaving home until your fever has been gone for 24 hours without the use of medicine.  Keep all  follow-up visits as told by your doctor. This is important. How is this prevented?  Getting a yearly (annual) flu shot is the best way to avoid getting the flu. You may get the flu shot in late summer, fall, or winter. Ask your doctor when you should get your flu shot.  Wash your hands often or use hand sanitizer often.  Avoid contact with people who are sick during cold and flu season.  Eat healthy foods.  Drink plenty of fluids.  Get enough sleep.  Exercise regularly. Contact a doctor if:  You get new symptoms.  You have:  Chest pain.  Watery poop (diarrhea).  A fever.  Your cough gets worse.  You start to have more mucus.  You feel sick to your stomach (nauseous).  You throw up (vomit). Get help right away if:  You start to be short of breath or have trouble breathing.  Your skin or nails turn a bluish color.  You have very bad pain or stiffness in your neck.  You get a sudden headache.  You get sudden pain in your face or ear.  You cannot stop throwing up. This information is not intended to replace advice given to you by your health care provider. Make sure you discuss any questions you have with your health care provider. Document Released: 06/06/2008 Document Revised: 02/03/2016 Document Reviewed: 06/22/2015 Elsevier Interactive Patient Education  2017 Elsevier Inc.      Edwina Barth, MD [facility]

## 2016-10-27 ENCOUNTER — Ambulatory Visit (INDEPENDENT_AMBULATORY_CARE_PROVIDER_SITE_OTHER): Payer: Managed Care, Other (non HMO) | Admitting: Family Medicine

## 2016-10-27 DIAGNOSIS — Z111 Encounter for screening for respiratory tuberculosis: Secondary | ICD-10-CM | POA: Diagnosis not present

## 2016-10-27 NOTE — Patient Instructions (Signed)
     IF you received an x-ray today, you will receive an invoice from [facility]. Please contact [facility] at [phone] with questions or concerns regarding your invoice.   IF you received labwork today, you will receive an invoice from [facility]. Please contact [facility] at [phone] with questions or concerns regarding your invoice.   Our billing staff will not be able to assist you with questions regarding bills from these companies.  You will be contacted with the lab results as soon as they are available. The fastest way to get your results is to activate your My Chart account. Instructions are located on the last page of this paperwork. If you have not heard from us regarding the results in 2 weeks, please contact this office.     

## 2016-10-27 NOTE — Progress Notes (Signed)

## 2016-10-27 NOTE — Progress Notes (Signed)
PPD Test 

## 2016-10-30 ENCOUNTER — Ambulatory Visit (INDEPENDENT_AMBULATORY_CARE_PROVIDER_SITE_OTHER): Payer: Managed Care, Other (non HMO) | Admitting: Physician Assistant

## 2016-10-30 DIAGNOSIS — F419 Anxiety disorder, unspecified: Secondary | ICD-10-CM | POA: Diagnosis not present

## 2016-10-30 DIAGNOSIS — Z1159 Encounter for screening for other viral diseases: Secondary | ICD-10-CM

## 2016-10-30 LAB — TB SKIN TEST
Induration: 0 mm
TB SKIN TEST: NEGATIVE

## 2016-10-30 NOTE — Progress Notes (Signed)
   Patient ID: Jasmine Bullock S Arch, female    DOB: [birth year], 36 y.o.   MRN: [id number]  PCP: Nilda SimmerSMITH,KRISTI, MD  Chief Complaint  Patient presents with  . Labs Only    hep b titer  . PPD Reading    left foream    Subjective:   Presents for Hep B titer and PPD reading.  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   Review of Systems  Constitutional: Negative.   HENT: Negative for sore throat.   Eyes: Negative for visual disturbance.  Respiratory: Negative for cough, chest tightness, shortness of breath and wheezing.   Cardiovascular: Negative for chest pain and palpitations.  Gastrointestinal: Negative for abdominal pain, diarrhea, nausea and vomiting.  Genitourinary: Negative for dysuria, frequency, hematuria and urgency.  Musculoskeletal: Negative for arthralgias and myalgias.  Skin: Negative for rash.  Neurological: Negative for dizziness, weakness and headaches.  Psychiatric/Behavioral: Negative for decreased concentration. The patient is nervous/anxious (has resumed citalopram).        There are no active problems to display for this patient.   No Known Allergies  Prior to Admission medications   Medication Sig Start Date End Date Taking? Authorizing Provider  citalopram (CELEXA) 10 MG tablet Take 10 mg by mouth daily.   Yes Historical Provider, MD  letrozole (FEMARA) 2.5 MG tablet Take 2.5 mg by mouth daily.   Yes Historical Provider, MD  PRENATAL 28-0.8 MG TABS Take by mouth. Reported on 12/21/2015   Yes Historical Provider, MD  ranitidine (ZANTAC) 150 MG tablet Take 1 tablet (150 mg total) by mouth 2 (two) times daily. 07/18/16  Yes Morrell RiddleSarah L Weber, PA-C  sucralfate (CARAFATE) 1 GM/10ML suspension Take 10 mLs (1 g total) by mouth 4 (four) times daily -  with meals and at bedtime. 07/18/16  Yes Morrell RiddleSarah L Weber, PA-C  glucosamine-chondroitin 500-400 MG tablet Take 1 tablet by mouth 3 (three)  times daily.    Historical Provider, MD     Past Medical, Surgical Family and Social History reviewed and updated.        Objective:  Physical Exam  Constitutional: She is oriented to person, place, and time. She appears well-developed and well-nourished. She is active and cooperative. No distress.  There were no vitals taken for this visit.   Eyes: Conjunctivae are normal.  Pulmonary/Chest: Effort normal.  Neurological: She is alert and oriented to person, place, and time.  Psychiatric: She has a normal mood and affect. Her speech is normal and behavior is normal.    PPD placed 10/27/2016 read: 0 mm (NEGATIVE)    Assessment & Plan:  1. Need for hepatitis B screening test Await titer result. If negative, plan booster dose of vaccine and repeat titer 4 weeks later. - Hepatitis B surface antibody  2. Anxiety She is back on citalopram, 10 mg. OK to continue. Let us know if it's not as effective as she needs. WOuld consider increasing the dose to 20 mg.   Fernande Brashelle S. Ellah Otte, PA-C Physician Assistant-Certified Primary Care at [facility]

## 2016-10-31 ENCOUNTER — Telehealth: Payer: Self-pay

## 2016-10-31 LAB — HEPATITIS B SURFACE ANTIBODY, QUANTITATIVE

## 2016-11-01 ENCOUNTER — Telehealth: Payer: Self-pay

## 2016-11-01 NOTE — Telephone Encounter (Signed)
Pt aware.

## 2016-11-01 NOTE — Telephone Encounter (Signed)
Release results in My Chart.  Yes, she is immune to hep B.

## 2016-11-01 NOTE — Telephone Encounter (Signed)
See hep b , she is immune correct?

## 2016-11-01 NOTE — Telephone Encounter (Signed)
Pt is very anxious to get her lab results this is relating to her job and it has been 48 hours which is how long that she was told that it would take and she states that she has to have the results TODAY  [phone]

## 2017-05-10 ENCOUNTER — Telehealth: Payer: Self-pay | Admitting: Family Medicine

## 2017-05-10 NOTE — Telephone Encounter (Signed)
PATIENT WOULD LIKE TO ASK DR. Katrinka BlazingSMITH IF SHE CAN GET A REFILL ON HER CITALOPRAM (CELEXA) 10 MG. HER DIRECTIONS SAY TO TAKE 3 TABLETS BY MOUTH DAILY, BUT SHE HAS BEEN JUST TAKING 1 DAILY AND IT SEEMS TO BE WORKING. SHE HAS AN APPOINTMENT SCHEDULED FOR A COMPLETE PHYSICAL WITH DR. Katrinka BlazingSMITH ON 06/11/17. BEST PHONE [phone] (CELL) PHARMACY CHOICE IS [facility] ON [address]. MBC

## 2017-05-11 NOTE — Telephone Encounter (Signed)
Per chart, patient has not had this medication written for her since 2017. Please advise, thank you

## 2017-05-12 MED ORDER — CITALOPRAM HYDROBROMIDE 10 MG PO TABS: 10.0000 mg | ORAL_TABLET | Freq: Every day | ORAL | 0 refills | Status: DC

## 2017-05-12 NOTE — Telephone Encounter (Signed)
Refill approved.  Please advise.

## 2017-06-11 ENCOUNTER — Encounter: Payer: Self-pay | Admitting: Family Medicine

## 2017-06-11 ENCOUNTER — Ambulatory Visit (INDEPENDENT_AMBULATORY_CARE_PROVIDER_SITE_OTHER): Payer: Managed Care, Other (non HMO) | Admitting: Family Medicine

## 2017-06-11 VITALS — BP 102/58 | HR 77 | Temp 98.0°F | Resp 16 | Ht 67.72 in | Wt 163.0 lb

## 2017-06-11 DIAGNOSIS — Z131 Encounter for screening for diabetes mellitus: Secondary | ICD-10-CM | POA: Diagnosis not present

## 2017-06-11 DIAGNOSIS — Z13220 Encounter for screening for lipoid disorders: Secondary | ICD-10-CM | POA: Diagnosis not present

## 2017-06-11 DIAGNOSIS — J029 Acute pharyngitis, unspecified: Secondary | ICD-10-CM | POA: Diagnosis not present

## 2017-06-11 DIAGNOSIS — Z0000 Encounter for general adult medical examination without abnormal findings: Secondary | ICD-10-CM | POA: Diagnosis not present

## 2017-06-11 DIAGNOSIS — F419 Anxiety disorder, unspecified: Secondary | ICD-10-CM | POA: Diagnosis not present

## 2017-06-11 DIAGNOSIS — N970 Female infertility associated with anovulation: Secondary | ICD-10-CM

## 2017-06-11 MED ORDER — CITALOPRAM HYDROBROMIDE 10 MG PO TABS: 10.0000 mg | ORAL_TABLET | Freq: Every day | ORAL | 3 refills | Status: DC

## 2017-06-11 NOTE — Progress Notes (Signed)
Subjective:    Patient ID: Jasmine Bullock, female    DOB: [birth year], 36 y.o.   MRN: [id number]  06/11/2017  Annual Exam   HPI This 36 y.o. female presents for Complete Physical Examination.  Last physical:  06-06-2016 Pap smear:  04-2015; repeat in 3-5 years.  Dr. Ernestina Penna; trying to conceive for two years.  Eye exam:  2-3 years ago; first exam since childhood Dental exam:  Dr. Patsey Berthold office in [place].   BP Readings from Last 3 Encounters:  06/11/17 (!) 102/58  09/21/16 100/67  07/18/16 114/74   Wt Readings from Last 3 Encounters:  06/11/17 163 lb (73.9 kg)  09/21/16 161 lb 6.4 oz (73.2 kg)  07/18/16 160 lb (72.6 kg)   Immunization History  Administered Date(s) Administered  . Influenza,inj,Quad PF,6+ Mos 06/12/2014, 05/16/2016  . PPD Test 10/27/2016  . Tdap 05/26/2009, 09/11/2012   Sore throat, fever: Tmax   Infertility: took Femara for seven months.  Trigger shot; never did insemenation.  Not consdering adoption.   Review of Systems  Constitutional: Negative for activity change, appetite change, chills, diaphoresis, fatigue, fever and unexpected weight change.  HENT: Positive for congestion and sore throat. Negative for dental problem, drooling, ear discharge, ear pain, facial swelling, hearing loss, mouth sores, nosebleeds, postnasal drip, rhinorrhea, sinus pressure, sneezing, tinnitus, trouble swallowing and voice change.   Eyes: Negative for photophobia, pain, discharge, redness, itching and visual disturbance.  Respiratory: Positive for cough. Negative for apnea, choking, chest tightness, shortness of breath, wheezing and stridor.   Cardiovascular: Negative for chest pain, palpitations and leg swelling.  Gastrointestinal: Negative for abdominal distention, abdominal pain, anal bleeding, blood in stool, constipation, diarrhea, nausea, rectal pain and vomiting.  Endocrine: Negative for cold intolerance, heat intolerance, polydipsia, polyphagia and polyuria.    Genitourinary: Negative for decreased urine volume, difficulty urinating, dyspareunia, dysuria, enuresis, flank pain, frequency, genital sores, hematuria, menstrual problem, pelvic pain, urgency, vaginal bleeding, vaginal discharge and vaginal pain.       Nocturia x 1.  Stress incontinence with sneezing/coughing.  Musculoskeletal: Negative for arthralgias, back pain, gait problem, joint swelling, myalgias, neck pain and neck stiffness.  Skin: Negative for color change, pallor, rash and wound.  Allergic/Immunologic: Negative for environmental allergies, food allergies and immunocompromised state.  Neurological: Negative for dizziness, tremors, seizures, syncope, facial asymmetry, speech difficulty, weakness, light-headedness, numbness and headaches.  Hematological: Negative for adenopathy. Does not bruise/bleed easily.  Psychiatric/Behavioral: Negative for agitation, behavioral problems, confusion, decreased concentration, dysphoric mood, hallucinations, self-injury, sleep disturbance and suicidal ideas. The patient is nervous/anxious. The patient is not hyperactive.        Bedtime 1100; wakes up 0700.    Past Medical History:  Diagnosis Date  . Allergy   . Anxiety   . Asthma    childhood  . Depression    Past Surgical History:  Procedure Laterality Date  . L wrist surgery     No Known Allergies Current Outpatient Prescriptions  Medication Sig Dispense Refill  . citalopram (CELEXA) 10 MG tablet Take 1 tablet (10 mg total) by mouth daily. 96 tablet 3   No current facility-administered medications for this visit.    Social History   Social History  . Marital status: Married    Spouse name: N/A  . Number of children: 1  . Years of education: N/A   Occupational History  . homemaker    Social History Main Topics  . Smoking status: Never Smoker  . Smokeless tobacco: Never Used  .  Alcohol use 1.8 oz/week    3 Standard drinks or equivalent per week  . Drug use: No  . Sexual  activity: Yes   Other Topics Concern  . 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      Tobacco: none      Alcohol: weekends; one glass of wine per night.      Exercise:  3-4 days per week; running 3-4 miles      Seatbelt: 100%; no texting   Family History  Problem Relation Age of Onset  . Hypertension Mother   . Cancer Maternal Grandmother        Ovarian  . Alcohol abuse Maternal Grandfather   . High Cholesterol Paternal Grandmother   . Hypertension Paternal Grandmother   . Diabetes Paternal Grandfather   . Heart disease Paternal Grandfather        Objective:    BP (!) 102/58   Pulse 77   Temp 98 F (36.7 C) (Oral)   Resp 16   Ht 5' 7.72" (1.72 m)   Wt 163 lb (73.9 kg)   LMP 05/26/2017   SpO2 98%   BMI 24.99 kg/m  Physical Exam  Constitutional: She is oriented to person, place, and time. She appears well-developed and well-nourished. No distress.  HENT:  Head: Normocephalic and atraumatic.  Right Ear: External ear normal.  Left Ear: External ear normal.  Nose: Nose normal.  Mouth/Throat: Posterior oropharyngeal erythema present. No oropharyngeal exudate.  Eyes: Pupils are equal, round, and reactive to light. Conjunctivae and EOM are normal.  Neck: Normal range of motion and full passive range of motion without pain. Neck supple. No JVD present. Carotid bruit is not present. No thyromegaly present.  Cardiovascular: Normal rate, regular rhythm and normal heart sounds.  Exam reveals no gallop and no friction rub.   No murmur heard. Pulmonary/Chest: Effort normal and breath sounds normal. She has no wheezes. She has no rales.  Abdominal: Soft. Bowel sounds are normal. She exhibits no distension and no mass. There is no tenderness. There is no rebound and no  guarding.  Musculoskeletal:       Right shoulder: Normal.       Left shoulder: Normal.       Cervical back: Normal.  Lymphadenopathy:    She has no cervical adenopathy.  Neurological: She is alert and oriented to person, place, and time. She has normal reflexes. No cranial nerve deficit. She exhibits normal muscle tone. Coordination normal.  Skin: Skin is warm and dry. No rash noted. She is not diaphoretic. No erythema. No pallor.  Psychiatric: She has a normal mood and affect. Her behavior is normal. Judgment and thought content normal.  Nursing note and vitals reviewed.   No results found. Depression screen [facility] 2/9 06/11/2017 10/30/2016 09/21/2016 07/18/2016 06/06/2016  Decreased Interest 0 0 0 0 0  Down, Depressed, Hopeless 0 0 0 0 0  PHQ - 2 Score 0 0 0 0 0   Fall Risk  06/11/2017 10/30/2016 10/27/2016 09/21/2016 07/18/2016  Falls in the past year? No No No No No        Assessment & Plan:   1. Routine physical examination   2. Anxiety  3. Sore throat   4. Screening for diabetes mellitus   5. Screening, lipid   6. Infertility associated with anovulation    -anticipatory guidance provided --- exercise, weight loss, safe driving practices, prenatal vitamin once daily while attempting to conceive. -obtain age appropriate screening labs and labs for chronic disease management. -recommend follow-up with gynecology/infertility specialist. -stable on Citalopram  daily for anxiety/depression. -acute onset sore throat; brother in law with negative rapid strep.  Consistent with viral syndrome; supportive care with rest,fluids,Tylenol, Mucinex DM; RTC inability to swallow. -gynecology care per gyn    Orders Placed This Encounter  Procedures  . CBC with Differential/Platelet  . Comprehensive metabolic panel    Order Specific Question:   Has the patient fasted?    Answer:   No  . Hemoglobin A1c  . TSH  . Lipid panel    Order Specific Question:   Has the patient fasted?     Answer:   No  . POCT urinalysis dipstick   Meds ordered this encounter  Medications  . citalopram (CELEXA) 10 MG tablet    Sig: Take 1 tablet (10 mg total) by mouth daily.    Dispense:  96 tablet    Refill:  3    No Follow-up on file.   Alexxis Mackert Paulita Fujita, M.D. Primary Care at [facility] previously [facility] [address] [place], [place]  [address] [phone] phone [phone] fax

## 2017-06-11 NOTE — Patient Instructions (Addendum)
   IF you received an x-ray today, you will receive an invoice from [facility]. Please contact [facility] at [phone] with questions or concerns regarding your invoice.   IF you received labwork today, you will receive an invoice from [facility]. Please contact [facility] at [phone] with questions or concerns regarding your invoice.   Our billing staff will not be able to assist you with questions regarding bills from these companies.  You will be contacted with the lab results as soon as they are available. The fastest way to get your results is to activate your My Chart account. Instructions are located on the last page of this paperwork. If you have not heard from us regarding the results in 2 weeks, please contact this office.      Preventive Care 18-39 Years, Female Preventive care refers to lifestyle choices and visits with your health care provider that can promote health and wellness. What does preventive care include?  A yearly physical exam. This is also called an annual well check.  Dental exams once or twice a year.  Routine eye exams. Ask your health care provider how often you should have your eyes checked.  Personal lifestyle choices, including:  Daily care of your teeth and gums.  Regular physical activity.  Eating a healthy diet.  Avoiding tobacco and drug use.  Limiting alcohol use.  Practicing safe sex.  Taking vitamin and mineral supplements as recommended by your health care provider. What happens during an annual well check? The services and screenings done by your health care provider during your annual well check will depend on your age, overall health, lifestyle risk factors, and family history of disease. Counseling  Your health care provider may ask you questions about your:  Alcohol use.  Tobacco use.  Drug use.  Emotional well-being.  Home and relationship well-being.  Sexual activity.  Eating  habits.  Work and work environment.  Method of birth control.  Menstrual cycle.  Pregnancy history. Screening  You may have the following tests or measurements:  Height, weight, and BMI.  Diabetes screening. This is done by checking your blood sugar (glucose) after you have not eaten for a while (fasting).  Blood pressure.  Lipid and cholesterol levels. These may be checked every 5 years starting at age 20.  Skin check.  Hepatitis C blood test.  Hepatitis B blood test.  Sexually transmitted disease (STD) testing.  BRCA-related cancer screening. This may be done if you have a family history of breast, ovarian, tubal, or peritoneal cancers.  Pelvic exam and Pap test. This may be done every 3 years starting at age 21. Starting at age 30, this may be done every 5 years if you have a Pap test in combination with an HPV test. Discuss your test results, treatment options, and if necessary, the need for more tests with your health care provider. Vaccines  Your health care provider may recommend certain vaccines, such as:  Influenza vaccine. This is recommended every year.  Tetanus, diphtheria, and acellular pertussis (Tdap, Td) vaccine. You may need a Td booster every 10 years.  Varicella vaccine. You may need this if you have not been vaccinated.  HPV vaccine. If you are 26 or younger, you may need three doses over 6 months.  Measles, mumps, and rubella (MMR) vaccine. You may need at least one dose of MMR. You may also need a second dose.  Pneumococcal 13-valent conjugate (PCV13) vaccine. You may need this if you have certain   conditions and were not previously vaccinated.  Pneumococcal polysaccharide (PPSV23) vaccine. You may need one or two doses if you smoke cigarettes or if you have certain conditions.  Meningococcal vaccine. One dose is recommended if you are age 19-21 years and a first-year college student living in a residence hall, or if you have one of several  medical conditions. You may also need additional booster doses.  Hepatitis A vaccine. You may need this if you have certain conditions or if you travel or work in places where you may be exposed to hepatitis A.  Hepatitis B vaccine. You may need this if you have certain conditions or if you travel or work in places where you may be exposed to hepatitis B.  Haemophilus influenzae type b (Hib) vaccine. You may need this if you have certain risk factors. Talk to your health care provider about which screenings and vaccines you need and how often you need them. This information is not intended to replace advice given to you by your health care provider. Make sure you discuss any questions you have with your health care provider. Document Released: 10/24/2001 Document Revised: 05/17/2016 Document Reviewed: 06/29/2015 Elsevier Interactive Patient Education  2017 Elsevier Inc.  

## 2017-06-11 NOTE — Progress Notes (Signed)
   Subjective:    Patient ID: Jasmine Bullock, female    DOB: [birth year], 36 y.o.   MRN: [id number]  HPI    Review of Systems  Constitutional: Positive for chills, diaphoresis, fatigue and fever.  HENT: Positive for sore throat.        Objective:   Physical Exam        Assessment & Plan:

## 2017-06-12 LAB — CBC WITH DIFFERENTIAL/PLATELET
BASOS ABS: 0 10*3/uL (ref 0.0–0.2)
BASOS: 0 %
EOS (ABSOLUTE): 0 10*3/uL (ref 0.0–0.4)
Eos: 0 %
Hematocrit: 42.4 % (ref 34.0–46.6)
Hemoglobin: 14.7 g/dL (ref 11.1–15.9)
IMMATURE GRANS (ABS): 0 10*3/uL (ref 0.0–0.1)
IMMATURE GRANULOCYTES: 0 %
LYMPHS: 20 %
Lymphocytes Absolute: 0.9 10*3/uL (ref 0.7–3.1)
MCH: 32.3 pg (ref 26.6–33.0)
MCHC: 34.7 g/dL (ref 31.5–35.7)
MCV: 93 fL (ref 79–97)
MONOS ABS: 0.7 10*3/uL (ref 0.1–0.9)
Monocytes: 15 %
Neutrophils Absolute: 3 10*3/uL (ref 1.4–7.0)
Neutrophils: 65 %
PLATELETS: 237 10*3/uL (ref 150–379)
RBC: 4.55 x10E6/uL (ref 3.77–5.28)
RDW: 12.2 % — AB (ref 12.3–15.4)
WBC: 4.6 10*3/uL (ref 3.4–10.8)

## 2017-06-12 LAB — LIPID PANEL
CHOL/HDL RATIO: 2.2 ratio (ref 0.0–4.4)
CHOLESTEROL TOTAL: 163 mg/dL (ref 100–199)
HDL: 73 mg/dL (ref >39)
LDL CALC: 79 mg/dL (ref 0–99)
Triglycerides: 54 mg/dL (ref 0–149)
VLDL CHOLESTEROL CAL: 11 mg/dL (ref 5–40)

## 2017-06-12 LAB — COMPREHENSIVE METABOLIC PANEL
A/G RATIO: 1.7 (ref 1.2–2.2)
ALT: 11 IU/L (ref 0–32)
AST: 18 IU/L (ref 0–40)
Albumin: 4.5 g/dL (ref 3.5–5.5)
Alkaline Phosphatase: 53 IU/L (ref 39–117)
BUN/Creatinine Ratio: 8 — ABNORMAL LOW (ref 9–23)
BUN: 7 mg/dL (ref 6–20)
Bilirubin Total: 0.4 mg/dL (ref 0.0–1.2)
CALCIUM: 8.9 mg/dL (ref 8.7–10.2)
CO2: 25 mmol/L (ref 20–29)
CREATININE: 0.89 mg/dL (ref 0.57–1.00)
Chloride: 100 mmol/L (ref 96–106)
GFR, EST AFRICAN AMERICAN: 96 mL/min/{1.73_m2} (ref >59)
GFR, EST NON AFRICAN AMERICAN: 84 mL/min/{1.73_m2} (ref >59)
GLUCOSE: 77 mg/dL (ref 65–99)
Globulin, Total: 2.6 g/dL (ref 1.5–4.5)
POTASSIUM: 4.1 mmol/L (ref 3.5–5.2)
Sodium: 138 mmol/L (ref 134–144)
TOTAL PROTEIN: 7.1 g/dL (ref 6.0–8.5)

## 2017-06-12 LAB — HEMOGLOBIN A1C
ESTIMATED AVERAGE GLUCOSE: 88 mg/dL
Hgb A1c MFr Bld: 4.7 % — ABNORMAL LOW (ref 4.8–5.6)

## 2017-06-12 LAB — TSH: TSH: 1.7 u[IU]/mL (ref 0.450–4.500)

## 2017-07-06 ENCOUNTER — Ambulatory Visit (INDEPENDENT_AMBULATORY_CARE_PROVIDER_SITE_OTHER): Payer: Managed Care, Other (non HMO) | Admitting: Family Medicine

## 2017-07-06 DIAGNOSIS — Z23 Encounter for immunization: Secondary | ICD-10-CM | POA: Diagnosis not present

## 2017-07-09 ENCOUNTER — Ambulatory Visit: Payer: Managed Care, Other (non HMO) | Admitting: Family Medicine

## 2017-10-25 ENCOUNTER — Telehealth: Payer: Self-pay | Admitting: Family Medicine

## 2017-10-25 ENCOUNTER — Other Ambulatory Visit: Payer: Self-pay

## 2017-10-25 DIAGNOSIS — Z20828 Contact with and (suspected) exposure to other viral communicable diseases: Secondary | ICD-10-CM

## 2017-10-25 MED ORDER — OSELTAMIVIR PHOSPHATE 75 MG PO CAPS: 75.0000 mg | ORAL_CAPSULE | Freq: Every day | ORAL | 0 refills | Status: DC

## 2017-10-25 MED ORDER — OSELTAMIVIR PHOSPHATE 30 MG PO CAPS: 75.0000 mg | ORAL_CAPSULE | Freq: Every day | ORAL | Status: DC

## 2017-10-25 NOTE — Telephone Encounter (Signed)
Copied from CRM [phone]. Topic: Quick Communication - See Telephone Encounter >> Oct 25, 2017 10:28 AM Joana ReamerWarren, Amber N wrote: CRM for notification. See Telephone encounter for: Patient called requesting a dose of Tamiflu since her son and niece have had it, and she doesn't want to get it. [facility] [address] [facility] [address]. Please advise.  10/25/17.

## 2017-10-25 NOTE — Telephone Encounter (Signed)
  Patient requesting Tamiflu; her son and niece have tested positive. [facility] [address] [facility] [address]. Please advise.

## 2017-10-25 NOTE — Telephone Encounter (Signed)
Discussed with Urban GibsonMani - He approved order for Tamiflu for prevention #5, Sig: 1 tablet per day x 5 days.  Spoke with pt and advised rx has been sent in.

## 2017-12-27 ENCOUNTER — Other Ambulatory Visit: Payer: Self-pay | Admitting: Obstetrics

## 2017-12-27 ENCOUNTER — Inpatient Hospital Stay (HOSPITAL_COMMUNITY)
Admission: AD | Admit: 2017-12-27 | Discharge: 2017-12-27 | Disposition: A | Discharge status: Home / Self Care | Payer: No Typology Code available for payment source | Source: Ambulatory Visit | Attending: Obstetrics and Gynecology | Admitting: Obstetrics and Gynecology

## 2017-12-27 ENCOUNTER — Encounter (HOSPITAL_COMMUNITY): Payer: Self-pay | Admitting: *Deleted

## 2017-12-27 DIAGNOSIS — O00101 Right tubal pregnancy without intrauterine pregnancy: Secondary | ICD-10-CM | POA: Diagnosis present

## 2017-12-27 LAB — AST: AST: 19 U/L (ref 15–41)

## 2017-12-27 LAB — CBC WITH DIFFERENTIAL/PLATELET
BASOS ABS: 0 10*3/uL (ref 0.0–0.1)
Basophils Relative: 0 %
Eosinophils Absolute: 0.1 10*3/uL (ref 0.0–0.7)
Eosinophils Relative: 1 %
HEMATOCRIT: 40.1 % (ref 36.0–46.0)
Hemoglobin: 14 g/dL (ref 12.0–15.0)
LYMPHS ABS: 3.1 10*3/uL (ref 0.7–4.0)
LYMPHS PCT: 29 %
MCH: 32.7 pg (ref 26.0–34.0)
MCHC: 34.9 g/dL (ref 30.0–36.0)
MCV: 93.7 fL (ref 78.0–100.0)
MONO ABS: 0.4 10*3/uL (ref 0.1–1.0)
Monocytes Relative: 4 %
NEUTROS ABS: 6.9 10*3/uL (ref 1.7–7.7)
Neutrophils Relative %: 66 %
Platelets: 272 10*3/uL (ref 150–400)
RBC: 4.28 MIL/uL (ref 3.87–5.11)
RDW: 12.7 % (ref 11.5–15.5)
WBC: 10.5 10*3/uL (ref 4.0–10.5)

## 2017-12-27 LAB — TYPE AND SCREEN
ABO/RH(D): O POS
ANTIBODY SCREEN: NEGATIVE

## 2017-12-27 LAB — CREATININE, SERUM
Creatinine, Ser: 0.72 mg/dL (ref 0.44–1.00)
GFR calc non Af Amer: >60 mL/min (ref >60)

## 2017-12-27 LAB — ABO/RH: ABO/RH(D): O POS

## 2017-12-27 LAB — HCG, QUANTITATIVE, PREGNANCY: HCG, BETA CHAIN, QUANT, S: 305 m[IU]/mL — AB (ref <5)

## 2017-12-27 LAB — BUN: BUN: 10 mg/dL (ref 6–20)

## 2017-12-27 MED ORDER — METHOTREXATE INJECTION FOR WOMEN'S HOSPITAL
50.0000 mg/m2 | Freq: Once | INTRAMUSCULAR | Status: AC
  Administered 2017-12-27: 95 mg via INTRAMUSCULAR
  Filled 2017-12-27: qty 1.9

## 2017-12-27 MED ORDER — RHO D IMMUNE GLOBULIN 1500 UNIT/2ML IJ SOSY
300.0000 ug | PREFILLED_SYRINGE | Freq: Once | INTRAMUSCULAR | Status: DC
  Filled 2017-12-27: qty 2

## 2017-12-27 NOTE — MAU Note (Signed)
PT SAYS SHE HAS  SHARP PAIN ON RIGHT SIDE- STARTED AT 0100-   STOPPED   THEN TODAY - DULL PAIN ALL DAY.   SO SHE WENT  TO DR -   Ernestina PennaFOGLEMAN . HAD U/S IN OFFICE - [facility] - CONFIRMED ECTOPIC .  SO PT  HERE FOR METHOTREXATE.

## 2017-12-27 NOTE — MAU Note (Signed)
HAD SPOTTING  ON UNDERWEAR  - 30 MIN AGO

## 2017-12-29 ENCOUNTER — Encounter (HOSPITAL_COMMUNITY): Payer: Self-pay

## 2017-12-29 ENCOUNTER — Inpatient Hospital Stay (HOSPITAL_COMMUNITY): Payer: No Typology Code available for payment source

## 2017-12-29 ENCOUNTER — Inpatient Hospital Stay (HOSPITAL_COMMUNITY)
Admission: AD | Admit: 2017-12-29 | Discharge: 2017-12-30 | Disposition: A | Discharge status: Home / Self Care | Payer: No Typology Code available for payment source | Source: Ambulatory Visit | Attending: Obstetrics and Gynecology | Admitting: Obstetrics and Gynecology

## 2017-12-29 ENCOUNTER — Inpatient Hospital Stay (HOSPITAL_COMMUNITY)
Admission: AD | Admit: 2017-12-29 | Discharge: 2017-12-29 | Disposition: A | Discharge status: Home / Self Care | Payer: No Typology Code available for payment source | Source: Ambulatory Visit | Attending: Obstetrics and Gynecology | Admitting: Obstetrics and Gynecology

## 2017-12-29 DIAGNOSIS — F419 Anxiety disorder, unspecified: Secondary | ICD-10-CM | POA: Insufficient documentation

## 2017-12-29 DIAGNOSIS — N839 Noninflammatory disorder of ovary, fallopian tube and broad ligament, unspecified: Secondary | ICD-10-CM | POA: Insufficient documentation

## 2017-12-29 DIAGNOSIS — O009 Unspecified ectopic pregnancy without intrauterine pregnancy: Secondary | ICD-10-CM | POA: Diagnosis present

## 2017-12-29 DIAGNOSIS — R1909 Other intra-abdominal and pelvic swelling, mass and lump: Secondary | ICD-10-CM | POA: Insufficient documentation

## 2017-12-29 DIAGNOSIS — R102 Pelvic and perineal pain: Secondary | ICD-10-CM | POA: Insufficient documentation

## 2017-12-29 DIAGNOSIS — F329 Major depressive disorder, single episode, unspecified: Secondary | ICD-10-CM

## 2017-12-29 DIAGNOSIS — R109 Unspecified abdominal pain: Secondary | ICD-10-CM | POA: Insufficient documentation

## 2017-12-29 DIAGNOSIS — R509 Fever, unspecified: Secondary | ICD-10-CM | POA: Insufficient documentation

## 2017-12-29 DIAGNOSIS — O00101 Right tubal pregnancy without intrauterine pregnancy: Secondary | ICD-10-CM

## 2017-12-29 DIAGNOSIS — Z79899 Other long term (current) drug therapy: Secondary | ICD-10-CM | POA: Insufficient documentation

## 2017-12-29 DIAGNOSIS — J45909 Unspecified asthma, uncomplicated: Secondary | ICD-10-CM | POA: Insufficient documentation

## 2017-12-29 NOTE — MAU Provider Note (Signed)
History     Chief Complaint  Patient presents with  . Abdominal Pain   37 yo G2P1011 MWF with known right ectopic s/p MTX yesterday presents for evaluation of pelvic ( low abdomen)pain not specific to one side. No assoc n/v. Pt has not taken any medication for the pain. Denies lightheadedness or dizziness  OB History    Gravida  2   Para  1   Term  1   Preterm      AB      Living  1     SAB      TAB      Ectopic      Multiple      Live Births  1           Past Medical History:  Diagnosis Date  . Allergy   . Anxiety   . Asthma    childhood  . Depression     Past Surgical History:  Procedure Laterality Date  . L wrist surgery    . WISDOM TOOTH EXTRACTION      Family History  Problem Relation Age of Onset  . Hypertension Mother   . Cancer Maternal Grandmother        Ovarian  . Alcohol abuse Maternal Grandfather   . High Cholesterol Paternal Grandmother   . Hypertension Paternal Grandmother   . Diabetes Paternal Grandfather   . Heart disease Paternal Grandfather     Social History   Tobacco Use  . Smoking status: Never Smoker  . Smokeless tobacco: Never Used  Substance Use Topics  . Alcohol use: Yes    Alcohol/week: 1.8 oz    Types: 3 Standard drinks or equivalent per week  . Drug use: No    Allergies: No Known Allergies  Medications Prior to Admission  Medication Sig Dispense Refill Last Dose  . citalopram (CELEXA) 10 MG tablet Take 1 tablet (10 mg total) by mouth daily. 96 tablet 3 Past Month at Unknown time  . oseltamivir (TAMIFLU) 75 MG capsule Take 1 capsule (75 mg total) by mouth daily. 10 capsule 0      Physical Exam   Blood pressure (!) 101/53, pulse 76, temperature 98.3 F (36.8 C), temperature source Oral, resp. rate 18, height 5' 6.75" (1.695 m), weight 74.8 kg (165 lb), last menstrual period 12/12/2017, SpO2 98 %.  General appearance: alert, cooperative and no distress Lungs: clear to auscultation bilaterally Heart:  regular rate and rhythm, S1, S2 normal, no murmur, click, rub or gallop Abdomen: soft, non-tender; bowel sounds normal; no masses,  no organomegaly  Pelvic deferred Extremity: no edema ED Course  IMP: Pelvic Pain Right ectopic pregnancy s/p MTX P) pelvic sonogram MDM Addendum: US Ob Transvaginal  Result Date: 12/29/2017 CLINICAL DATA:  Initial evaluation for no right ectopic pregnancy, previously scanned in office. Abdominal cramping. EXAM: OBSTETRIC <14 WK [place] AND TRANSVAGINAL OB US TECHNIQUE: Both transabdominal and transvaginal ultrasound examinations were performed for complete evaluation of the gestation as well as the maternal uterus, adnexal regions, and pelvic cul-de-sac. Transvaginal technique was performed to assess early pregnancy. COMPARISON:  None. FINDINGS: Intrauterine gestational sac: Absent. Yolk sac:  Negative. Embryo:  Negative. Cardiac Activity: N/A Heart Rate: N/A  bpm Subchorionic hemorrhage:  None visualized. Maternal uterus/adnexae: Right ovary within normal limits measuring 3.2 x 1.7 x 1.5 cm. Heterogeneous echogenic ectopic mass seen just medial to the right ovary, measuring 1.3 x 1.1 x 1.4 cm. Surrounding vascularity (image 25). Associated trace free fluid within the  pelvis. Left ovary within normal limits measuring 4.9 x 3.6 x 2.4 cm. Physiologic cyst/dominant follicle noted. IMPRESSION: 1.3 x 1.1 x 1.4 cm ectopic mass positioned just medial to the right ovary within the right adnexa with associated trace free pelvic fluid. Per history, this is a known finding, previously identified in office. Electronically Signed   By: Rise MuBenjamin  McClintock M.D.   On: 12/29/2017 06:39  no evidence of rupture. Pt and husband advised that right ectopic is smaller than what we saw in the office. No acute abdomen. Will continue with medical mgmt of ectopic. Due for repeat hquant tomorrow and f/u in office Ectopic precautions P) d/c home. May use extra strength tylenol for pain prn. Call back  if sx worsens Serita KyleSheronette A Diksha Tagliaferro, MD 6:48 AM 12/29/2017

## 2017-12-29 NOTE — MAU Note (Signed)
Pt has a known ectopic pregnancy. Received MTX on Thursday. Pt states she started having lower abdominal cramping that started around 1-2a. Pt states she has not taken anything because she did not want to mask the pain. States she had some vaginal bleeding yesterday, but none today.

## 2017-12-29 NOTE — Discharge Instructions (Signed)
Methotrexate Treatment for an Ectopic Pregnancy, Care After °Refer to this sheet in the next few weeks. These instructions provide you with information on caring for yourself after your procedure. Your health care provider may also give you more specific instructions. Your treatment has been planned according to current medical practices, but problems sometimes occur. Call your health care provider if you have any problems or questions after your procedure. °What can I expect after the procedure? °You may have some abdominal cramping, vaginal bleeding, and fatigue in the first few days after taking methotrexate. Some other possible side effects of methotrexate include: °· Nausea. °· Vomiting. °· Diarrhea. °· Mouth sores. °· Swelling or irritation of the lining of your lungs (pneumonitis). °· Liver damage. °· Hair loss. ° °Follow these instructions at home: °After you have received the methotrexate medicine, you need to be careful of your activities and watch your condition for several weeks. It may take 1 week before your hormone levels return to normal. °Activity °· Do not have sexual intercourse until your health care provider says it is safe to do so. °· You may resume your usual diet. °· Limit strenuous activity. °· Do not drink alcohol. °General instructions °· Do not take aspirin, ibuprofen, or naproxen (nonsteroidal anti-inflammatory drugs [NSAIDs]). °· Do not take folic acid, prenatal vitamins, or other vitamins that contain folic acid. °· Avoid traveling too far away from your health care provider. °· Keep all follow-up visits as told by your health care provider. This is important. °Contact a health care provider if: °· You cannot control your nausea and vomiting. °· You cannot control your diarrhea. °· You have sores in your mouth and want treatment. °· You need pain medicine for your abdominal pain. °· You have a rash. °· You are having a reaction to the medicine. °Get help right away if: °· You have  increasing abdominal or pelvic pain. °· You notice increased bleeding. °· You feel light-headed, or you faint. °· You have shortness of breath. °· Your heart rate increases. °· You have a cough. °· You have chills. °· You have a fever. °This information is not intended to replace advice given to you by your health care provider. Make sure you discuss any questions you have with your health care provider. °Document Released: 08/17/2011 Document Revised: 02/03/2016 Document Reviewed: 06/16/2013 °Elsevier Interactive Patient Education © 2017 Elsevier Inc. ° °

## 2017-12-29 NOTE — Progress Notes (Signed)
Dr Cherly Hensenousins called in and given u/s results. Will come see pt and plans to d/c pt home

## 2017-12-30 ENCOUNTER — Other Ambulatory Visit: Payer: Self-pay

## 2017-12-30 ENCOUNTER — Encounter (HOSPITAL_COMMUNITY): Payer: Self-pay | Admitting: *Deleted

## 2017-12-30 ENCOUNTER — Inpatient Hospital Stay (HOSPITAL_COMMUNITY): Payer: No Typology Code available for payment source

## 2017-12-30 LAB — URINALYSIS, ROUTINE W REFLEX MICROSCOPIC
Bilirubin Urine: NEGATIVE
Glucose, UA: NEGATIVE mg/dL
HGB URINE DIPSTICK: NEGATIVE
Ketones, ur: NEGATIVE mg/dL
Nitrite: NEGATIVE
PROTEIN: NEGATIVE mg/dL
RBC / HPF: NONE SEEN RBC/hpf (ref 0–5)
Specific Gravity, Urine: 1.021 (ref 1.005–1.030)
pH: 8 (ref 5.0–8.0)

## 2017-12-30 LAB — CBC WITH DIFFERENTIAL/PLATELET
BASOS ABS: 0 10*3/uL (ref 0.0–0.1)
Basophils Relative: 0 %
Eosinophils Absolute: 0 10*3/uL (ref 0.0–0.7)
Eosinophils Relative: 0 %
HEMATOCRIT: 39.1 % (ref 36.0–46.0)
Hemoglobin: 13.8 g/dL (ref 12.0–15.0)
Lymphocytes Relative: 14 %
Lymphs Abs: 1.6 10*3/uL (ref 0.7–4.0)
MCH: 32.8 pg (ref 26.0–34.0)
MCHC: 35.3 g/dL (ref 30.0–36.0)
MCV: 92.9 fL (ref 78.0–100.0)
MONO ABS: 0.4 10*3/uL (ref 0.1–1.0)
Monocytes Relative: 4 %
NEUTROS ABS: 8.9 10*3/uL — AB (ref 1.7–7.7)
Neutrophils Relative %: 82 %
PLATELETS: 212 10*3/uL (ref 150–400)
RBC: 4.21 MIL/uL (ref 3.87–5.11)
RDW: 12.5 % (ref 11.5–15.5)
WBC: 10.9 10*3/uL — ABNORMAL HIGH (ref 4.0–10.5)

## 2017-12-30 LAB — HCG, QUANTITATIVE, PREGNANCY: hCG, Beta Chain, Quant, S: 86 m[IU]/mL — ABNORMAL HIGH (ref <5)

## 2017-12-30 MED ORDER — CEPHALEXIN 500 MG PO CAPS
500.0000 mg | ORAL_CAPSULE | Freq: Once | ORAL | Status: AC
  Administered 2017-12-30: 500 mg via ORAL
  Filled 2017-12-30: qty 1

## 2017-12-30 MED ORDER — CEPHALEXIN 500 MG PO CAPS: 500.0000 mg | ORAL_CAPSULE | Freq: Four times a day (QID) | ORAL | 0 refills | Status: DC

## 2017-12-30 NOTE — Discharge Instructions (Signed)
Ectopic Pregnancy °An ectopic pregnancy happens when a fertilized egg grows outside the uterus. A pregnancy cannot live outside of the uterus. This problem often happens in the fallopian tube. It is often caused by damage to the fallopian tube. °If this problem is found early, you may be treated with medicine. If your tube tears or bursts open (ruptures), you will bleed inside. This is an emergency. You will need surgery. Get help right away. °What are the signs or symptoms? °You may have normal pregnancy symptoms at first. These include: °· Missing your period. °· Feeling sick to your stomach (nauseous). °· Being tired. °· Having tender breasts. ° °Then, you may start to have symptoms that are not normal. These include: °· Pain with sex (intercourse). °· Bleeding from the vagina. This includes light bleeding (spotting). °· Belly (abdomen) or lower belly cramping or pain. This may be felt on one side. °· A fast heartbeat (pulse). °· Passing out (fainting) after going poop (bowel movement). ° °If your tube tears, you may have symptoms such as: °· Really bad pain in the belly or lower belly. This happens suddenly. °· Dizziness. °· Passing out. °· Shoulder pain. ° °Get help right away if: °You have any of these symptoms. This is an emergency. °This information is not intended to replace advice given to you by your health care provider. Make sure you discuss any questions you have with your health care provider. °Document Released: 11/24/2008 Document Revised: 02/03/2016 Document Reviewed: 04/09/2013 °Elsevier Interactive Patient Education © 2017 Elsevier Inc. ° °

## 2017-12-30 NOTE — MAU Provider Note (Signed)
History     Chief Complaint  Patient presents with  . Abdominal Pain  . Fever  37 yo G2P1001 MWF with known right ectopic pregnancy s/p MTX 3 days ago presents today with c/o temp to 101.8 Per pt, she took tylenol an hour prior to arrival. Denies cough. Pt notes abdominal pain with urination and with movement. Pt told nurse that her child was sick. Abdominal pain was no worse than when she was here yesterday   OB History    Gravida  2   Para  1   Term  1   Preterm      AB      Living  1     SAB      TAB      Ectopic      Multiple      Live Births  1           Past Medical History:  Diagnosis Date  . Allergy   . Anxiety   . Asthma    childhood  . Depression     Past Surgical History:  Procedure Laterality Date  . L wrist surgery    . WISDOM TOOTH EXTRACTION      Family History  Problem Relation Age of Onset  . Hypertension Mother   . Cancer Maternal Grandmother        Ovarian  . Alcohol abuse Maternal Grandfather   . High Cholesterol Paternal Grandmother   . Hypertension Paternal Grandmother   . Diabetes Paternal Grandfather   . Heart disease Paternal Grandfather     Social History   Tobacco Use  . Smoking status: Never Smoker  . Smokeless tobacco: Never Used  Substance Use Topics  . Alcohol use: Yes    Alcohol/week: 1.8 oz    Types: 3 Standard drinks or equivalent per week  . Drug use: No    Allergies: No Known Allergies  Medications Prior to Admission  Medication Sig Dispense Refill Last Dose  . acetaminophen (TYLENOL) 500 MG tablet Take 1,000 mg by mouth every 6 (six) hours as needed.   t at 2245  . citalopram (CELEXA) 10 MG tablet Take 1 tablet (10 mg total) by mouth daily. 96 tablet 3 Past Week at Unknown time     Physical Exam   Blood pressure (!) 101/55, pulse 81, temperature 99.3 F (37.4 C), resp. rate 18, weight 74.4 kg (164 lb), last menstrual period 12/12/2017, SpO2 99 %.  General appearance: alert, cooperative  and no distress Lungs: clear to auscultation bilaterally Abdomen: soft nondistended (+) active BS throughout tender suprapubic area only Pelvic: deferred  A/P: abdominal/suprapubic pain Right ectopic pregnancy P) u/a, ucx. Pelvic sonogram. Cbc w/ diff. hquant   US Ob Transvaginal  Result Date: 12/30/2017 CLINICAL DATA:  Follow-up ectopic EXAM: TRANSVAGINAL OB ULTRASOUND TECHNIQUE: Transvaginal ultrasound was performed for complete evaluation of the gestation as well as the maternal uterus, adnexal regions, and pelvic cul-de-sac. COMPARISON:  12/29/2017 FINDINGS: Intrauterine gestational sac: Not seen Yolk sac:  Not seen Embryo:  Not seen Cardiac Activity: Not seen Maternal uterus/adnexae: Left ovary contains probable hemorrhagic corpus luteal cyst. The left ovary measures 2.9 x 4.1 x 3.3 cm. Right ovary measures 3.3 x 2.6 x 2 cm. Adjacent to the right ovary is a slightly echogenic adnexal mass measuring 1.7 x 1.3 cm with peripheral flow. Trace free fluid. IMPRESSION: No intrauterine pregnancy. Similar size and appearance of a right adnexal slightly echogenic mass presumably corresponding to given history of ectopic  pregnancy. No large volume of hemorrhagic fluid in the pelvis or adnexa. Electronically Signed   By: Jasmine PangKim  Fujinaga M.D.   On: 12/30/2017 01:34   [place] Ob Transvaginal  Result Date: 12/29/2017 CLINICAL DATA:  Initial evaluation for no right ectopic pregnancy, previously scanned in office. Abdominal cramping. EXAM: OBSTETRIC <14 WK US AND TRANSVAGINAL OB US TECHNIQUE: Both transabdominal and transvaginal ultrasound examinations were performed for complete evaluation of the gestation as well as the maternal uterus, adnexal regions, and pelvic cul-de-sac. Transvaginal technique was performed to assess early pregnancy. COMPARISON:  None. FINDINGS: Intrauterine gestational sac: Absent. Yolk sac:  Negative. Embryo:  Negative. Cardiac Activity: N/A Heart Rate: N/A  bpm Subchorionic hemorrhage:  None  visualized. Maternal uterus/adnexae: Right ovary within normal limits measuring 3.2 x 1.7 x 1.5 cm. Heterogeneous echogenic ectopic mass seen just medial to the right ovary, measuring 1.3 x 1.1 x 1.4 cm. Surrounding vascularity (image 25). Associated trace free fluid within the pelvis. Left ovary within normal limits measuring 4.9 x 3.6 x 2.4 cm. Physiologic cyst/dominant follicle noted. IMPRESSION: 1.3 x 1.1 x 1.4 cm ectopic mass positioned just medial to the right ovary within the right adnexa with associated trace free pelvic fluid. Per history, this is a known finding, previously identified in office. Electronically Signed   By: Rise MuBenjamin  McClintock M.D.   On: 12/29/2017 06:39   ED Course   addendum CBC Latest Ref Rng & Units 12/30/2017 12/27/2017 06/11/2017  WBC 4.0 - 10.5 K/uL 10.9(H) 10.5 4.6  Hemoglobin 12.0 - 15.0 g/dL 69.613.8 29.514.0 28.414.7  Hematocrit 36.0 - 46.0 % 39.1 40.1 42.4  Platelets 150 - 400 K/uL 212 272 237    Disc CBC result as it compared to prior CBC. Sl increase in wbc. Disc waiting for ucx vs starting antibiotic. Pt and husband opts for antibiotic. Advised ucx will take 48 hrs. Keflex 500mg  po q 6 hrs pending ucx. One dose of keflex now. Also informed hgb stable along with sonogram does not show evidence of rupture  Advised to keep planned hquant testing Wednesday.  Discuss if pain is worse then surgical intervention is the next step  And an option today. Per pt she just wanted to know what is the cause of her fever. Advised not aware MTX would cause a fever. May be urinary since that is the area of her pain Call for ucx result. To call back if fever returns or pain worsens D/c home. Cont ectopic precautions MDM   Serita KyleSheronette A Iley Deignan, MD 2:57 AM 12/30/2017

## 2017-12-30 NOTE — MAU Note (Signed)
Pt reports abdominal pain worse w/ movement, laughing, sneezing and also urinating. Pt feels nauseated, but no vomiting. Fever had a low grade fever around 4pm and then was 101.8 this evening. Pt took tylenol about one hour ago.

## 2017-12-31 LAB — URINE CULTURE
Culture: <10000 — AB
SPECIAL REQUESTS: NORMAL

## 2018-01-25 ENCOUNTER — Encounter: Payer: Self-pay | Admitting: Family Medicine

## 2018-01-30 ENCOUNTER — Encounter: Payer: Self-pay | Admitting: Family Medicine

## 2018-03-08 ENCOUNTER — Ambulatory Visit: Payer: 59 | Admitting: Physician Assistant

## 2018-03-08 ENCOUNTER — Encounter: Payer: Self-pay | Admitting: Physician Assistant

## 2018-03-08 VITALS — BP 99/63 | HR 74 | Temp 98.4°F | Resp 17 | Ht 68.5 in | Wt 165.0 lb

## 2018-03-08 DIAGNOSIS — F410 Panic disorder [episodic paroxysmal anxiety] without agoraphobia: Secondary | ICD-10-CM

## 2018-03-08 DIAGNOSIS — M722 Plantar fascial fibromatosis: Secondary | ICD-10-CM | POA: Diagnosis not present

## 2018-03-08 DIAGNOSIS — M79672 Pain in left foot: Secondary | ICD-10-CM | POA: Diagnosis not present

## 2018-03-08 MED ORDER — ALPRAZOLAM 0.25 MG PO TABS: 0.2500 mg | ORAL_TABLET | Freq: Two times a day (BID) | ORAL | 0 refills | Status: DC | PRN

## 2018-03-08 MED ORDER — MELOXICAM 7.5 MG PO TABS: 7.5000 mg | ORAL_TABLET | Freq: Every day | ORAL | 1 refills | Status: DC

## 2018-03-08 NOTE — Patient Instructions (Addendum)
Plantar fasciitis  What causes heel pain? - One of the most common causes of heel pain is a problem called "plantar fasciitis." Plantar fasciitis is the term doctors use when a part of the foot called the plantar fascia gets irritated or swollen. The plantar fascia is a tough band of tissue that connects the heel bone to the toes  Initial therapy - Treatment of obesity, symptomatic flat feet, and systemic inflammation should be undertaken when these conditions are present. Otherwise, treatment should begin with conservative therapy including measures to relieve pain, alterations in shoes or habits, and exercise therapy.  General approach to therapy: ?Performing of stretching exercises for the plantar fascia and calf muscles, which the patient can do at home. ?Avoiding the use of flat shoes and barefoot walking. ?Using prefabricated, over-the-counter, silicone heel shoe inserts (arch supports and/or heel cups). ?Decreasing physical activities that are suggested by the medical history to be causative or aggravating (eg, excessive running, dancing, or jumping). ?Prescribing or recommending a short-term trial (two to three weeks) of nonsteroidal antiinflammatory drugs (NSAIDs). Use of NSAIDs is reasonable, but their long-term use should be reserved for patients with known systemic rheumatic disease. ?Injecting the tender areas of the plantar region with glucocorticoids and a local anesthetic. In patients without sufficient improvement from initial measures, more costly therapies can be considered, although these remain unprove: ?Molded shoe inserts (orthotics) ?Night splints - Some people feel better if they wear a splint while they sleep that keeps their foot straight. These splints are sold in drugstores and medical supply stores. ?Rest and icing may give initial pain relief. ?Wearing slippers or going barefoot may aggravate symptoms or may result in a recurrence of symptoms. Thus, the first step out  of bed should be made wearing a supportive shoe or sandal. ?Patients who work or reside in buildings with concrete floors should use cushion- or crepe-soled shoes. Excessive heel impact from jumping or during walking should be avoided. ?Athletic shoes, arch-supporting shoes (particularly those with an extra-long counter, which is the firm part of the shoe that surrounds the heel), or shoes with rigid shanks (usually a metal insert into the sole of the shoe) may be helpful. Shoes with these features can be found in stores featuring work shoes or "orthopedic shoes." ?Exercises may be beneficial, although evidence is limited. Home exercises include plantar and calf-plantar fascia stretches, foot-ankle circles, toe curls, toe towel curls, and unilateral heel raises with toe dorsiflexion.   Is there anything I can do to keep from getting heel pain again? - Yes. To reduce the chances that your pain will come back: ?Wear shoes that fit well, have a lot of cushion, and support the heel and ankle ?Avoid wearing slippers, flip-flops, slip-ons, or poorly fitted shoes ?Avoid going barefoot ?Do not wear worn-out shoes  Come back and see me if you are still not improving in 6-8 weeks.   Thank you for coming in today. I hope you feel we met your needs.  Feel free to call PCP if you have any questions or further requests.  Please consider signing up for MyChart if you do not already have it, as this is a great way to communicate with me.  Best,  Whitney McVey, PA-C  IF you received an x-ray today, you will receive an invoice from [facility]. Please contact [facility] at [phone] with questions or concerns regarding your invoice.   IF you received labwork today, you will receive an invoice from [facility]. Please contact [facility]  at [phone] with questions or concerns regarding your invoice.   Our billing staff will not be able to assist you with questions regarding bills from these  companies.  You will be contacted with the lab results as soon as they are available. The fastest way to get your results is to activate your My Chart account. Instructions are located on the last page of this paperwork. If you have not heard from [place] regarding the results in 2 weeks, please contact this office.

## 2018-03-08 NOTE — Progress Notes (Signed)
Jasmine Bullock  MRN: [id number] DOB: 01/16/1981  PCP: Ethelda ChickSmith, Jasmine M, MD  Subjective:  Pt is a 37 year old female who presents to clinic for several complaints.   1) plantar fasciitis -patient ran a 10K October 2018 after that time developed pain of the bottom of her left foot. pain is worse first thing in the morning And with her first steps out of bed.  She wears supportive shoes and has tried Dr. Early OsmondShoal's inserts, and walk-fit inserts.  These measures are not helping.  Running makes it worse.  Recently she has developed pain on the outside of her left foot She takes antiinflammatories occasionally.  "it;s not as acute as it was 6 months ago". It's a nagging thing that won't get better.   2) anxiety, depression, panic attacks.  She had an ectopic pregnancy about 4 months ago.  She endorsed several weeks of depression after that.  Increased her Celexa to 20 mg daily. her mood has improved somewhat however she now endorses occasional "being on the verge of a panic attack".  Her gynecologist gave her contact information for therapists, however she has not yet contacted any of them to make an appointment.  She believes this will help She has had problems with this in the past, has taken Xanax.  This was several years ago.  Denies SI or HI  Review of Systems  Constitutional: Negative for chills, diaphoresis, fatigue and fever.  Musculoskeletal: Positive for arthralgias (left foot. ) and gait problem. Negative for joint swelling.  Skin: Negative.   Psychiatric/Behavioral: Positive for dysphoric mood. Negative for self-injury and suicidal ideas. The patient is nervous/anxious.     Patient Active Problem List   Diagnosis Date Noted  . Anxiety 10/30/2016    Current Outpatient Medications on File Prior to Visit  Medication Sig Dispense Refill  . citalopram (CELEXA) 10 MG tablet Take 1 tablet (10 mg total) by mouth daily. (Patient taking differently: Take 20 mg by mouth daily. ) 96 tablet 3     No current facility-administered medications on file prior to visit.     No Known Allergies   Objective:  BP 99/63   Pulse 74   Temp 98.4 F (36.9 C) (Oral)   Resp 17   Ht 5' 8.5" (1.74 Bullock)   Wt 165 lb (74.8 kg)   LMP 12/12/2017   SpO2 98%   BMI 24.72 kg/Bullock   Physical Exam  Constitutional: She is oriented to person, place, and time. No distress.  Musculoskeletal:       Left foot: There is tenderness. There is normal range of motion and no bony tenderness.       Feet:  Neurological: She is alert and oriented to person, place, and time.  Skin: Skin is warm and dry.  Psychiatric: Her behavior is normal. Judgment and thought content normal. Cognition and memory are normal.  Tearful  Vitals reviewed.   Assessment and Plan :  1. Plantar fasciitis 2. Left foot pain -Patient complains of pain from plantar fasciitis for the past 8 months, this is improving however is still not better.  Silicone heel cups given to patient, she "loves them".  Return to clinic in 6 to 8 weeks if no improvement.  Consider Ortho or sports medicine referral. - meloxicam (MOBIC) 7.5 MG tablet; Take 1-2 tablets (7.5-15 mg total) by mouth daily.  Dispense: 30 tablet; Refill: 1  3. Panic attack -Patient presents with recurring episodes of possible panic attacks.  Okay to  start Xanax 0.25 mg as needed.  Depression and anxiety believed to be secondary to recent infertility problems. Denies SI or HI.  Encouraged patient to start seeing a therapist. - ALPRAZolam (XANAX) 0.25 MG tablet; Take 1 tablet (0.25 mg total) by mouth 2 (two) times daily as needed for anxiety.  Dispense: 20 tablet; Refill: 0      Marco Collie, PA-C  [facility] 03/08/2018 11:56 AM

## 2018-03-29 DIAGNOSIS — Z3141 Encounter for fertility testing: Secondary | ICD-10-CM | POA: Diagnosis not present

## 2018-04-27 ENCOUNTER — Encounter: Payer: Self-pay | Admitting: Physician Assistant

## 2018-04-29 ENCOUNTER — Other Ambulatory Visit: Payer: Self-pay | Admitting: Physician Assistant

## 2018-04-29 DIAGNOSIS — M722 Plantar fascial fibromatosis: Secondary | ICD-10-CM

## 2018-05-23 DIAGNOSIS — M722 Plantar fascial fibromatosis: Secondary | ICD-10-CM | POA: Diagnosis not present

## 2018-05-23 DIAGNOSIS — M79672 Pain in left foot: Secondary | ICD-10-CM | POA: Diagnosis not present

## 2018-06-05 DIAGNOSIS — M722 Plantar fascial fibromatosis: Secondary | ICD-10-CM | POA: Diagnosis not present

## 2018-06-13 DIAGNOSIS — M722 Plantar fascial fibromatosis: Secondary | ICD-10-CM | POA: Diagnosis not present

## 2018-06-21 DIAGNOSIS — M722 Plantar fascial fibromatosis: Secondary | ICD-10-CM | POA: Diagnosis not present

## 2018-06-27 DIAGNOSIS — M722 Plantar fascial fibromatosis: Secondary | ICD-10-CM | POA: Diagnosis not present

## 2018-06-28 DIAGNOSIS — M722 Plantar fascial fibromatosis: Secondary | ICD-10-CM | POA: Diagnosis not present

## 2018-07-03 DIAGNOSIS — M722 Plantar fascial fibromatosis: Secondary | ICD-10-CM | POA: Diagnosis not present

## 2018-07-04 DIAGNOSIS — M654 Radial styloid tenosynovitis [de Quervain]: Secondary | ICD-10-CM | POA: Diagnosis not present

## 2018-07-04 DIAGNOSIS — M722 Plantar fascial fibromatosis: Secondary | ICD-10-CM | POA: Diagnosis not present

## 2018-07-05 ENCOUNTER — Telehealth: Payer: Self-pay | Admitting: Physician Assistant

## 2018-07-05 DIAGNOSIS — M722 Plantar fascial fibromatosis: Secondary | ICD-10-CM | POA: Diagnosis not present

## 2018-07-05 NOTE — Telephone Encounter (Signed)
Called pt to reschedule physical advised of bldg.late policy and fasting for labs. Pt may come in early for labs

## 2018-07-19 ENCOUNTER — Ambulatory Visit (INDEPENDENT_AMBULATORY_CARE_PROVIDER_SITE_OTHER): Payer: 59 | Admitting: Physician Assistant

## 2018-07-19 ENCOUNTER — Encounter: Payer: Self-pay | Admitting: Physician Assistant

## 2018-07-19 VITALS — BP 108/60 | HR 70 | Temp 97.9°F | Resp 16 | Ht 67.0 in | Wt 166.8 lb

## 2018-07-19 DIAGNOSIS — Z13228 Encounter for screening for other metabolic disorders: Secondary | ICD-10-CM

## 2018-07-19 DIAGNOSIS — Z1329 Encounter for screening for other suspected endocrine disorder: Secondary | ICD-10-CM

## 2018-07-19 DIAGNOSIS — Z130 Encounter for screening for diseases of the blood and blood-forming organs and certain disorders involving the immune mechanism: Secondary | ICD-10-CM | POA: Diagnosis not present

## 2018-07-19 DIAGNOSIS — Z0000 Encounter for general adult medical examination without abnormal findings: Secondary | ICD-10-CM

## 2018-07-19 LAB — POCT URINALYSIS DIP (MANUAL ENTRY)
Bilirubin, UA: NEGATIVE
Blood, UA: NEGATIVE
Glucose, UA: NEGATIVE mg/dL
Leukocytes, UA: NEGATIVE
Nitrite, UA: NEGATIVE
Protein Ur, POC: NEGATIVE mg/dL
Spec Grav, UA: 1.02 (ref 1.010–1.025)
Urobilinogen, UA: 0.2 U/dL
pH, UA: 6 (ref 5.0–8.0)

## 2018-07-19 NOTE — Patient Instructions (Addendum)
Come back at your convenience when you are feeling better for a "fast track" flu shot.    You will be contacted with your lab results within the next 2 weeks.  If you have not heard from [place] then please contact us. The fastest way to get your results is to register for My Chart.   Keeping You Healthy   Get These Tests  Blood Pressure- Have your blood pressure checked by your healthcare provider at least once a year.  Normal blood pressure is 120/80.  Weight- Have your body mass index (BMI) calculated to screen for obesity.  BMI is a measure of body fat based on height and weight.  You can calculate your own BMI at [employer].it  Cholesterol- Have your cholesterol checked every year.  Diabetes- Have your blood sugar checked every year if you have high blood pressure, high cholesterol, a family history of diabetes or if you are overweight.  Pap Test - Have a pap test every 1 to 5 years if you have been sexually active.  If you are older than 65 and recent pap tests have been normal you may not need additional pap tests.  In addition, if you have had a hysterectomy  for benign disease additional pap tests are not necessary.  Mammogram-Yearly mammograms are essential for early detection of breast cancer  Screening for Colon Cancer- Colonoscopy starting at age 58. Screening may begin sooner depending on your family history and other health conditions.  Follow up colonoscopy as directed by your Gastroenterologist.  Screening for Osteoporosis- Screening begins at age 33 with bone density scanning, sooner if you are at higher risk for developing Osteoporosis.   Get these medicines  Calcium with Vitamin D- Your body requires 1200-1500 mg of Calcium a day and [phone] IU of Vitamin D a day.  You can only absorb 500 mg of Calcium at a time therefore Calcium must be taken in 2 or 3 separate doses throughout the day.  Hormones- Hormone therapy has been associated with increased risk for  certain cancers and heart disease.  Talk to your healthcare provider about if you need relief from menopausal symptoms.  Aspirin- Ask your healthcare provider about taking Aspirin to prevent Heart Disease and Stroke.   Get these Immuniztions  Flu shot- Every fall  Pneumonia shot- Once after the age of 79; if you are younger ask your healthcare provider if you need a pneumonia shot.  Tetanus- Every ten years.  Zostavax- Once after the age of 50 to prevent shingles.   Take these steps  Don't smoke- Your healthcare provider can help you quit. For tips on how to quit, ask your healthcare provider or go to www.smokefree.gov or call 1-800 QUIT-NOW.  Be physically active- Exercise 5 days a week for a minimum of 30 minutes.  If you are not already physically active, start slow and gradually work up to 30 minutes of moderate physical activity.  Try walking, dancing, bike riding, swimming, etc.  Eat a healthy diet- Eat a variety of healthy foods such as fruits, vegetables, whole grains, low fat milk, low fat cheeses, yogurt, lean meats, chicken, fish, eggs, dried beans, tofu, etc.  For more information go to www.thenutritionsource.org  Dental visit- Brush and floss teeth twice daily; visit your dentist twice a year.  Eye exam- Visit your Optometrist or Ophthalmologist yearly.  Drink alcohol in moderation- Limit alcohol intake to one drink or less a day.  Never drink and drive.  Depression- Your emotional health is  as important as your physical health.  If you're feeling down or losing interest in things you normally enjoy, please talk to your healthcare provider.  Seat Belts- can save your life; always wear one  Smoke/Carbon Monoxide detectors- These detectors need to be installed on the appropriate level of your home.  Replace batteries at least once a year.  Violence- If anyone is threatening or hurting you, please tell your healthcare provider.  Living Will/ Health care power of  attorney- Discuss with your healthcare provider and family.  Thank you for coming in today. I hope you feel we met your needs.  Feel free to call PCP if you have any questions or further requests.  Please consider signing up for MyChart if you do not already have it, as this is a great way to communicate with me.  Best,  [employer], PA-C

## 2018-07-19 NOTE — Progress Notes (Signed)
[facility] [place], [place] [address] [phone]  Date:  07/19/2018   Name:  Jasmine Bullock   DOB:  October 08, 1980   MRN:  [id number]  PCP:  No primary care provider on file.    Chief Complaint: No chief complaint on file.   History of Present Illness:  This is a 37 y.o. female with PMH anxiety and plantar fasciitis who is presenting for CPE.  Last annual 06/2017 with Dr. Tamala Julian.  She is feeling well today.   H/o anxiety/depression controlled with Citalopram 24m qd and Xanax PRN. She takes xanax about < once/month.   Complaints: con't plantar fasciitis x >1 year. Seeing ortho and PT. Might get injection next month.  LMP 07/06/2018.  Contraception: trying to conceive. H/o ectopic pregnancy 12/2017.  Last pap: 04-2015, negative, with Dr. FPamala Hurry repeat in 3-5 years.  Sexual history: intercourse with husband only. 514year old son.  Immunizations: needs flu shot. She is not feeling well today, with chills yesterday.  Dentist: regular check-ups Eye: 20/15 b/l  Diet/Exercise: not exercising x 3 weeks due to plantar fasciitis. Typically, she runs 3-4 x/week  MM: last year. Negative.   Wt Readings from Last 3 Encounters:  03/08/18 165 lb (74.8 kg)  12/29/17 164 lb (74.4 kg)  12/29/17 165 lb (74.8 kg)   Lab Results  Component Value Date   CHOL 163 06/11/2017   CHOL 151 06/06/2016   Lab Results  Component Value Date   HDL 73 06/11/2017   HDL 73 06/06/2016   Lab Results  Component Value Date   LDLCALC 79 06/11/2017   LDLCALC 70 06/06/2016   Lab Results  Component Value Date   TRIG 54 06/11/2017   TRIG 38 06/06/2016   Lab Results  Component Value Date   CHOLHDL 2.2 06/11/2017   CHOLHDL 2.1 06/06/2016   No results found for: LDLDIRECT  Review of Systems:  Review of Systems  Constitutional: Negative for chills, diaphoresis, fatigue and fever.  HENT: Negative for congestion, postnasal drip, rhinorrhea, sinus pressure, sneezing and sore  throat.   Respiratory: Negative for cough, chest tightness, shortness of breath and wheezing.   Cardiovascular: Negative for chest pain and palpitations.  Gastrointestinal: Negative for abdominal pain, diarrhea, nausea and vomiting.  Genitourinary: Negative for decreased urine volume, difficulty urinating, dysuria, enuresis, flank pain, frequency (Nocturia X <1/night), hematuria and urgency.  Musculoskeletal: Positive for arthralgias (left foot pain). Negative for back pain.  Neurological: Negative for dizziness, weakness, light-headedness and headaches.    Patient Active Problem List   Diagnosis Date Noted  . Plantar fasciitis 03/08/2018  . Anxiety 10/30/2016    Prior to Admission medications   Medication Sig Start Date End Date Taking? Authorizing Provider  ALPRAZolam (XANAX) 0.25 MG tablet Take 1 tablet (0.25 mg total) by mouth 2 (two) times daily as needed for anxiety. 03/08/18   Zaedyn Covin, EGelene Mink PA-C  citalopram (CELEXA) 10 MG tablet Take 1 tablet (10 mg total) by mouth daily. Patient taking differently: Take 20 mg by mouth daily.  06/11/17   SWardell Honour MD  meloxicam (MOBIC) 7.5 MG tablet Take 1-2 tablets (7.5-15 mg total) by mouth daily. 03/08/18   Garry Bochicchio, EGelene Mink PA-C    No Known Allergies  Past Surgical History:  Procedure Laterality Date  . L wrist surgery    . WISDOM TOOTH EXTRACTION      Social History   Tobacco Use  . Smoking status: Never Smoker  . Smokeless  tobacco: Never Used  Substance Use Topics  . Alcohol use: Yes    Alcohol/week: 3.0 standard drinks    Types: 3 Standard drinks or equivalent per week  . Drug use: No    Family History  Problem Relation Age of Onset  . Hypertension Mother   . Cancer Maternal Grandmother        Ovarian  . Alcohol abuse Maternal Grandfather   . High Cholesterol Paternal Grandmother   . Hypertension Paternal Grandmother   . Diabetes Paternal Grandfather   . Heart disease Paternal Grandfather      Medication list has been reviewed and updated.  Physical Examination:  Physical Exam  Constitutional: She is oriented to person, place, and time. She appears well-developed and well-nourished. No distress.  HENT:  Head: Normocephalic and atraumatic.  Mouth/Throat: Oropharynx is clear and moist.  Eyes: Pupils are equal, round, and reactive to light. Conjunctivae and EOM are normal.  Neck: Normal range of motion. Neck supple. No thyromegaly present.  Cardiovascular: Normal rate, regular rhythm and normal heart sounds.  No murmur heard. Pulmonary/Chest: Effort normal and breath sounds normal. She has no wheezes.  Abdominal: Soft. She exhibits no mass.  Musculoskeletal: Normal range of motion.  Neurological: She is alert and oriented to person, place, and time. She has normal reflexes.  Skin: Skin is warm and dry.  Psychiatric: She has a normal mood and affect. Her behavior is normal. Judgment and thought content normal.  Vitals reviewed.  Results for orders placed or performed in visit on 07/19/18  POCT urinalysis dipstick  Result Value Ref Range   Color, UA yellow yellow   Clarity, UA clear clear   Glucose, UA negative negative mg/dL   Bilirubin, UA negative negative   Ketones, POC UA small (15) (A) negative mg/dL   Spec Grav, UA 1.020 1.010 - 1.025   Blood, UA negative negative   pH, UA 6.0 5.0 - 8.0   Protein Ur, POC negative negative mg/dL   Urobilinogen, UA 0.2 0.2 or 1.0 E.U./dL   Nitrite, UA Negative Negative   Leukocytes, UA Negative Negative    Assessment and Plan: 1. Annual physical exam - Mrs. Tonner presents for annual exam. Routine labs are pending, will contact with results.  - PAP utd. No h/o abnormal PAP. Next PAP in 1-2 years. Dr. Valentino Saxon GYN.  - Anxiety well controlled Citalopram 64m qd and Xanax PRN. - Anticipatory guidance provided.   2. Screening for endocrine, metabolic and immunity disorder - CBC with Differential/Platelet - CMP14+EGFR -  POCT urinalysis dipstick - Lipid panel  WMercer Pod PA-C  [facility]/04/2018 12:41 PM

## 2018-07-20 LAB — CBC WITH DIFFERENTIAL/PLATELET
Basophils Absolute: 0 10*3/uL (ref 0.0–0.2)
Basos: 1 %
EOS (ABSOLUTE): 0.1 10*3/uL (ref 0.0–0.4)
Eos: 1 %
Hematocrit: 42.2 % (ref 34.0–46.6)
Hemoglobin: 14.7 g/dL (ref 11.1–15.9)
Immature Grans (Abs): 0 10*3/uL (ref 0.0–0.1)
Immature Granulocytes: 0 %
Lymphocytes Absolute: 1.9 10*3/uL (ref 0.7–3.1)
Lymphs: 38 %
MCH: 32.6 pg (ref 26.6–33.0)
MCHC: 34.8 g/dL (ref 31.5–35.7)
MCV: 94 fL (ref 79–97)
Monocytes Absolute: 0.7 10*3/uL (ref 0.1–0.9)
Monocytes: 14 %
Neutrophils Absolute: 2.2 10*3/uL (ref 1.4–7.0)
Neutrophils: 46 %
Platelets: 216 10*3/uL (ref 150–450)
RBC: 4.51 x10E6/uL (ref 3.77–5.28)
RDW: 11.3 % — ABNORMAL LOW (ref 12.3–15.4)
WBC: 4.9 10*3/uL (ref 3.4–10.8)

## 2018-07-20 LAB — CMP14+EGFR
ALT: 19 IU/L (ref 0–32)
AST: 17 IU/L (ref 0–40)
Albumin/Globulin Ratio: 1.7 (ref 1.2–2.2)
Albumin: 4.3 g/dL (ref 3.5–5.5)
Alkaline Phosphatase: 36 IU/L — ABNORMAL LOW (ref 39–117)
BUN/Creatinine Ratio: 15 (ref 9–23)
BUN: 10 mg/dL (ref 6–20)
Bilirubin Total: 0.4 mg/dL (ref 0.0–1.2)
CO2: 23 mmol/L (ref 20–29)
Calcium: 9 mg/dL (ref 8.7–10.2)
Chloride: 101 mmol/L (ref 96–106)
Creatinine, Ser: 0.65 mg/dL (ref 0.57–1.00)
GFR calc Af Amer: 131 mL/min/{1.73_m2} (ref >59)
GFR calc non Af Amer: 114 mL/min/{1.73_m2} (ref >59)
Globulin, Total: 2.6 g/dL (ref 1.5–4.5)
Glucose: 84 mg/dL (ref 65–99)
Potassium: 4.4 mmol/L (ref 3.5–5.2)
Sodium: 139 mmol/L (ref 134–144)
Total Protein: 6.9 g/dL (ref 6.0–8.5)

## 2018-07-20 LAB — LIPID PANEL
Chol/HDL Ratio: 2.6 ratio (ref 0.0–4.4)
Cholesterol, Total: 175 mg/dL (ref 100–199)
HDL: 67 mg/dL (ref >39)
LDL Calculated: 95 mg/dL (ref 0–99)
Triglycerides: 64 mg/dL (ref 0–149)
VLDL Cholesterol Cal: 13 mg/dL (ref 5–40)

## 2018-08-01 ENCOUNTER — Encounter: Payer: 59 | Admitting: Physician Assistant

## 2018-09-09 IMAGING — US US OB TRANSVAGINAL
1 series · 15 of 26 positions shown · non-contrast
Comparison: None.

CLINICAL DATA: Initial evaluation for no right ectopic pregnancy,
previously scanned in office. Abdominal cramping.

EXAM:
OBSTETRIC <14 WK US AND TRANSVAGINAL OB US
TECHNIQUE: Both transabdominal and transvaginal ultrasound examinations were
performed for complete evaluation of the gestation as well as the
maternal uterus, adnexal regions, and pelvic cul-de-sac.
Transvaginal technique was performed to assess early pregnancy.

[Series 1: us ob transvaginal · 26 acquisitions, 15 frames shown]
[im 1/26]
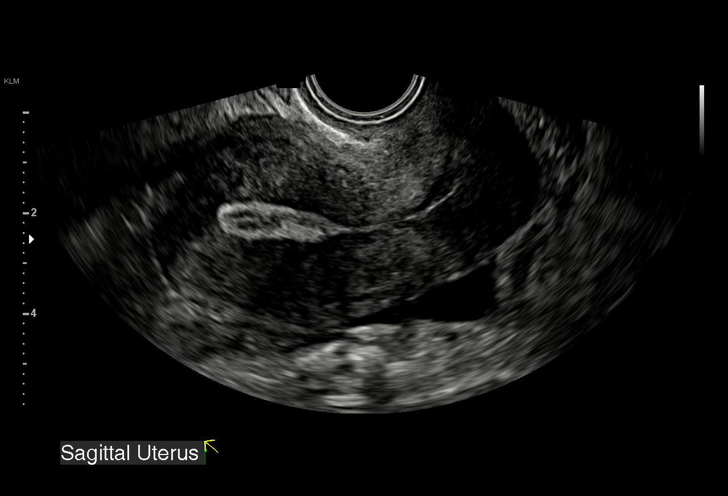
[im 3/26]
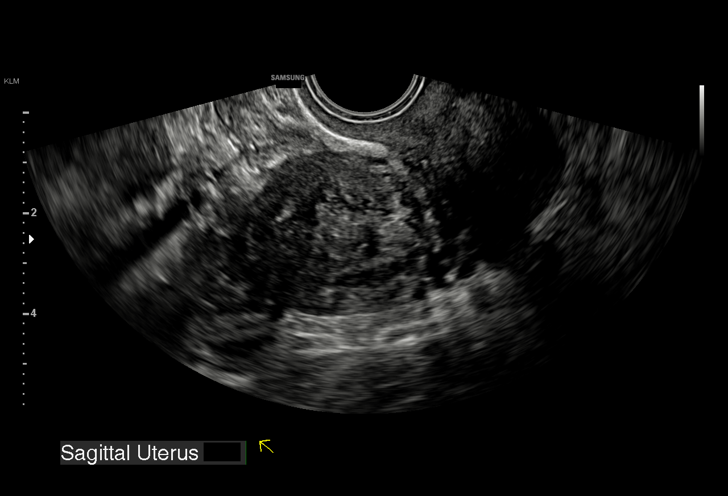
[im 5/26]
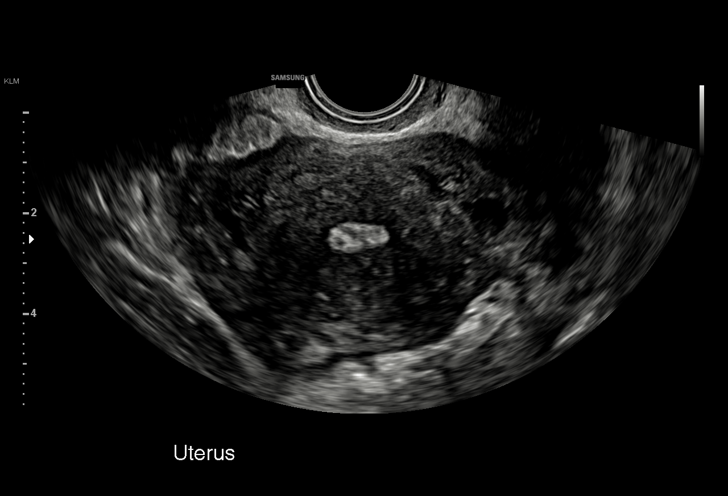
[im 7/26]
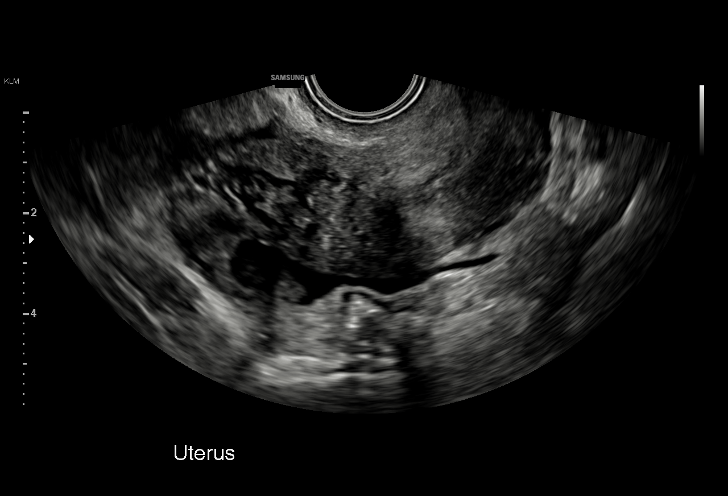
[im 8/26]
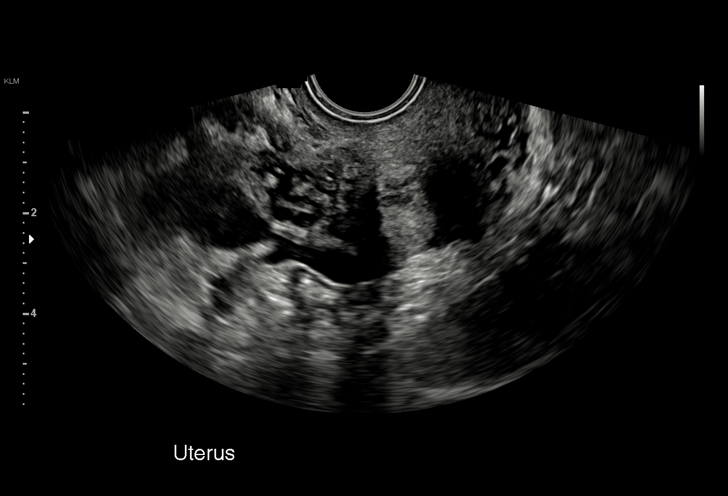
[im 10/26]
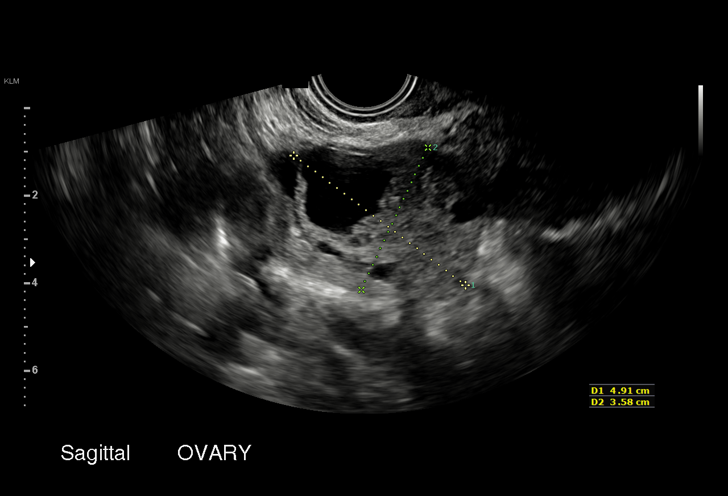
[im 12/26]
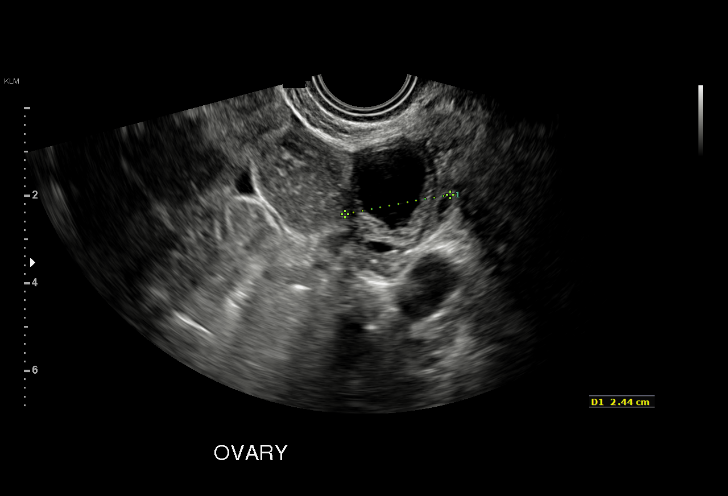
[im 14/26]
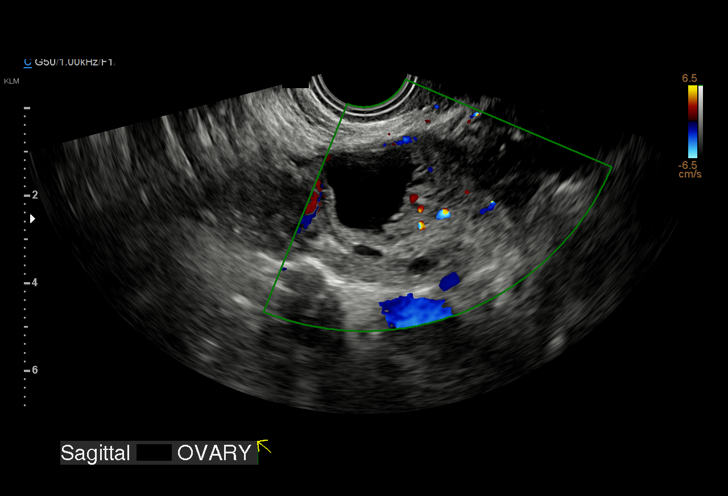
[im 15/26]
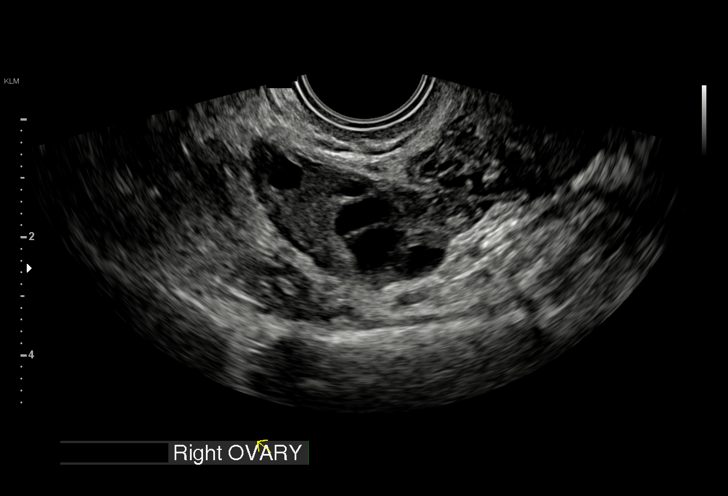
[im 17/26]
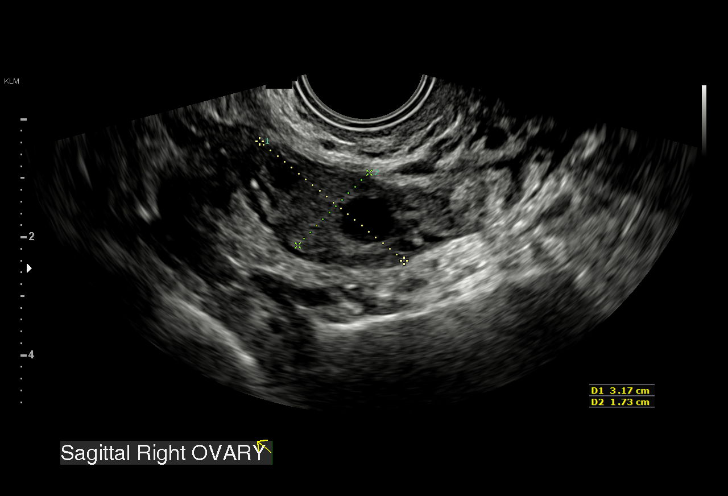
[im 19/26]
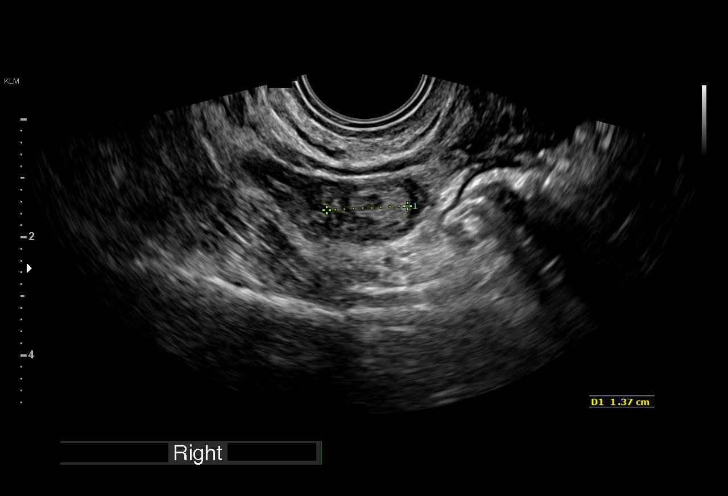
[im 20/26]
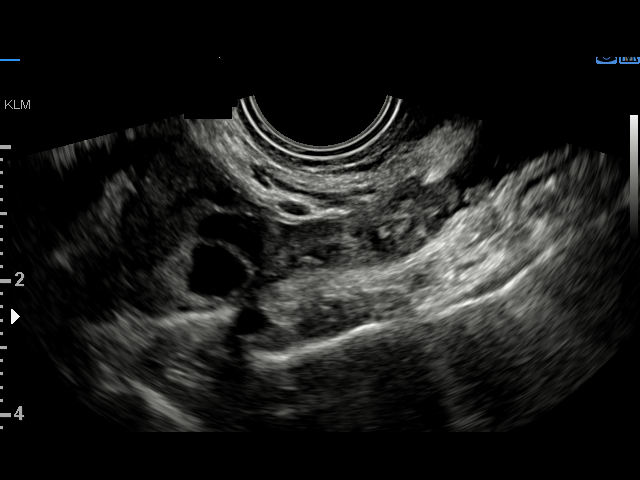
[im 22/26]
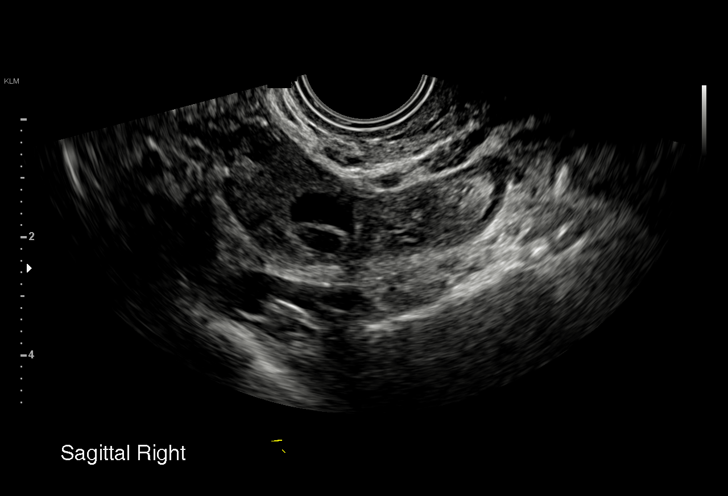
[im 24/26]
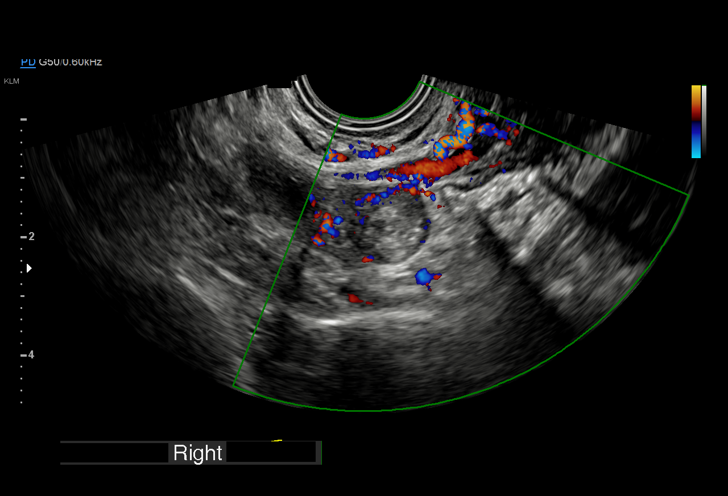
[im 26/26]
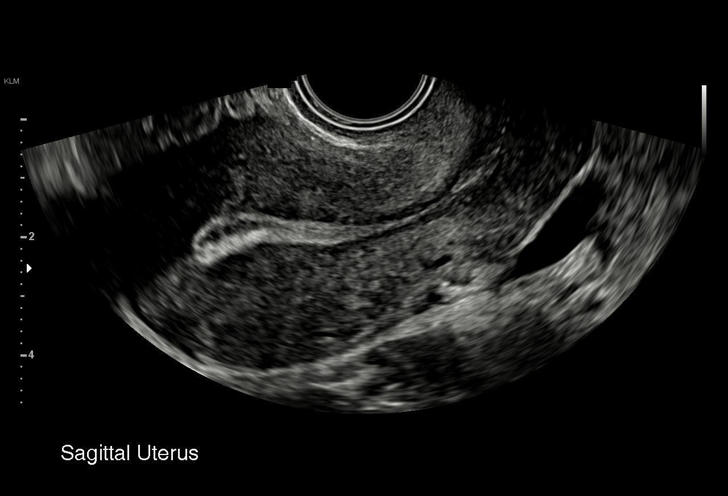

[15 of 26 positions shown; findings below may reference images not displayed]

FINDINGS: Intrauterine gestational sac: Absent.

Yolk sac:  Negative.

Embryo:  Negative.

Cardiac Activity: N/A

Heart Rate: N/A  bpm

Subchorionic hemorrhage:  None visualized.

Maternal uterus/adnexae: Right ovary within normal limits measuring
3.2 x 1.7 x 1.5 cm. Heterogeneous echogenic ectopic mass seen just
medial to the right ovary, measuring 1.3 x 1.1 x 1.4 cm. Surrounding
vascularity (image 25). Associated trace free fluid within the
pelvis.

Left ovary within normal limits measuring 4.9 x 3.6 x 2.4 cm.
Physiologic cyst/dominant follicle noted.
IMPRESSION: 1.3 x 1.1 x 1.4 cm ectopic mass positioned just medial to the right
ovary within the right adnexa with associated trace free pelvic
fluid. Per history, this is a known finding, previously identified
in office.

## 2018-09-10 IMAGING — US US OB TRANSVAGINAL
1 series · 15 of 28 positions shown · non-contrast
Comparison: 12/29/2017

CLINICAL DATA: Follow-up ectopic

EXAM:
TRANSVAGINAL OB ULTRASOUND
TECHNIQUE: Transvaginal ultrasound was performed for complete evaluation of the
gestation as well as the maternal uterus, adnexal regions, and
pelvic cul-de-sac.

[Series 1: us ob transvaginal · 36 acquisitions, 15 frames shown]
[im 1/36]
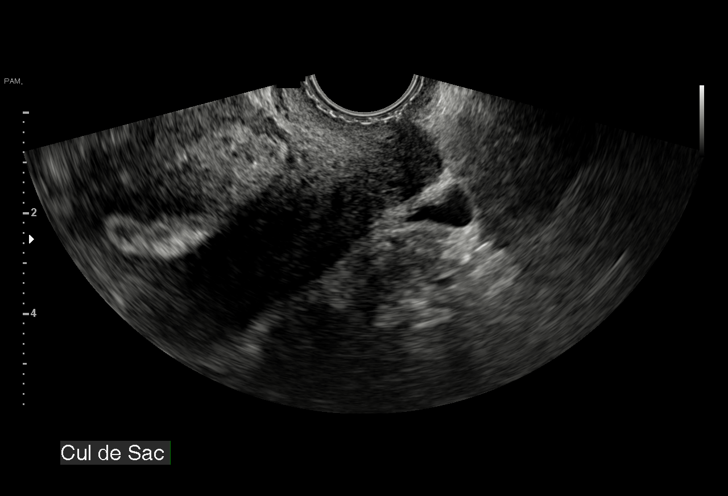
[im 3/36]
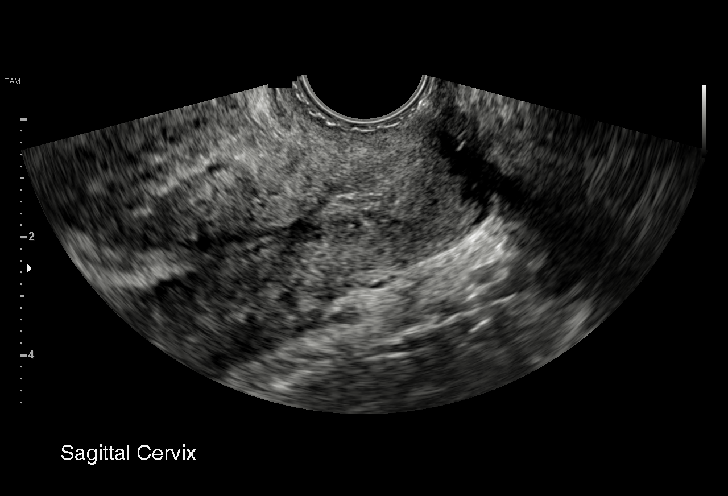
[im 6/36]
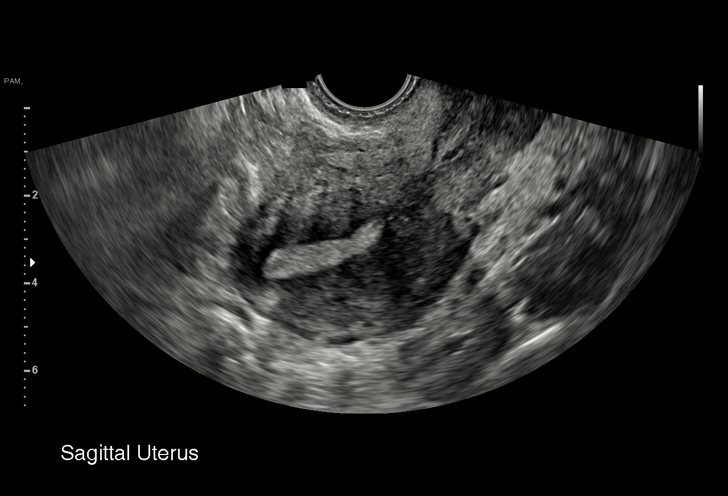
[im 8/36]
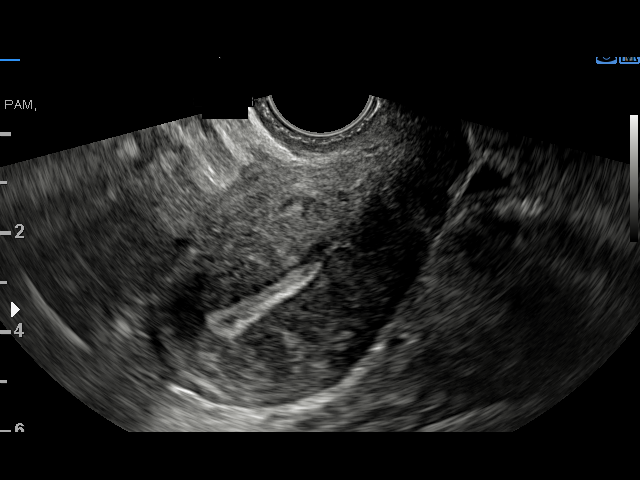
[im 11/36]
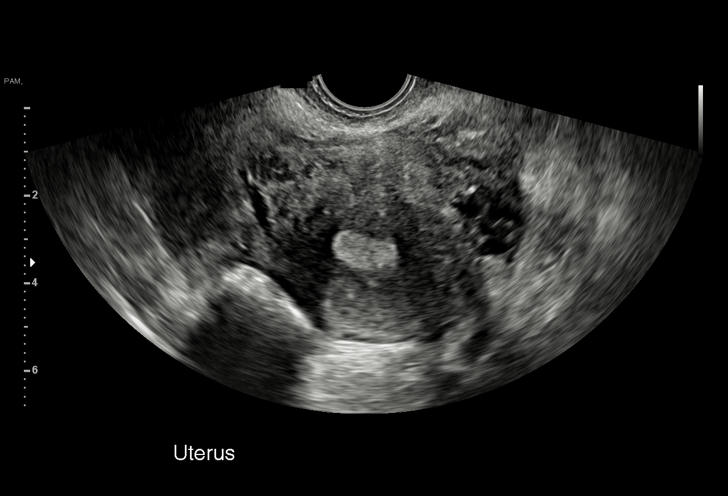
[im 13/36]
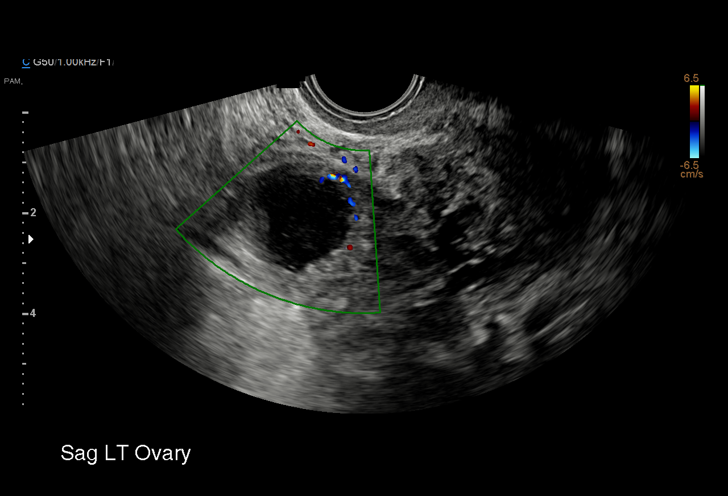
[im 16/36]
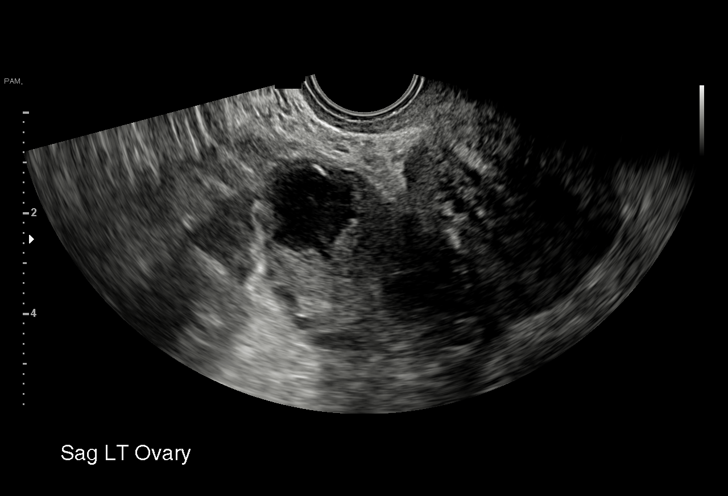
[im 19/36]
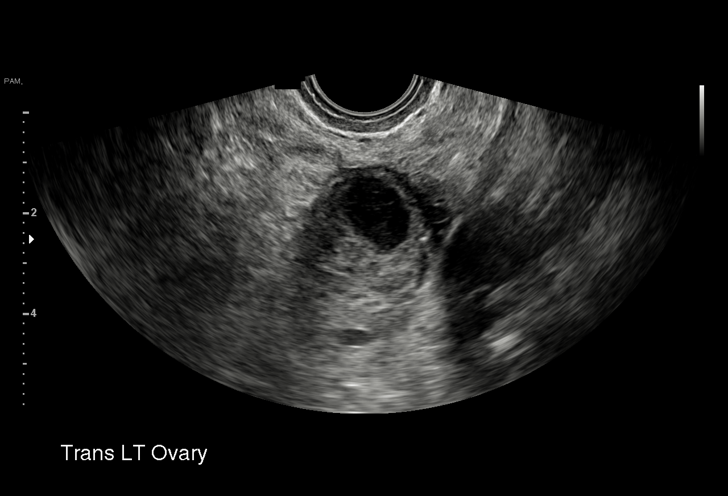
[im 20/36]
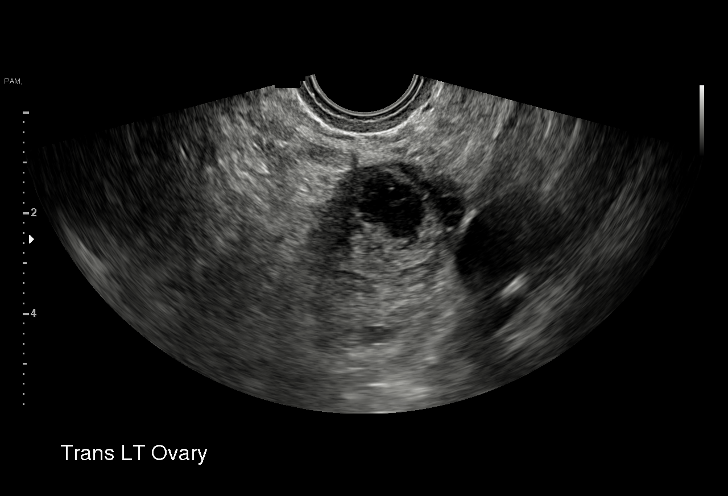
[im 23/36]
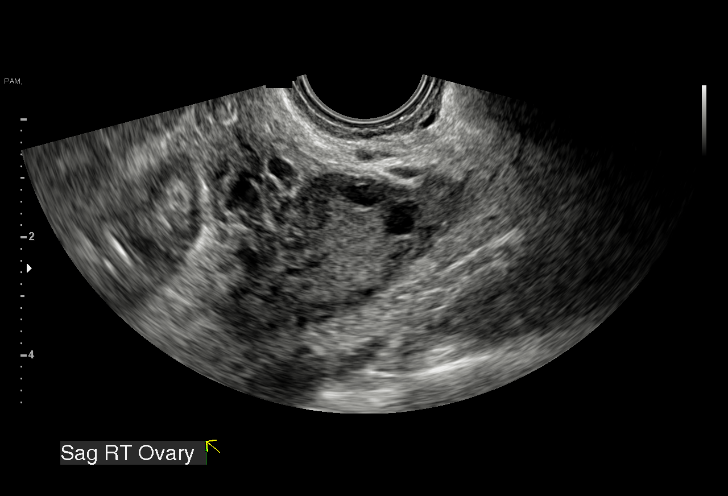
[im 25/36]
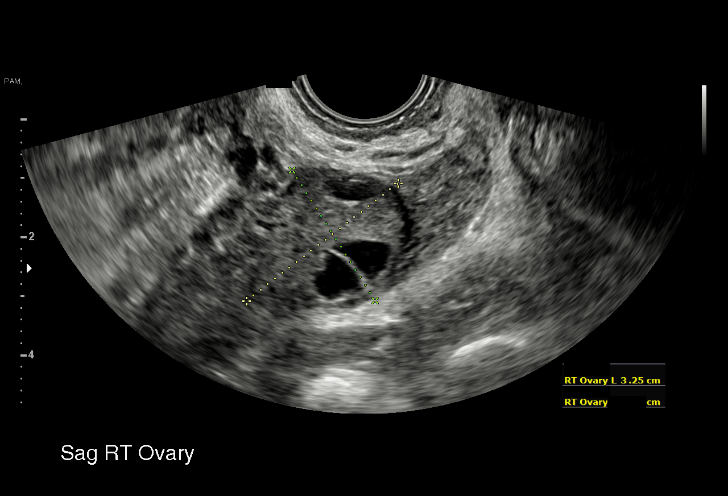
[im 28/36]
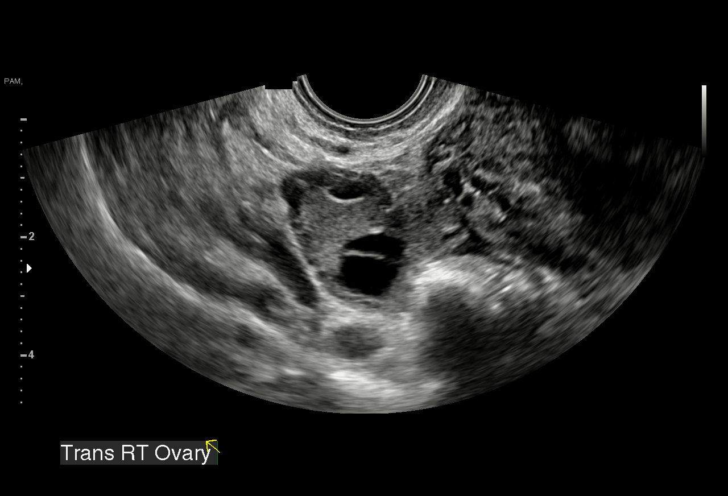
[im 30/36]
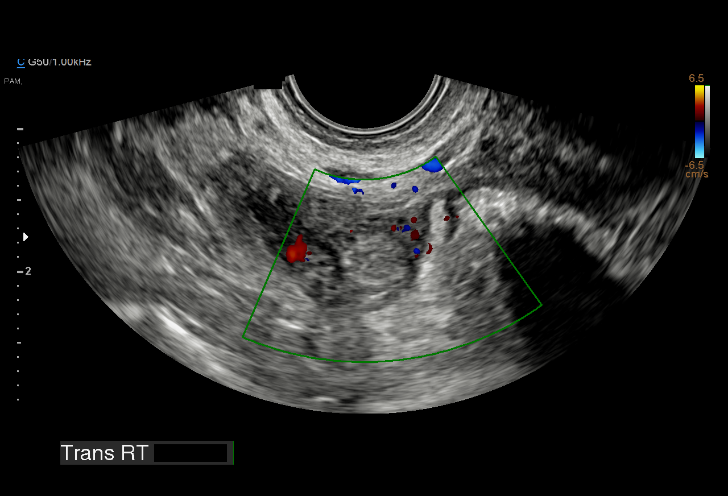
[im 33/36]
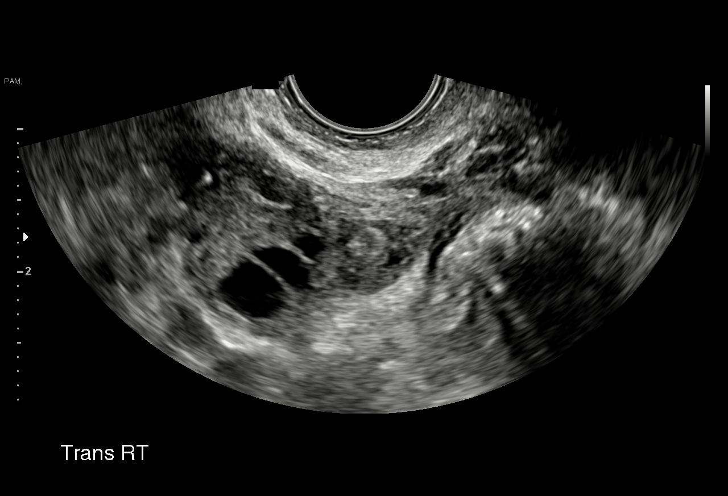
[im 36/36]
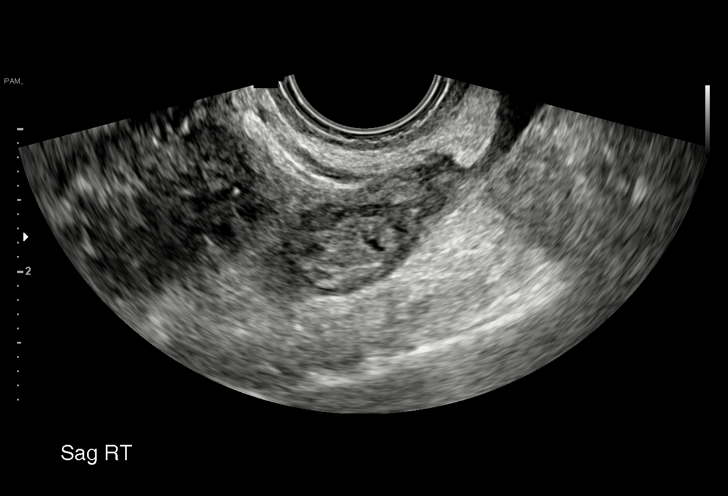

[15 of 28 positions shown; findings below may reference images not displayed]

FINDINGS: Intrauterine gestational sac: Not seen

Yolk sac:  Not seen

Embryo:  Not seen

Cardiac Activity: Not seen

Maternal uterus/adnexae: Left ovary contains probable hemorrhagic
corpus luteal cyst. The left ovary measures 2.9 x 4.1 x 3.3 cm.
Right ovary measures 3.3 x 2.6 x 2 cm. Adjacent to the right ovary
is a slightly echogenic adnexal mass measuring 1.7 x 1.3 cm with
peripheral flow. Trace free fluid.
IMPRESSION: No intrauterine pregnancy. Similar size and appearance of a right
adnexal slightly echogenic mass presumably corresponding to given
history of ectopic pregnancy. No large volume of hemorrhagic fluid
in the pelvis or adnexa.

## 2018-09-19 ENCOUNTER — Ambulatory Visit (INDEPENDENT_AMBULATORY_CARE_PROVIDER_SITE_OTHER): Payer: 59 | Admitting: Family Medicine

## 2018-09-19 DIAGNOSIS — Z23 Encounter for immunization: Secondary | ICD-10-CM

## 2018-09-19 NOTE — Addendum Note (Signed)
Addended by: Lorenda Hatchet R on: 09/19/2018 10:37 AM   Modules accepted: Level of Service

## 2018-12-26 ENCOUNTER — Encounter (HOSPITAL_COMMUNITY): Payer: Self-pay

## 2019-06-19 ENCOUNTER — Ambulatory Visit: Payer: 59 | Admitting: Adult Health Nurse Practitioner

## 2019-06-19 ENCOUNTER — Encounter: Payer: Self-pay | Admitting: Adult Health Nurse Practitioner

## 2019-06-19 ENCOUNTER — Other Ambulatory Visit: Payer: Self-pay

## 2019-06-19 VITALS — BP 97/56 | HR 71 | Temp 98.6°F | Resp 16 | Ht 67.0 in | Wt 171.0 lb

## 2019-06-19 DIAGNOSIS — Z23 Encounter for immunization: Secondary | ICD-10-CM

## 2019-06-19 DIAGNOSIS — N979 Female infertility, unspecified: Secondary | ICD-10-CM | POA: Diagnosis not present

## 2019-06-19 DIAGNOSIS — Z0000 Encounter for general adult medical examination without abnormal findings: Secondary | ICD-10-CM | POA: Insufficient documentation

## 2019-06-19 DIAGNOSIS — Z0001 Encounter for general adult medical examination with abnormal findings: Secondary | ICD-10-CM | POA: Diagnosis not present

## 2019-06-19 HISTORY — DX: Female infertility, unspecified: N97.9

## 2019-06-19 MED ORDER — CITALOPRAM HYDROBROMIDE 10 MG PO TABS: 20.0000 mg | ORAL_TABLET | Freq: Every day | ORAL | 6 refills | Status: DC

## 2019-06-19 NOTE — Patient Instructions (Addendum)
   If you have lab work done today you will be contacted with your lab results within the next 2 weeks.  If you have not heard from us then please contact us. The fastest way to get your results is to register for My Chart.   IF you received an x-ray today, you will receive an invoice from [facility]. Please contact [facility] at [phone] with questions or concerns regarding your invoice.   IF you received labwork today, you will receive an invoice from [facility]. Please contact [facility] at [phone] with questions or concerns regarding your invoice.   Our billing staff will not be able to assist you with questions regarding bills from these companies.  You will be contacted with the lab results as soon as they are available. The fastest way to get your results is to activate your My Chart account. Instructions are located on the last page of this paperwork. If you have not heard from us regarding the results in 2 weeks, please contact this office.      Health Maintenance, Female Adopting a healthy lifestyle and getting preventive care are important in promoting health and wellness. Ask your health care provider about:  The right schedule for you to have regular tests and exams.  Things you can do on your own to prevent diseases and keep yourself healthy. What should I know about diet, weight, and exercise? Eat a healthy diet   Eat a diet that includes plenty of vegetables, fruits, low-fat dairy products, and lean protein.  Do not eat a lot of foods that are high in solid fats, added sugars, or sodium. Maintain a healthy weight Body mass index (BMI) is used to identify weight problems. It estimates body fat based on height and weight. Your health care provider can help determine your BMI and help you achieve or maintain a healthy weight. Get regular exercise Get regular exercise. This is one of the most important things you can do for your health. Most  adults should:  Exercise for at least 150 minutes each week. The exercise should increase your heart rate and make you sweat (moderate-intensity exercise).  Do strengthening exercises at least twice a week. This is in addition to the moderate-intensity exercise.  Spend less time sitting. Even light physical activity can be beneficial. Watch cholesterol and blood lipids Have your blood tested for lipids and cholesterol at 38 years of age, then have this test every 5 years. Have your cholesterol levels checked more often if:  Your lipid or cholesterol levels are high.  You are older than 38 years of age.  You are at high risk for heart disease. What should I know about cancer screening? Depending on your health history and family history, you may need to have cancer screening at various ages. This may include screening for:  Breast cancer.  Cervical cancer.  Colorectal cancer.  Skin cancer.  Lung cancer. What should I know about heart disease, diabetes, and high blood pressure? Blood pressure and heart disease  High blood pressure causes heart disease and increases the risk of stroke. This is more likely to develop in people who have high blood pressure readings, are of African descent, or are overweight.  Have your blood pressure checked: ? Every 3-5 years if you are 18-39 years of age. ? Every year if you are 40 years old or older. Diabetes Have regular diabetes screenings. This checks your fasting blood sugar level. Have the screening done:  Once every   three years after age 40 if you are at a normal weight and have a low risk for diabetes.  More often and at a younger age if you are overweight or have a high risk for diabetes. What should I know about preventing infection? Hepatitis B If you have a higher risk for hepatitis B, you should be screened for this virus. Talk with your health care provider to find out if you are at risk for hepatitis B infection. Hepatitis  C Testing is recommended for:  Everyone born from 1945 through 1965.  Anyone with known risk factors for hepatitis C. Sexually transmitted infections (STIs)  Get screened for STIs, including gonorrhea and chlamydia, if: ? You are sexually active and are younger than 38 years of age. ? You are older than 38 years of age and your health care provider tells you that you are at risk for this type of infection. ? Your sexual activity has changed since you were last screened, and you are at increased risk for chlamydia or gonorrhea. Ask your health care provider if you are at risk.  Ask your health care provider about whether you are at high risk for HIV. Your health care provider may recommend a prescription medicine to help prevent HIV infection. If you choose to take medicine to prevent HIV, you should first get tested for HIV. You should then be tested every 3 months for as long as you are taking the medicine. Pregnancy  If you are about to stop having your period (premenopausal) and you may become pregnant, seek counseling before you get pregnant.  Take 400 to 800 micrograms (mcg) of folic acid every day if you become pregnant.  Ask for birth control (contraception) if you want to prevent pregnancy. Osteoporosis and menopause Osteoporosis is a disease in which the bones lose minerals and strength with aging. This can result in bone fractures. If you are 65 years old or older, or if you are at risk for osteoporosis and fractures, ask your health care provider if you should:  Be screened for bone loss.  Take a calcium or vitamin D supplement to lower your risk of fractures.  Be given hormone replacement therapy (HRT) to treat symptoms of menopause. Follow these instructions at home: Lifestyle  Do not use any products that contain nicotine or tobacco, such as cigarettes, e-cigarettes, and chewing tobacco. If you need help quitting, ask your health care provider.  Do not use street  drugs.  Do not share needles.  Ask your health care provider for help if you need support or information about quitting drugs. Alcohol use  Do not drink alcohol if: ? Your health care provider tells you not to drink. ? You are pregnant, may be pregnant, or are planning to become pregnant.  If you drink alcohol: ? Limit how much you use to 0-1 drink a day. ? Limit intake if you are breastfeeding.  Be aware of how much alcohol is in your drink. In the [place] one drink equals one 12 oz bottle of beer (355 mL), one 5 oz glass of wine (148 mL), or one 1 oz glass of hard liquor (44 mL). General instructions  Schedule regular health, dental, and eye exams.  Stay current with your vaccines.  Tell your health care provider if: ? You often feel depressed. ? You have ever been abused or do not feel safe at home. Summary  Adopting a healthy lifestyle and getting preventive care are important in promoting health and wellness.    Follow your health care provider's instructions about healthy diet, exercising, and getting tested or screened for diseases.  Follow your health care provider's instructions on monitoring your cholesterol and blood pressure. This information is not intended to replace advice given to you by your health care provider. Make sure you discuss any questions you have with your health care provider. Document Released: 03/13/2011 Document Revised: 08/21/2018 Document Reviewed: 08/21/2018 Elsevier Patient Education  2020 Elsevier Inc.  

## 2019-06-19 NOTE — Progress Notes (Signed)
Chief Complaint  Patient presents with  . Annual Exam    HPI   Patient presents for an annual physical exam today.  Recently had a PAP smear at [facility].  She is working with them for secondary infertility.  She and her husband have one child who is 6.  She had an ectopic >1 year ago and was on Femara for 7 months. According to her OB, she may need explore IVF and has an appointment tomorrow morning.   Hx of depression/anxiety.  She feels she is stable on Celexa but admits during this time in the pandemic, she is down.  No suicidal ideation.  Has not had a panic attack in 1 year.    In addition, she is seeing ortho for her plantar fascitis.  She is getting an MRI of her left foot to rule out any other process. Prior to this, she ran marathons.       Past Medical History:  Diagnosis Date  . Allergy   . Anxiety   . Asthma    childhood  . Depression   . 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 Physical exam, annual 06/19/2019    Current Outpatient Medications  Medication Sig Dispense Refill  . ALPRAZolam (XANAX) 0.25 MG tablet Take 1 tablet (0.25 mg total) by mouth 2 (two) times daily as needed for anxiety. (Patient not taking: Reported on 07/19/2018) 20 tablet 0  . citalopram (CELEXA) 10 MG tablet Take 2 tablets (20 mg total) by mouth daily. 30 tablet 6   No current facility-administered medications for this visit.     Allergies: No Known Allergies  Past Surgical History:  Procedure Laterality Date  . L wrist surgery    . WISDOM TOOTH EXTRACTION      Social History   Socioeconomic History  . Marital status: Married    Spouse name: Not on file  . Number of children: 1  . Years of education: Not on file  . Highest education level: Not on file  Occupational History  . Occupation: homemaker  Social Needs  . Financial resource strain: Not on file  . Food insecurity    Worry: Not on file    Inability: Not on file  . Transportation needs    Medical: Not on  file    Non-medical: Not on file  Tobacco Use  . Smoking status: Never Smoker  . Smokeless tobacco: Never Used  Substance and Sexual Activity  . Alcohol use: Yes    Alcohol/week: 3.0 standard drinks    Types: 3 Standard drinks or equivalent per week  . Drug use: No  . Sexual activity: Yes  Lifestyle  . Physical activity    Days per week: Not on file    Minutes per session: Not on file  . Stress: Not on file  Relationships  . Social Musician on phone: Not on file    Gets together: Not on file    Attends religious service: Not on file    Active member of club or organization: Not on file    Attends meetings of clubs or organizations: Not on file    Relationship status: Not on file  Other Topics Concern  . 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      Tobacco: none      Alcohol: weekends; one glass of wine per night.      Exercise:  3-4 days per week; running 3-4 miles      Seatbelt: 100%; no texting    Family History  Problem Relation Age of Onset  . Hypertension Mother   . Cancer Maternal Grandmother        Ovarian  . Alcohol abuse Maternal Grandfather   . High Cholesterol Paternal Grandmother   . Hypertension Paternal Grandmother   . Diabetes Paternal Grandfather   . Heart disease Paternal Grandfather   . Hearing loss Paternal Grandfather   . Hypertension Paternal Grandfather   . Stroke Paternal Grandfather      Review of Systems  Constitutional: Negative for activity change, appetite change, chills and fever.  HENT: Negative for congestion, nosebleeds, trouble swallowing and voice change.   Respiratory: Negative for cough, shortness of breath and wheezing.   Gastrointestinal: Negative for diarrhea, nausea and vomiting.  Genitourinary:  Negative for difficulty urinating, dysuria, flank pain and hematuria.  Musculoskeletal: Negative for back pain, joint swelling and neck pain.  Neurological: Negative for dizziness, speech difficulty, light-headedness and numbness.  See HPI. All other review of systems negative.      Objective: Vitals:   06/19/19 1341  BP: (!) 97/56  Pulse: 71  Resp: 16  Temp: 98.6 F (37 C)  TempSrc: Oral  SpO2: 98%  Weight: 171 lb (77.6 kg)  Height: 5\' 7"  (1.702 m)    Physical Exam   Physical Exam  Constitutional: Oriented to person, place, and time. Appears well-developed and well-nourished.  HENT:  Head: Normocephalic and atraumatic.  Eyes: Conjunctivae and EOM are normal.  Cardiovascular: Normal rate, regular rhythm, normal heart sounds and intact distal pulses.  No murmur heard. Pulmonary/Chest: Effort normal and breath sounds normal. No stridor. No respiratory distress. Has no wheezes.  Neurological: Is alert and oriented to person, place, and time.  Skin: Skin is warm. Capillary refill takes less than 2 seconds.  Psychiatric: Has a normal mood and affect. Behavior is normal. Judgment and thought content normal.   Assessment and Plan Shayona was seen today for annual exam.  Diagnoses and all orders for this visit:  Need for influenza vaccination -     Flu Vaccine QUAD 36+ mos IM  Physical exam, annual -     CBC with Differential/Platelet; Future -     Comprehensive metabolic panel; Future -     Hemoglobin A1c; Future -     Lipid panel; Future -     TSH; Future  Depression  -     citalopram (CELEXA) 10 MG tablet; Take 2 tablets (20 mg total) by mouth daily.  All questions were answered.     Glyn Ade

## 2019-06-20 ENCOUNTER — Ambulatory Visit: Payer: 59

## 2019-06-26 ENCOUNTER — Ambulatory Visit: Payer: 59

## 2019-06-27 ENCOUNTER — Ambulatory Visit (INDEPENDENT_AMBULATORY_CARE_PROVIDER_SITE_OTHER): Payer: 59 | Admitting: Adult Health Nurse Practitioner

## 2019-06-27 ENCOUNTER — Other Ambulatory Visit: Payer: Self-pay

## 2019-06-27 DIAGNOSIS — Z0000 Encounter for general adult medical examination without abnormal findings: Secondary | ICD-10-CM

## 2019-06-28 LAB — COMPREHENSIVE METABOLIC PANEL
ALT: 11 IU/L (ref 0–32)
AST: 16 IU/L (ref 0–40)
Albumin/Globulin Ratio: 1.6 (ref 1.2–2.2)
Albumin: 4.2 g/dL (ref 3.8–4.8)
Alkaline Phosphatase: 42 IU/L (ref 39–117)
BUN/Creatinine Ratio: 14 (ref 9–23)
BUN: 11 mg/dL (ref 6–20)
Bilirubin Total: 0.6 mg/dL (ref 0.0–1.2)
CO2: 23 mmol/L (ref 20–29)
Calcium: 8.8 mg/dL (ref 8.7–10.2)
Chloride: 104 mmol/L (ref 96–106)
Creatinine, Ser: 0.81 mg/dL (ref 0.57–1.00)
GFR calc Af Amer: 107 mL/min/{1.73_m2} (ref >59)
GFR calc non Af Amer: 92 mL/min/{1.73_m2} (ref >59)
Globulin, Total: 2.7 g/dL (ref 1.5–4.5)
Glucose: 86 mg/dL (ref 65–99)
Potassium: 4.3 mmol/L (ref 3.5–5.2)
Sodium: 138 mmol/L (ref 134–144)
Total Protein: 6.9 g/dL (ref 6.0–8.5)

## 2019-06-28 LAB — LIPID PANEL
Chol/HDL Ratio: 2.6 ratio (ref 0.0–4.4)
Cholesterol, Total: 170 mg/dL (ref 100–199)
HDL: 65 mg/dL (ref >39)
LDL Chol Calc (NIH): 94 mg/dL (ref 0–99)
Triglycerides: 53 mg/dL (ref 0–149)
VLDL Cholesterol Cal: 11 mg/dL (ref 5–40)

## 2019-06-28 LAB — CBC WITH DIFFERENTIAL/PLATELET
Basophils Absolute: 0 10*3/uL (ref 0.0–0.2)
Basos: 1 %
EOS (ABSOLUTE): 0.2 10*3/uL (ref 0.0–0.4)
Eos: 2 %
Hematocrit: 40 % (ref 34.0–46.6)
Hemoglobin: 13.9 g/dL (ref 11.1–15.9)
Immature Grans (Abs): 0 10*3/uL (ref 0.0–0.1)
Immature Granulocytes: 0 %
Lymphocytes Absolute: 2 10*3/uL (ref 0.7–3.1)
Lymphs: 29 %
MCH: 32.2 pg (ref 26.6–33.0)
MCHC: 34.8 g/dL (ref 31.5–35.7)
MCV: 93 fL (ref 79–97)
Monocytes Absolute: 0.5 10*3/uL (ref 0.1–0.9)
Monocytes: 8 %
Neutrophils Absolute: 4.1 10*3/uL (ref 1.4–7.0)
Neutrophils: 60 %
Platelets: 247 10*3/uL (ref 150–450)
RBC: 4.32 x10E6/uL (ref 3.77–5.28)
RDW: 11.4 % — ABNORMAL LOW (ref 11.7–15.4)
WBC: 6.8 10*3/uL (ref 3.4–10.8)

## 2019-06-28 LAB — TSH: TSH: 1.84 u[IU]/mL (ref 0.450–4.500)

## 2019-06-28 LAB — HEMOGLOBIN A1C
Est. average glucose Bld gHb Est-mCnc: 82 mg/dL
Hgb A1c MFr Bld: 4.5 % — ABNORMAL LOW (ref 4.8–5.6)

## 2019-07-02 NOTE — Progress Notes (Signed)
Your lab results are within the expected ranges. Continue current plan discussed in the office. Keep your follow up appointment.  For questions please call [phone] 

## 2019-11-05 ENCOUNTER — Telehealth: Payer: 59 | Admitting: Adult Health Nurse Practitioner

## 2019-11-05 ENCOUNTER — Other Ambulatory Visit: Payer: Self-pay

## 2019-11-05 NOTE — Patient Instructions (Signed)
° ° ° °  If you have lab work done today you will be contacted with your lab results within the next 2 weeks.  If you have not heard from us then please contact us. The fastest way to get your results is to register for My Chart. ° ° °IF you received an x-ray today, you will receive an invoice from [facility]. Please contact [facility] at [phone] with questions or concerns regarding your invoice.  ° °IF you received labwork today, you will receive an invoice from [facility]. Please contact [facility] at [phone] with questions or concerns regarding your invoice.  ° °Our billing staff will not be able to assist you with questions regarding bills from these companies. ° °You will be contacted with the lab results as soon as they are available. The fastest way to get your results is to activate your My Chart account. Instructions are located on the last page of this paperwork. If you have not heard from us regarding the results in 2 weeks, please contact this office. °  ° ° ° °

## 2019-11-14 ENCOUNTER — Ambulatory Visit: Payer: 59 | Admitting: Registered Nurse

## 2019-11-14 ENCOUNTER — Other Ambulatory Visit: Payer: Self-pay

## 2019-11-17 ENCOUNTER — Encounter: Payer: Self-pay | Admitting: Gastroenterology

## 2019-12-18 ENCOUNTER — Encounter: Payer: Self-pay | Admitting: Gastroenterology

## 2019-12-18 ENCOUNTER — Ambulatory Visit: Payer: 59 | Admitting: Gastroenterology

## 2019-12-18 VITALS — BP 100/60 | HR 75 | Temp 98.5°F | Ht 67.0 in | Wt 180.0 lb

## 2019-12-18 DIAGNOSIS — R1013 Epigastric pain: Secondary | ICD-10-CM | POA: Diagnosis not present

## 2019-12-18 DIAGNOSIS — Z791 Long term (current) use of non-steroidal anti-inflammatories (NSAID): Secondary | ICD-10-CM

## 2019-12-18 MED ORDER — PANTOPRAZOLE SODIUM 40 MG PO TBEC: 40.0000 mg | DELAYED_RELEASE_TABLET | Freq: Every day | ORAL | 3 refills | Status: DC

## 2019-12-18 NOTE — Patient Instructions (Signed)
We have sent the following medications to your pharmacy for you to pick up at your convenience:  Pantoprazole 40 mg every morning   Avoid NSAIDs as much as possible   You have been scheduled for an H. Pylori/Urea Breath Test.  Please follow the instructions below to prepare for your test:  1. One day prior to your test avoid high gas foods, high fiber goods, fruits and fruit juices, vegetables, beans, cereals, fiber supplements, onion and bell peppers. You may eat hamburger, chicken, beef, tuna fish and eggs.  2. You should have nothing to eat or drink after 9:00 pm on the day before your test.   3. Do not smoke, chew gum or eat hard candy the morning of your test.  4. You may not have the test within 2 weeks after taking any antibiotics or within 4 weeks if confirming eradication of H.Pylori.  5. 2 weeks prior to your test stop any Pepto-Bismol and/or proton pump inhibitors (Prevacid, Zegerid, Nexium, Prilosec, Protonix, Aciphex, Dexilant).  6. You may use over the counter strength Axid, Pepcid, Tagamet and Zantac 2 weeks prior to your tests. However, you should STOP these 24 hours before the test. You may use Tums and Maalox.

## 2019-12-18 NOTE — Progress Notes (Signed)
Referring Provider: No ref. provider found Primary Care Physician:  Patient, No Pcp Per  Reason for Consultation: GERD   IMPRESSION:  Epigastric pain, improving after one month of daily PPI Melaxicam for plantar fascittis No known family history of colon cancer or polyps  Suspected NSAID related gastritis, erosions, or PUD. Esophagitis and H pylori are also possible, particularly given her father's history of suspected esophagitis. Less likely symptomatic gallbladder disease  PLAN: H pylori breath test Pantoprazole 40 mg QAM for at least 8-12 weels Avoid all NSAIDs as able Consider EGD if not responding to medical therapy or with the development of any alarm features   HPI: Jasmine Bullock is a 39 y.o. female self-referred for reflux.The history is obtained through the patient and review of her electronic health record. She has a history of depression, anxiety, and plantar fasciitis. Working to establish care with Dr. Billey Chang. Dr. Renford Dills is her BIL. 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   Presents today with dull epigastric ache that became progressively worse. Initially developed 2 months ago after eating chocolate espresso beans. Associated nausea, eructation. No brash, cough, dysphagia, odynophagia, dysphonia, no early satiety. Worsened by eating even on a bland diet and green tea. No change off coffee.  Responded to PeptoBismal during the first couple of episodes but no longer having any relief. Normal appetite. Unintentional weight gain related to plantar fasciitis.  No response to Tums, Pepcid, gum alka seltzer.  Seen at [facility] on [address] after no response to H2Blocker.  Was told it could be an ulcer. Was given Protonix 40 mg daily x 1 month. No symptoms since that time.  Father with history of ulcers requiring treatment with antibiotics.  She is concerned that she may have had an ulcer.   Taking meloxicam for plantar fasciitis. Trying to use  ibuprofen to minimize meloxicam use.   Labs from 06/27/2019 show normal comprehensive metabolic panel and normal CBC including a hemoglobin of 13.9.  Paternal uncle had ulcers. Maternal grandmother had ovarian cancer. No known family history of colon cancer or polyps. No family history of uterine/endometrial cancer, pancreatic cancer or gastric/stomach cancer.   Past Medical History:  Diagnosis Date  . Allergy   . Anxiety   . Asthma    childhood  . Depression   . 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 Physical exam, annual 06/19/2019    Past Surgical History:  Procedure Laterality Date  . L wrist surgery    . WISDOM TOOTH EXTRACTION      Current Outpatient Medications  Medication Sig Dispense Refill  . ALPRAZolam (XANAX) 0.25 MG tablet Take 1 tablet (0.25 mg total) by mouth 2 (two) times daily as needed for anxiety. (Patient not taking: Reported on 07/19/2018) 20 tablet 0  . citalopram (CELEXA) 10 MG tablet Take 2 tablets (20 mg total) by mouth daily. 30 tablet 6   No current facility-administered medications for this visit.    Allergies as of 12/18/2019  . (No Known Allergies)    Family History  Problem Relation Age of Onset  . Hypertension Mother   . Cancer Maternal Grandmother        Ovarian  . Alcohol abuse Maternal Grandfather   . High Cholesterol Paternal Grandmother   . Hypertension Paternal Grandmother   . Diabetes Paternal Grandfather   . Heart disease Paternal Grandfather   . Hearing loss Paternal Grandfather   . Hypertension Paternal Grandfather   . Stroke Paternal Grandfather  Social History   Socioeconomic History  . Marital status: Married    Spouse name: Not on file  . Number of children: 1  . Years of education: Not on file  . Highest education level: Not on file  Occupational History  . Occupation: homemaker  Tobacco Use  . Smoking status: Never Smoker  . Smokeless tobacco: Never Used  Substance and Sexual Activity  . Alcohol use:  Yes    Alcohol/week: 3.0 standard drinks    Types: 3 Standard drinks or equivalent per week  . Drug use: No  . Sexual activity: Yes  Other Topics Concern  . 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      Tobacco: none      Alcohol: weekends; one glass of wine per night.      Exercise:  3-4 days per week; running 3-4 miles      Seatbelt: 100%; no texting   Social Determinants of Health   Financial Resource Strain:   . Difficulty of Paying Living Expenses:   Food Insecurity:   . Worried About Programme researcher, broadcasting/film/video in the Last Year:   . Barista in the Last Year:   Transportation Needs:   . Freight forwarder (Medical):   Marland Kitchen Lack of Transportation (Non-Medical):   Physical Activity:   . Days of Exercise per Week:   . Minutes of Exercise per Session:   Stress:   . Feeling of Stress :   Social Connections:   . Frequency of Communication with Friends and Family:   . Frequency of Social Gatherings with Friends and Family:   . Attends Religious Services:   . Active Member of Clubs or Organizations:   . Attends Banker Meetings:   Marland Kitchen Marital Status:   Intimate Partner Violence:   . Fear of Current or Ex-Partner:   . Emotionally Abused:   Marland Kitchen Physically Abused:   . Sexually Abused:     Review of Systems: 12 system ROS is negative except as noted above.   Physical Exam: General:   Alert,  well-nourished, pleasant and cooperative in NAD Head:  Normocephalic and atraumatic. Eyes:  Sclera clear, no icterus.   Conjunctiva pink. Ears:  Normal auditory acuity. Nose:  No deformity, discharge,  or lesions. Mouth:  No deformity or lesions.   Neck:  Supple; no masses or thyromegaly. Lungs:  Clear throughout to auscultation.   No wheezes. Heart:  Regular  rate and rhythm; no murmurs. Abdomen:  Soft, nontender, nondistended, normal bowel sounds, no rebound or guarding. No hepatosplenomegaly.   Rectal:  Deferred  Msk:  Symmetrical. No boney deformities LAD: No inguinal or umbilical LAD Extremities:  No clubbing or edema. Neurologic:  Alert and  oriented x4;  grossly nonfocal Skin:  Intact without significant lesions or rashes. Psych:  Alert and cooperative. Normal mood and affect.    Krystall Kruckenberg L. Orvan Falconer, MD, MPH 12/18/2019, 1:23 PM

## 2020-01-19 ENCOUNTER — Telehealth: Payer: Self-pay

## 2020-01-19 NOTE — Telephone Encounter (Signed)
Left message for pt to call back  °

## 2020-01-19 NOTE — Telephone Encounter (Signed)
-----   Message from Tressia Danas, MD sent at 01/18/2020 11:56 AM EDT ----- Please call the patient.  I reviewed her H. pylori test.  It was negative.  I wanted to check in on her symptoms.  As we discussed at the time for consultation, if she is not feeling better the next step would be an upper endoscopy.  Thank you.

## 2020-01-19 NOTE — Telephone Encounter (Signed)
Spoke with pt and she is aware and states she has not had any more problems. Pt knows to call back if she has any other issues.

## 2020-01-27 ENCOUNTER — Other Ambulatory Visit: Payer: Self-pay

## 2020-01-27 HISTORY — PX: PLANTAR FASCIA RELEASE: SHX2239

## 2020-01-28 ENCOUNTER — Encounter: Payer: Self-pay | Admitting: Family Medicine

## 2020-01-28 ENCOUNTER — Ambulatory Visit (INDEPENDENT_AMBULATORY_CARE_PROVIDER_SITE_OTHER): Payer: 59 | Admitting: Family Medicine

## 2020-01-28 DIAGNOSIS — T39395A Adverse effect of other nonsteroidal anti-inflammatory drugs [NSAID], initial encounter: Secondary | ICD-10-CM | POA: Diagnosis not present

## 2020-01-28 DIAGNOSIS — M722 Plantar fascial fibromatosis: Secondary | ICD-10-CM | POA: Diagnosis not present

## 2020-01-28 DIAGNOSIS — K219 Gastro-esophageal reflux disease without esophagitis: Secondary | ICD-10-CM

## 2020-01-28 DIAGNOSIS — K2960 Other gastritis without bleeding: Secondary | ICD-10-CM

## 2020-01-28 DIAGNOSIS — F411 Generalized anxiety disorder: Secondary | ICD-10-CM | POA: Insufficient documentation

## 2020-01-28 HISTORY — DX: Gastro-esophageal reflux disease without esophagitis: K21.9

## 2020-01-28 NOTE — Progress Notes (Signed)
Subjective  CC:  Chief Complaint  Patient presents with  . New Patient (Initial Visit)    No concerns.   . Anxiety  . Gastroesophageal Reflux   Virtual Visit via Video Note I connected with Jasmine Bullock on 01/28/20 at 11:00 AM EDT by a video enabled telemedicine application and verified that I am speaking with the correct person using two identifiers. Location patient: Home Location provider: [facility] participating in the virtual visit: Jasmine Bullock, Leamon Arnt, MD Reymundo Poll, [place] discussed the limitations of evaluation and management by telemedicine and the availability of in person appointments. The patient expressed understanding and agreed to proceed.   HPI: Jasmine Bullock is a 39 y.o. female who presents to [facility] [place] today to establish care with me as a new patient.   She has the following concerns or needs:  Plantar fasciitis release was done yesterday by Dr. Doran Durand; hurting but hopeful. Has been dealing with it for a few years.   GERD: has seen Dr. Tarri Glenn and I have reviewed her notes and labs. Had taken PPI which resolved her problems. H.pylor breath test was negative. Now she had no more GERD/ nsaid induced gastritis.   GAD: has been on citalopram for years. Uses prn xanax. Very well controlled.   Assessment  1. GAD (generalized anxiety disorder)   2. NSAID induced gastritis   3. Plantar fasciitis      Plan   GAD: well controlled. Agree with current careplan. No changes. Will assess need for meds annually.   NSAID induced gastritis: resolved. Monitor for recurrent gerd sxs and limit nsaid use  Plantar fasciitis release:on asa, pain meds and rest.f/u with ortho scheduled.   HM: up to date.   Follow up:  06/2020 for cpe  No orders of the defined types were placed in this encounter.  No orders of the defined types were placed in this encounter.    Depression screen [facility] 2/9  11/05/2019 06/19/2019 03/08/2018 06/11/2017 10/30/2016  Decreased Interest 0 0 2 0 0  Down, Depressed, Hopeless 0 0 2 0 0  PHQ - 2 Score 0 0 4 0 0  Altered sleeping - - 0 - -  Tired, decreased energy - - 2 - -  Change in appetite - - 0 - -  Feeling bad or failure about yourself  - - 0 - -  Trouble concentrating - - 0 - -  Moving slowly or fidgety/restless - - 0 - -  Suicidal thoughts - - 0 - -  PHQ-9 Score - - 6 - -  Difficult doing work/chores - - Somewhat difficult - -    We updated and reviewed the patient's past history in detail and it is documented below.  Patient Active Problem List   Diagnosis Date Noted  . GAD (generalized anxiety disorder) 01/28/2020    Priority: High    Diagnosed initially in early 20s; has been well controlled on celexa and prn xanax. Has had panic attacks. Mom and MGM have same.    . NSAID induced gastritis 01/28/2020    Priority: Medium    2020 due to mobic use for PF;resolved with one course of PPI, Dr. Tarri Glenn   . Female infertility, secondary 06/19/2019    Priority: Low   Health Maintenance  Topic Date Due  . INFLUENZA VACCINE  04/11/2020  . PAP SMEAR-Modifier  05/20/2022  . TETANUS/TDAP  09/11/2022  .  COVID-19 Vaccine  Completed  . HIV Screening  Completed   Immunization History  Administered Date(s) Administered  . Influenza,inj,Quad PF,6+ Mos 06/12/2014, 05/16/2016, 07/06/2017, 09/19/2018, 06/19/2019  . PFIZER SARS-COV-2 Vaccination 09/24/2019, 10/16/2019  . PPD Test 10/27/2016  . Tdap 05/26/2009, 09/11/2012   Current Meds  Medication Sig  . citalopram (CELEXA) 20 MG tablet Take 1 tablet by mouth daily.  Marland Kitchen oxyCODONE (OXY IR/ROXICODONE) 5 MG immediate release tablet Take 5 mg by mouth as needed.   . [DISCONTINUED] ASPIRIN LOW DOSE 81 MG EC tablet Take 81 mg by mouth daily.  . [DISCONTINUED] pantoprazole (PROTONIX) 40 MG tablet Take 1 tablet (40 mg total) by mouth daily.    Allergies: Patient has No Known Allergies. Past Medical  History Patient  has a past medical history of Allergy, Anxiety, Asthma, Depression, Female infertility, secondary (06/19/2019), GERD (gastroesophageal reflux disease) (01/28/2020), and Plantar fasciitis. Past Surgical History Patient  has a past surgical history that includes L wrist surgery; Wisdom tooth extraction; and Plantar fascia release (Left, 01/27/2020). Family History: Patient family history includes Alcohol abuse in her maternal grandfather; Diabetes in her paternal grandfather; Healthy in her sister and son; Hearing loss in her paternal grandfather; Heart disease in her paternal grandfather; Hyperlipidemia in her paternal grandmother; Hypertension in her mother, paternal grandfather, and paternal grandmother; Ovarian cancer in her maternal grandmother; Stroke in her paternal grandfather; Ulcers in her father. Social History:  Patient  reports that she has never smoked. She has never used smokeless tobacco. She reports current alcohol use of about 3.0 standard drinks of alcohol per week. She reports that she does not use drugs.  Review of Systems: Constitutional: negative for fever or malaise Ophthalmic: negative for photophobia, double vision or loss of vision Cardiovascular: negative for chest pain, dyspnea on exertion, or new LE swelling Respiratory: negative for SOB or persistent cough Gastrointestinal: negative for abdominal pain, change in bowel habits or melena Genitourinary: negative for dysuria or gross hematuria Integumentary: negative for new or persistent rashes Neurological: negative for TIA or stroke symptoms Psychiatric: negative for SI or delusions Allergic/Immunologic: negative for hives  Patient Care Team    Relationship Specialty Notifications Start End  Willow Ora, MD PCP - General Family Medicine  01/28/20   Tressia Danas, MD Consulting Physician Gastroenterology  01/28/20   Toni Arthurs, MD Consulting Physician Orthopedic Surgery  01/28/20   Noland Fordyce, MD Consulting Physician Obstetrics and Gynecology  01/28/20     Objective  Vitals: There were no vitals taken for this visit. General:  Well developed, well nourished, no acute distress  Psych:  Alert and oriented,normal mood and affect   Commons side effects, risks, benefits, and alternatives for medications and treatment plan prescribed today were discussed, and the patient expressed understanding of the given instructions. Patient is instructed to call or message via MyChart if he/she has any questions or concerns regarding our treatment plan. No barriers to understanding were identified. We discussed Red Flag symptoms and signs in detail. Patient expressed understanding regarding what to do in case of urgent or emergency type symptoms.   Medication list was reconciled, printed and provided to the patient in AVS. Patient instructions and summary information was reviewed with the patient as documented in the AVS. This note was prepared with assistance of Dragon voice recognition software. Occasional wrong-word or sound-a-like substitutions may have occurred due to the inherent limitations of voice recognition software  This visit occurred during the SARS-CoV-2 public health emergency.  Safety protocols were  in place, including screening questions prior to the visit, additional usage of staff PPE, and extensive cleaning of exam room while observing appropriate contact time as indicated for disinfecting solutions.

## 2020-02-18 ENCOUNTER — Telehealth: Payer: Self-pay | Admitting: Family Medicine

## 2020-02-18 NOTE — Telephone Encounter (Signed)
Needs office visit please

## 2020-02-18 NOTE — Telephone Encounter (Signed)
Please call to schedule patient.

## 2020-02-18 NOTE — Telephone Encounter (Signed)
Patient called in this afternoon to ask for a referral to have a sleep study done, states when she had surgery recently she was told that she has having a lot of breathing issues to the point where she had to have a tube put in to help assist.

## 2020-02-19 ENCOUNTER — Other Ambulatory Visit: Payer: Self-pay

## 2020-02-19 NOTE — Telephone Encounter (Signed)
Patient is scheduled for tomorrow at 9.  

## 2020-02-20 ENCOUNTER — Ambulatory Visit (INDEPENDENT_AMBULATORY_CARE_PROVIDER_SITE_OTHER): Payer: 59 | Admitting: Family Medicine

## 2020-02-20 ENCOUNTER — Encounter: Payer: Self-pay | Admitting: Family Medicine

## 2020-02-20 VITALS — BP 118/70 | HR 70 | Temp 98.5°F | Resp 18 | Ht 67.0 in | Wt 173.4 lb

## 2020-02-20 DIAGNOSIS — F411 Generalized anxiety disorder: Secondary | ICD-10-CM | POA: Diagnosis not present

## 2020-02-20 DIAGNOSIS — R0683 Snoring: Secondary | ICD-10-CM | POA: Diagnosis not present

## 2020-02-20 NOTE — Patient Instructions (Signed)
Please return in October for your annual complete physical; please come fasting.  We will be in touch regarding getting you set up for a sleep evaluation.   If you have any questions or concerns, please don't hesitate to send me a message via MyChart or call the office at [phone]. Thank you for visiting with Jasmine Bullock today! It's our pleasure caring for you.   Sleep Studies A sleep study (polysomnogram) is a series of tests done while you are sleeping. A sleep study records your brain waves, heart rate, breathing rate, oxygen level, and eye and leg movements. A sleep study helps your health care provider:  See how well you sleep.  Diagnose a sleep disorder.  Determine how severe your sleep disorder is.  Create a plan to treat your sleep disorder. Your health care provider may recommend a sleep study if you:  Feel sleepy on most days.  Snore loudly while sleeping.  Have unusual behaviors while you sleep, such as walking.  Have brief periods in which you stop breathing during sleep (sleepapnea).  Fall asleep suddenly during the day (narcolepsy).  Have trouble falling asleep or staying asleep (insomnia).  Feel like you need to move your legs when trying to fall asleep (restless legs syndrome).  Move your legs by flexing and extending them regularly while asleep (periodic limb movement disorder).  Act out your dreams while you sleep (sleep behavior disorder).  Feel like you cannot move when you first wake up (sleep paralysis). What tests are part of a sleep study? Most sleep studies record the following during sleep:  Brain activity.  Eye movements.  Heart rate and rhythm.  Breathing rate and rhythm.  Blood-oxygen level.  Blood pressure.  Chest and belly movement as you breathe.  Arm and leg movements.  Snoring or other noises.  Body position. Where are sleep studies done? Sleep studies are done at sleep centers. A sleep center may be inside a hospital, office,  or clinic. The room where you have the study may look like a hospital room or a hotel room. The health care providers doing the study may come in and out of the room during the study. Most of the time, they will be in another room monitoring your test as you sleep. How are sleep studies done? Most sleep studies are done during a normal period of time for a full night of sleep. You will arrive at the study center in the evening and go home in the morning. Before the test  Bring your pajamas and toothbrush with you to the sleep study.  Do not have caffeine on the day of your sleep study.  Do not drink alcohol on the day of your sleep study.  Your health care provider will let you know if you should stop taking any of your regular medicines before the test. During the test      Round, sticky patches with sensors attached to recording wires (electrodes) are placed on your scalp, face, chest, and limbs.  Wires from all the electrodes and sensors run from your bed to a computer. The wires can be taken off and put back on if you need to get out of bed to go to the bathroom.  A sensor is placed over your nose to measure airflow.  A finger clip is put on your finger or ear to measure your blood oxygen level (pulse oximetry).  A belt is placed around your belly and a belt is placed around your chest to  measure breathing movements.  If you have signs of the sleep disorder called sleep apnea during your test, you may get a treatment mask to wear for the second half of the night. ? The mask provides positive airway pressure (PAP) to help you breathe better during sleep. This may greatly improve your sleep apnea. ? You will then have all tests done again with the mask in place to see if your measurements and recordings change. After the test  A medical doctor who specializes in sleep will evaluate the results of your sleep study and share them with you and your primary health care  provider.  Based on your results, your medical history, and a physical exam, you may be diagnosed with a sleep disorder, such as: ? Sleep apnea. ? Restless legs syndrome. ? Sleep-related behavior disorder. ? Sleep-related movement disorders. ? Sleep-related seizure disorders.  Your health care team will help determine your treatment options based on your diagnosis. This may include: ? Improving your sleep habits (sleep hygiene). ? Wearing a continuous positive airway pressure (CPAP) or bi-level positive airway pressure (BPAP) mask. ? Wearing an oral device at night to improve breathing and reduce snoring. ? Taking medicines. Follow these instructions at home:  Take over-the-counter and prescription medicines only as told by your health care provider.  If you are instructed to use a CPAP or BPAP mask, make sure you use it nightly as directed.  Make any lifestyle changes that your health care provider recommends.  If you were given a device to open your airway while you sleep, use it only as told by your health care provider.  Do not use any tobacco products, such as cigarettes, chewing tobacco, and e-cigarettes. If you need help quitting, ask your health care provider.  Keep all follow-up visits as told by your health care provider. This is important. Summary  A sleep study (polysomnogram) is a series of tests done while you are sleeping. It shows how well you sleep.  Most sleep studies are done over one full night of sleep. You will arrive at the study center in the evening and go home in the morning.  If you have signs of the sleep disorder called sleep apnea during your test, you may get a treatment mask to wear for the second half of the night.  A medical doctor who specializes in sleep will evaluate the results of your sleep study and share them with your primary health care provider. This information is not intended to replace advice given to you by your health care provider.  Make sure you discuss any questions you have with your health care provider. Document Revised: 02/12/2019 Document Reviewed: 09/25/2017 Elsevier Patient Education  [place].

## 2020-02-20 NOTE — Progress Notes (Signed)
Subjective  CC:  Chief Complaint  Patient presents with  . Sleep study    Was told that after her surgery that she had a breathing tube put in. Her husband has witnessed her waking up in a panic gasping for air.     HPI: Jasmine Bullock is a 39 y.o. female who presents to the office today to address the problems listed above in the chief complaint.  39 yo with controlled anxiety had plantar fasciitis repair: during surgery had to be intubated. Was told she was snoring and needed a sleep study   She admits she will awaken feeling panicked or gasping. No witnessed apnea. Feels tired but Epworth scale score was 6. She is not obese. Denies insomnia. Since being told about her sxs, she has noted more episodes of waking up panicked. May be related to anxiety as well. No RLS. No h/o insomnia.   Epworth Sleepiness Scale 0= would never doze 1= slight chance of dozing 2= moderate chance of dozing 3= high chance of dozing  Sitting and reading: 1 Watching TV: 1 Sitting inactive in a public place (ex. Theater or meeting): 0 As a passenger in a car for an hour without a break: 1 Lying down to rest in the afternoon: 2 Sitting and talking to someone: 0 Sitting quietly after lunch (no alcohol): 1 In a car, while stopped in traffic: 0 Total: 6  Assessment  1. Snoring   2. GAD (generalized anxiety disorder)      Plan   snoring:  Due to her concerns, recs by Selina Cooley, will refer for sleep study consideration. If negative, and sxs persist, to f/u with me to discuss other causes. Patient understands and agrees with care plan.    Follow up: Return in about 4 months (around 06/21/2020) for complete physical.  Visit date not found  No orders of the defined types were placed in this encounter.  No orders of the defined types were placed in this encounter.     I reviewed the patients updated PMH, FH, and SocHx.    Patient Active Problem List   Diagnosis Date Noted  . GAD (generalized  anxiety disorder) 01/28/2020    Priority: High  . NSAID induced gastritis 01/28/2020    Priority: Medium  . Female infertility, secondary 06/19/2019    Priority: Low   Current Meds  Medication Sig  . citalopram (CELEXA) 20 MG tablet Take 1 tablet by mouth daily.    Allergies: Patient has No Known Allergies. Family History: Patient family history includes Alcohol abuse in her maternal grandfather; Diabetes in her paternal grandfather; Healthy in her sister and son; Hearing loss in her paternal grandfather; Heart disease in her paternal grandfather; Hyperlipidemia in her paternal grandmother; Hypertension in her mother, paternal grandfather, and paternal grandmother; Ovarian cancer in her maternal grandmother; Stroke in her paternal grandfather; Ulcers in her father. Social History:  Patient  reports that she has never smoked. She has never used smokeless tobacco. She reports current alcohol use of about 3.0 standard drinks of alcohol per week. She reports that she does not use drugs.  Review of Systems: Constitutional: Negative for fever malaise or anorexia Cardiovascular: negative for chest pain Respiratory: negative for SOB or persistent cough Gastrointestinal: negative for abdominal pain  Objective  Vitals: BP 118/70   Pulse 70   Temp 98.5 F (36.9 C) (Temporal)   Resp 18   Ht 5\' 7"  (1.702 m)   Wt 173 lb 6.4 oz (78.7 kg)  SpO2 97%   BMI 27.16 kg/m  General: no acute distress , A&Ox3 Cardiovascular:  RRR without murmur or gallop.  Respiratory:  Good breath sounds bilaterally, CTAB with normal respiratory effort Skin:  Warm, no rashes     Commons side effects, risks, benefits, and alternatives for medications and treatment plan prescribed today were discussed, and the patient expressed understanding of the given instructions. Patient is instructed to call or message via MyChart if he/she has any questions or concerns regarding our treatment plan. No barriers to  understanding were identified. We discussed Red Flag symptoms and signs in detail. Patient expressed understanding regarding what to do in case of urgent or emergency type symptoms.   Medication list was reconciled, printed and provided to the patient in AVS. Patient instructions and summary information was reviewed with the patient as documented in the AVS. This note was prepared with assistance of Dragon voice recognition software. Occasional wrong-word or sound-a-like substitutions may have occurred due to the inherent limitations of voice recognition software  This visit occurred during the SARS-CoV-2 public health emergency.  Safety protocols were in place, including screening questions prior to the visit, additional usage of staff PPE, and extensive cleaning of exam room while observing appropriate contact time as indicated for disinfecting solutions.

## 2020-02-24 ENCOUNTER — Other Ambulatory Visit: Payer: Self-pay | Admitting: Family Medicine

## 2020-02-24 ENCOUNTER — Telehealth: Payer: Self-pay | Admitting: Family Medicine

## 2020-02-24 ENCOUNTER — Other Ambulatory Visit: Payer: Self-pay

## 2020-02-24 DIAGNOSIS — R0683 Snoring: Secondary | ICD-10-CM

## 2020-02-24 NOTE — Telephone Encounter (Signed)
Referral placed.

## 2020-02-24 NOTE — Telephone Encounter (Signed)
[facility] called - the sleep study that was placed was an order and not a referral.  Please place a referral - the code is [id number].  Thank you.

## 2020-03-25 ENCOUNTER — Encounter: Payer: Self-pay | Admitting: Neurology

## 2020-03-25 ENCOUNTER — Ambulatory Visit: Payer: 59 | Admitting: Neurology

## 2020-03-25 ENCOUNTER — Other Ambulatory Visit: Payer: Self-pay

## 2020-03-25 VITALS — BP 106/70 | HR 71 | Ht 67.0 in | Wt 175.0 lb

## 2020-03-25 DIAGNOSIS — G4751 Confusional arousals: Secondary | ICD-10-CM | POA: Diagnosis not present

## 2020-03-25 DIAGNOSIS — R0681 Apnea, not elsewhere classified: Secondary | ICD-10-CM

## 2020-03-25 DIAGNOSIS — E663 Overweight: Secondary | ICD-10-CM

## 2020-03-25 DIAGNOSIS — G4750 Parasomnia, unspecified: Secondary | ICD-10-CM

## 2020-03-25 DIAGNOSIS — R0683 Snoring: Secondary | ICD-10-CM

## 2020-03-25 DIAGNOSIS — G478 Other sleep disorders: Secondary | ICD-10-CM

## 2020-03-25 DIAGNOSIS — F513 Sleepwalking [somnambulism]: Secondary | ICD-10-CM

## 2020-03-25 NOTE — Patient Instructions (Addendum)

## 2020-03-25 NOTE — Progress Notes (Signed)
Subjective:    Patient ID: Jasmine Bullock is a 39 y.o. female.  HPI     Huston Foley, MD, PhD [facility] [address], [address] [address] [place], [place] [address]  Dear Dr. Mardelle Matte,   I saw your patient, Jasmine Bullock, upon your kind request, in my sleep clinic today for initial consultation of her sleep disorder, in particular, concern for underlying obstructive sleep apnea.  The patient is unaccompanied today.  As you know, Jasmine Bullock is a 39 year old right-handed woman with an underlying medical history of reflux disease, depression, anxiety, asthma, allergies, plantar fasciitis, and overweight state, who reports snoring and occasional sleep disruption from waking up in a panic and confused.  She has a history of sleep talking as an adult, does not recall having any sleepwalking or sleep talking as a child.  She may have had some sleepwalking as an adult as she remembers 1 time she woke up in her son's bedroom standing and confused how she got there.  She does not have any frequent episodes of sleep talking as far she can tell.  Her husband has noted her waking up in a panic.  She does snore to some degree, does not wake up with a headache typically and does not have to get up to use the bathroom at night.  Reports that her father snores loudly but has not been formally diagnosed with sleep apnea.  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  For that reason she has to get up on Mondays and Tuesdays at 5 AM and otherwise she wakes up between 630 and 7.  She lives with her family which includes her husband and 71-year-old son as well as 1 dog in the household.  Dog sleeps in their bedroom but not on the bed.  She has a TV in the bedroom but does not have to have it on at night and if she watches it before falling asleep she turns it off.  She drinks caffeine in the form of coffee, 1 or 2 cups in the mornings, occasional tea in the day, she had some smoking when she was in  college, drinks alcohol in the form of wine, typically 1 glass in the evening. I reviewed your office note from 02/20/2020.  She reported during that appointment that she was encouraged to seek evaluation for sleep apnea.  This was a concern that came out when she had surgery for plantar fasciitis.  Her Epworth sleepiness score is 7 out of 24, fatigue severity score is 38 out of 63.   Her Past Medical History Is Significant For: Past Medical History:  Diagnosis Date  . Allergy   . Anxiety   . Asthma    childhood  . Depression   . 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 GERD (gastroesophageal reflux disease) 01/28/2020  . Plantar fasciitis    left foot    Her Past Surgical History Is Significant For: Past Surgical History:  Procedure Laterality Date  . L wrist surgery    . PLANTAR FASCIA RELEASE Left 01/27/2020  . WISDOM TOOTH EXTRACTION      Her Family History Is Significant For: Family History  Problem Relation Age of Onset  . Hypertension Mother   . Ulcers Father   . Healthy Sister   . Ovarian cancer Maternal Grandmother   . Alcohol abuse Maternal Grandfather   . Hypertension Paternal Grandmother   . Hyperlipidemia Paternal Grandmother   . Diabetes Paternal Grandfather   .  Heart disease Paternal Grandfather   . Hearing loss Paternal Grandfather   . Hypertension Paternal Grandfather   . Stroke Paternal Grandfather   . Healthy Son   . Colon cancer Neg Hx   . Rectal cancer Neg Hx   . Esophageal cancer Neg Hx   . Breast cancer Neg Hx     Her Social History Is Significant For: Social History   Socioeconomic History  . Marital status: Married    Spouse name: Not on file  . Number of children: 1  . Years of education: Not on file  . Highest education level: Not on file  Occupational History  . Occupation: dental hygentist   Tobacco Use  . Smoking status: Never Smoker  . Smokeless tobacco: Never Used  Vaping Use  . Vaping Use: Never used  Substance and Sexual  Activity  . Alcohol use: Yes    Alcohol/week: 3.0 standard drinks    Types: 3 Standard drinks or equivalent per week    Comment: a glass of wine nightly  . Drug use: No  . Sexual activity: Yes    Partners: Male    Birth control/protection: None    Comment: infertility  Other Topics Concern  . 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      Tobacco: none      Alcohol: weekends; one glass of wine per night.      Exercise:  3-4 days per week; running 3-4 miles      Seatbelt: 100%; no texting   Social Determinants of Health   Financial Resource Strain:   . Difficulty of Paying Living Expenses:   Food Insecurity:   . Worried About Programme researcher, broadcasting/film/videounning Out of Food in the Last Year:   . Baristaan Out of Food in the Last Year:   Transportation Needs:   . Freight forwarderLack of Transportation (Medical):   Marland Kitchen. Lack of Transportation (Non-Medical):   Physical Activity:   . Days of Exercise per Week:   . Minutes of Exercise per Session:   Stress:   . Feeling of Stress :   Social Connections:   . Frequency of Communication with Friends and Family:   . Frequency of Social Gatherings with Friends and Family:   . Attends Religious Services:   . Active Member of Clubs or Organizations:   . Attends BankerClub or Organization Meetings:   Marland Kitchen. Marital Status:     Her Allergies Are:  No Known Allergies:   Her Current Medications Are:  Outpatient Encounter Medications as of 03/25/2020  Medication Sig  . citalopram (CELEXA) 20 MG tablet Take 1 tablet by mouth daily.   No facility-administered encounter medications on file as of 03/25/2020.  :  Review of Systems:  Out of a complete 14 point review of systems, all are reviewed and negative with the exception of these symptoms as listed below: Review of Systems  Neurological:       Here for sleep consult. No prior  sleep study. Pt reports snoring is present.  Pt had plantar fasciatus surgery at the beginning of the year and ended up having to have a breathing tube due to snoring. Pt was encouraged by the anesthesiologist team to have osa ruled out.    Epworth Sleepiness Scale 0= would never doze  1= slight chance of dozing 2= moderate chance of dozing 3= high chance of dozing  Sitting and reading:2 Watching TV:1 Sitting inactive in a public place (ex. Theater or meeting):0 As a passenger in a car for an hour without a break:1 Lying down to rest in the afternoon:2 Sitting and talking to someone:0 Sitting quietly after lunch (no alcohol):1 In a car, while stopped in traffic:0 Total:7     Objective:  Neurological Exam  Physical Exam Physical Examination:   Vitals:   03/25/20 0745  BP: 106/70  Pulse: 71  SpO2: 99%   General Examination: The patient is a very pleasant 39 y.o. female in no acute distress. She appears well-developed and well-nourished and well groomed.   HEENT: Normocephalic, atraumatic, pupils are equal, round and reactive to light, extraocular tracking is good without limitation to gaze excursion or nystagmus noted. Hearing is grossly intact. Face is symmetric with normal facial animation. Speech is clear with no dysarthria noted. There is no hypophonia. There is no lip, neck/head, jaw or voice tremor. Neck is supple with full range of passive and active motion. There are no carotid bruits on auscultation. Oropharynx exam reveals: no signif. mouth dryness, good dental hygiene and benign airway, with tonsils of 1+. Mallampati is class I. Tongue protrudes centrally and palate elevates symmetrically. Neck size is 13.5 inches. She has a minimal overbite.   Chest: Clear to auscultation without wheezing, rhonchi or crackles noted.  Heart: S1+S2+0, regular and normal without murmurs, rubs or gallops noted.   Abdomen: Soft, non-tender and non-distended with normal bowel sounds  appreciated on auscultation.  Extremities: There is no pitting edema in the distal lower extremities bilaterally.   Skin: Warm and dry without trophic changes noted.   Musculoskeletal: exam reveals no obvious joint deformities, tenderness or joint swelling or erythema.  Well-healing scars left distal leg and arch of the foot on the left side.  Neurologically:  Mental status: The patient is awake, alert and oriented in all 4 spheres. Her immediate and remote memory, attention, language skills and fund of knowledge are appropriate. There is no evidence of aphasia, agnosia, apraxia or anomia. Speech is clear with normal prosody and enunciation. Thought process is linear. Mood is normal and affect is normal.  Cranial nerves II - XII are as described above under HEENT exam.  Motor exam: Normal bulk, strength and tone is noted. There is no tremor, Romberg is negative. Fine motor skills and coordination: grossly intact.  Cerebellar testing: No dysmetria or intention tremor. There is no truncal or gait ataxia.  Sensory exam: intact to light touch in the upper and lower extremities.  Gait, station and balance: She stands easily. No veering to one side is noted. No leaning to one side is noted. Posture is age-appropriate and stance is narrow based. Gait shows normal stride length and normal pace. No problems turning are noted. Tandem walk is unremarkable. No significant limp noted.               Assessment and Plan:   In summary, Jasmine Bullock is a very pleasant 39 y.o.-year old female with an underlying medical history of reflux disease, depression, anxiety, asthma, allergies, plantar fasciitis, and overweight state, whose history and physical exam are concerning for obstructive sleep apnea (OSA), she had a recent surgery to her left foot about a month ago and was noted to have apneic pauses and required brief intubation as I understand.  She has woken up with a sense of gasping  and in the panic.  She may  have underlying parasomnias such as night terrors, she also has been noted to sleep talk.   I had a long chat with the patient about my findings and the diagnosis of OSA, its prognosis and treatment options. We talked about medical treatments, surgical interventions and non-pharmacological approaches. I explained in particular the risks and ramifications of untreated moderate to severe OSA, especially with respect to developing cardiovascular disease down the Road, including congestive heart failure, difficult to treat hypertension, cardiac arrhythmias, or stroke. Even type 2 diabetes has, in part, been linked to untreated OSA. Symptoms of untreated OSA include daytime sleepiness, memory problems, mood irritability and mood disorder such as depression and anxiety, lack of energy, as well as recurrent headaches, especially morning headaches. We talked about trying to maintain a healthy lifestyle in general, as well as the importance of weight control. We also talked about the importance of good sleep hygiene. I recommended the following at this time: sleep study.  I explained the sleep test procedure to the patient and also outlined possible surgical and non-surgical treatment options of OSA, including the use of a custom-made dental device (which would require a referral to a specialist dentist or oral surgeon), upper airway surgical options, such as traditional UPPP or a novel less invasive surgical option in the form of Inspire hypoglossal nerve stimulation (which would involve a referral to an ENT surgeon). I also explained the CPAP treatment option to the patient, who indicated that she would be willing to try CPAP if the need arises. I explained the importance of being compliant with PAP treatment, not only for insurance purposes but primarily to improve Her symptoms, and for the patient's long term health benefit, including to reduce Her cardiovascular risks. I answered all her questions today and the  patient was in agreement. I plan to see her back after the sleep study is completed and encouraged her to call with any interim questions, concerns, problems or updates.   Thank you very much for allowing me to participate in the care of this nice patient. If I can be of any further assistance to you please do not hesitate to call me at [phone].  Sincerely,   Huston Foley, MD, PhD

## 2020-04-06 ENCOUNTER — Telehealth: Payer: Self-pay

## 2020-04-06 NOTE — Telephone Encounter (Signed)
LVM for pt to call me back to schedule sleep study  

## 2020-05-04 ENCOUNTER — Telehealth: Payer: Self-pay

## 2020-05-04 NOTE — Telephone Encounter (Signed)
LVM for pt to call me back to schedule sleep study  

## 2020-05-13 ENCOUNTER — Telehealth: Payer: Self-pay

## 2020-05-13 NOTE — Telephone Encounter (Signed)
We have attempted to call the patient three times to schedule sleep study.  Patient has been unavailable at the phone numbers we have on file and has not returned our calls.  If patient calls back we will schedule them for their sleep study.  

## 2020-07-08 ENCOUNTER — Other Ambulatory Visit: Payer: Self-pay

## 2020-07-08 ENCOUNTER — Telehealth (INDEPENDENT_AMBULATORY_CARE_PROVIDER_SITE_OTHER): Payer: 59 | Admitting: Family Medicine

## 2020-07-08 DIAGNOSIS — L989 Disorder of the skin and subcutaneous tissue, unspecified: Secondary | ICD-10-CM

## 2020-07-08 DIAGNOSIS — L0390 Cellulitis, unspecified: Secondary | ICD-10-CM

## 2020-07-08 MED ORDER — DOXYCYCLINE HYCLATE 100 MG PO TABS: 100.0000 mg | ORAL_TABLET | Freq: Two times a day (BID) | ORAL | 0 refills | Status: DC

## 2020-07-08 NOTE — Progress Notes (Signed)
Virtual Visit via Video Note  I connected with Jasmine Bullock  on 07/08/20 at 12:40 PM EDT by a video enabled telemedicine application and verified that I am speaking with the correct person using two identifiers.  Location patient: home Location provider:work or home office Persons participating in the virtual visit: patient, provider  I discussed the limitations of evaluation and management by telemedicine and the availability of in person appointments. The patient expressed understanding and agreed to proceed.   HPI:  Acute telemedicine visit for a skin lesion: -Onset: 2 days ago -Symptoms include: looks like a mosquito bite and was itchy initially, she put some alcohol on it and now it seems more itchy and more red, she has been manipulating it -Denies: pain, purulence or drainage, fevers, flu like symptoms or any other lesions or symptoms, tick bites -utd on tetanus -she is worried about infection because redness is rapidly spreading today and feels swollen -denies pregnancy, lactation or liver disease -reports is utd on tetanus vaccine   ROS: See pertinent positives and negatives per HPI.  Past Medical History:  Diagnosis Date  . Allergy   . Anxiety   . Asthma    childhood  . Depression   . 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 GERD (gastroesophageal reflux disease) 01/28/2020  . Plantar fasciitis    left foot    Past Surgical History:  Procedure Laterality Date  . L wrist surgery    . PLANTAR FASCIA RELEASE Left 01/27/2020  . WISDOM TOOTH EXTRACTION       Current Outpatient Medications:  .  citalopram (CELEXA) 20 MG tablet, Take 1 tablet by mouth daily., Disp: , Rfl:  .  doxycycline (VIBRA-TABS) 100 MG tablet, Take 1 tablet (100 mg total) by mouth 2 (two) times daily., Disp: 10 tablet, Rfl: 0  EXAM:  VITALS per patient if applicable:  GENERAL: alert, oriented, appears well and in no acute distress  HEENT: atraumatic, conjunttiva clear, no obvious  abnormalities on inspection of external nose and ears  NECK: normal movements of the head and neck  LUNGS: on inspection no signs of respiratory distress, breathing rate appears normal, no obvious gross SOB, gasping or wheezing  CV: no obvious cyanosis  MS: moves all visible extremities without noticeable abnormality  SKIN:   PSYCH/NEURO: pleasant and cooperative, no obvious depression or anxiety, speech and thought processing grossly intact  ASSESSMENT AND PLAN:  Discussed the following assessment and plan:  Skin lesion  Cellulitis, unspecified cellulitis site  -we discussed possible serious and likely etiologies, options for evaluation and workup, limitations of telemedicine visit vs in person visit, treatment, treatment risks and precautions. Pt prefers to treat via telemedicine empirically rather than in person at this moment.  She has a physical with her primary care doctor tomorrow.  However, she did not want to wait for evaluation of this, as she is worried about infection.  Query local reaction versus cellulitis secondary to an insect bite versus other.  Opted for doxycycline 100 mg twice daily for 5 days, topical hydrocortisone cream and an antihistamine such as Zyrtec once daily for 1 week.  She has follow-up tomorrow with PCP.  Advised to seek prompt in person care if worsening, new symptoms arise, or if is not improving with treatment. Discussed options for inperson care if PCP office not available. Did let this patient know that I only do telemedicine on Tuesdays and Thursdays for [facility]. Advised to schedule follow up visit with PCP or [facility] if any further questions or  concerns to avoid delays in care.   I discussed the assessment and treatment plan with the patient. The patient was provided an opportunity to ask questions and all were answered. The patient agreed with the plan and demonstrated an understanding of the instructions.     Terressa Koyanagi, DO

## 2020-07-08 NOTE — Patient Instructions (Signed)
-  I sent the medication(s) we discussed to your pharmacy: Meds ordered this encounter  Medications  . doxycycline (VIBRA-TABS) 100 MG tablet    Sig: Take 1 tablet (100 mg total) by mouth 2 (two) times daily.    Dispense:  10 tablet    Refill:  0     I hope you are feeling better soon!  Seek in person care promptly if your symptoms worsen, new concerns arise or you are not improving with treatment.  It was nice to meet you today. I help [facility] out with telemedicine visits on Tuesdays and Thursdays and am available for visits on those days. If you have any concerns or questions following this visit please schedule a follow up visit with your Primary Care doctor or seek care at a local urgent care clinic to avoid delays in care.

## 2020-07-09 ENCOUNTER — Encounter: Payer: Self-pay | Admitting: Family Medicine

## 2020-07-09 ENCOUNTER — Other Ambulatory Visit: Payer: Self-pay

## 2020-07-09 ENCOUNTER — Ambulatory Visit (INDEPENDENT_AMBULATORY_CARE_PROVIDER_SITE_OTHER): Payer: 59 | Admitting: Family Medicine

## 2020-07-09 VITALS — BP 110/80 | HR 69 | Temp 98.3°F | Ht 67.0 in | Wt 173.0 lb

## 2020-07-09 DIAGNOSIS — S50861A Insect bite (nonvenomous) of right forearm, initial encounter: Secondary | ICD-10-CM | POA: Diagnosis not present

## 2020-07-09 DIAGNOSIS — F411 Generalized anxiety disorder: Secondary | ICD-10-CM | POA: Diagnosis not present

## 2020-07-09 DIAGNOSIS — Z23 Encounter for immunization: Secondary | ICD-10-CM

## 2020-07-09 DIAGNOSIS — Z0000 Encounter for general adult medical examination without abnormal findings: Secondary | ICD-10-CM

## 2020-07-09 DIAGNOSIS — W57XXXA Bitten or stung by nonvenomous insect and other nonvenomous arthropods, initial encounter: Secondary | ICD-10-CM

## 2020-07-09 MED ORDER — CITALOPRAM HYDROBROMIDE 20 MG PO TABS
20.0000 mg | ORAL_TABLET | Freq: Every day | ORAL | 3 refills | Status: DC
Start: 2020-07-09 — End: 2021-07-26

## 2020-07-09 NOTE — Progress Notes (Signed)
Subjective  Chief Complaint  Patient presents with  . Annual Exam    fasting  . Insect Bite    saw Dr. Selena Thrush yesterday, told to f/u with PCP  . stable weight    unable to lose weight, believe this to be thyroid issue  . Health Maintenance    requesting flu shot today, is currently on antibiotic for insect bite    HPI: Jasmine Bullock is a 39 y.o. female who presents to [facility] [place] today for a Female Wellness Visit. She also has the concerns and/or needs as listed above in the chief complaint. These will be addressed in addition to the Health Maintenance Visit.   Wellness Visit: annual visit with health maintenance review and exam without Pap   HM: due flu vaccine. Healthy lifestyle; exercising and eating better. Down a few pounds. Energy is good.  Chronic disease f/u and/or acute problem visit: (deemed necessary to be done in addition to the wellness visit):  Insect bite: reviewed note from yesterday. Started on doxy and claritin. Much better today. doi 10/25 possibly. Was camping over the weekend. + itchy. No pain. No fevers. No streaking  Ros: hair thinning.   GAD well controlled on celexa.   Assessment  1. Annual physical exam   2. GAD (generalized anxiety disorder)   3. Insect bite of right forearm, initial encounter      Plan  Female Wellness Visit:  Age appropriate Health Maintenance and Prevention measures were discussed with patient. Included topics are cancer screening recommendations, ways to keep healthy (see AVS) including dietary and exercise recommendations, regular eye and dental care, use of seat belts, and avoidance of moderate alcohol use and tobacco use.   BMI: discussed patient's BMI and encouraged positive lifestyle modifications to help get to or maintain a target BMI.  HM needs and immunizations were addressed and ordered. See below for orders. See HM and immunization section for updates. Flu vaccine  Routine labs and  screening tests ordered including cmp, cbc and lipids where appropriate.  Discussed recommendations regarding Vit D and calcium supplementation (see AVS)  Chronic disease management visit and/or acute problem visit:  Weight management. Counseling and reassurance given. Goals discussed.   Familial hair loss.   Gad is controlled.    Follow up: 12 mo cpe  Orders Placed This Encounter  Procedures  . COMPLETE METABOLIC PANEL WITH GFR  . CBC with Differential/Platelet  . Lipid panel  . TSH   No orders of the defined types were placed in this encounter.     Lifestyle: Body mass index is 27.1 kg/m. Wt Readings from Last 3 Encounters:  07/09/20 173 lb (78.5 kg)  03/25/20 175 lb (79.4 kg)  02/20/20 173 lb 6.4 oz (78.7 kg)    Patient Active Problem List   Diagnosis Date Noted  . GAD (generalized anxiety disorder) 01/28/2020    Priority: High    Diagnosed initially in early 20s; has been well controlled on celexa and prn xanax. Has had panic attacks. Mom and MGM have same.    . NSAID induced gastritis 01/28/2020    Priority: Medium    2020 due to mobic use for PF;resolved with one course of PPI, Dr. Orvan Falconer   . Female infertility, secondary 06/19/2019    Priority: Low   Health Maintenance  Topic Date Due  . Hepatitis C Screening  Never done  . INFLUENZA VACCINE  04/11/2020  . PAP SMEAR-Modifier  05/20/2022  . TETANUS/TDAP  09/11/2022  . COVID-19 Vaccine  Completed  . HIV Screening  Completed   Immunization History  Administered Date(s) Administered  . Influenza,inj,Quad PF,6+ Mos 06/12/2014, 05/16/2016, 07/06/2017, 09/19/2018, 06/19/2019  . PFIZER SARS-COV-2 Vaccination 09/24/2019, 10/16/2019  . PPD Test 10/27/2016  . Tdap 05/26/2009, 09/11/2012   We updated and reviewed the patient's past history in detail and it is documented below. Allergies: Patient has No Known Allergies. Past Medical History Patient  has a past medical history of Allergy, Anxiety, Asthma,  Depression, Female infertility, secondary (06/19/2019), GERD (gastroesophageal reflux disease) (01/28/2020), and Plantar fasciitis. Past Surgical History Patient  has a past surgical history that includes L wrist surgery; Wisdom tooth extraction; and Plantar fascia release (Left, 01/27/2020). Family History: Patient family history includes Alcohol abuse in her maternal grandfather; Diabetes in her paternal grandfather; Healthy in her sister and son; Hearing loss in her paternal grandfather; Heart disease in her paternal grandfather; Hyperlipidemia in her paternal grandmother; Hypertension in her mother, paternal grandfather, and paternal grandmother; Ovarian cancer in her maternal grandmother; Stroke in her paternal grandfather; Ulcers in her father. Social History:  Patient  reports that she has never smoked. She has never used smokeless tobacco. She reports current alcohol use of about 3.0 standard drinks of alcohol per week. She reports that she does not use drugs.  Review of Systems: Constitutional: negative for fever or malaise Ophthalmic: negative for photophobia, double vision or loss of vision Cardiovascular: negative for chest pain, dyspnea on exertion, or new LE swelling Respiratory: negative for SOB or persistent cough Gastrointestinal: negative for abdominal pain, change in bowel habits or melena Genitourinary: negative for dysuria or gross hematuria, no abnormal uterine bleeding or disharge Musculoskeletal: negative for new gait disturbance or muscular weakness Integumentary: negative for new or persistent rashes, no breast lumps Neurological: negative for TIA or stroke symptoms Psychiatric: negative for SI or delusions Allergic/Immunologic: negative for hives  Patient Care Team    Relationship Specialty Notifications Start End  Willow Ora, MD PCP - General Family Medicine  01/28/20   Tressia Danas, MD Consulting Physician Gastroenterology  01/28/20   Toni Arthurs, MD  Consulting Physician Orthopedic Surgery  01/28/20   Noland Fordyce, MD Consulting Physician Obstetrics and Gynecology  01/28/20     Objective  Vitals: BP 110/80   Pulse 69   Temp 98.3 F (36.8 C) (Temporal)   Ht 5\' 7"  (1.702 m)   Wt 173 lb (78.5 kg)   SpO2 99%   BMI 27.10 kg/m  General:  Well developed, well nourished, no acute distress  Psych:  Alert and orientedx3,normal mood and affect HEENT:  Normocephalic, atraumatic, non-icteric sclera,  supple neck without adenopathy, mass or thyromegaly Cardiovascular:  Normal S1, S2, RRR without gallop, rub or murmur Respiratory:  Good breath sounds bilaterally, CTAB with normal respiratory effort Gastrointestinal: normal bowel sounds, soft, non-tender, no noted masses. No HSM MSK: no deformities, contusions. Joints are without erythema or swelling.  Skin:  Warm, no rashes or suspicious lesions noted Neurologic:    Mental status is normal. CN 2-11 are normal. Gross motor and sensory exams are normal. Normal gait. No tremor Breast Exam: No mass, skin retraction or nipple discharge is appreciated in either breast. No axillary adenopathy. Fibrocystic changes are not noted    Commons side effects, risks, benefits, and alternatives for medications and treatment plan prescribed today were discussed, and the patient expressed understanding of the given instructions. Patient is instructed to call or message via MyChart if he/she has any questions  or concerns regarding our treatment plan. No barriers to understanding were identified. We discussed Red Flag symptoms and signs in detail. Patient expressed understanding regarding what to do in case of urgent or emergency type symptoms.   Medication list was reconciled, printed and provided to the patient in AVS. Patient instructions and summary information was reviewed with the patient as documented in the AVS. This note was prepared with assistance of Dragon voice recognition software. Occasional wrong-word  or sound-a-like substitutions may have occurred due to the inherent limitations of voice recognition software  This visit occurred during the SARS-CoV-2 public health emergency.  Safety protocols were in place, including screening questions prior to the visit, additional usage of staff PPE, and extensive cleaning of exam room while observing appropriate contact time as indicated for disinfecting solutions.

## 2020-07-09 NOTE — Patient Instructions (Signed)
Please return in 12 months for your annual complete physical; please come fasting.  I will release your lab results to you on your MyChart account with further instructions. Please reply with any questions.  Today you were given your flu vaccination.    If you have any questions or concerns, please don't hesitate to send me a message via MyChart or call the office at [phone]. Thank you for visiting with [place] today! It's our pleasure caring for you.   Calcium Intake Recommendations You can take Caltrate Plus twice a day or get it through your diet or other OTC supplements (Viactiv, OsCal etc)  Calcium is a mineral that affects many functions in the body, including:  Blood clotting.  Blood vessel function.  Nerve impulse conduction.  Hormone secretion.  Muscle contraction.  Bone and teeth functions.  Most of your body's calcium supply is stored in your bones and teeth. When your calcium stores are low, you may be at risk for low bone mass, bone loss, and bone fractures. Consuming enough calcium helps to grow healthy bones and teeth and to prevent breakdown over time. It is very important that you get enough calcium if you are:  A child undergoing rapid growth.  An adolescent girl.  A pre- or post-menopausal woman.  A woman whose menstrual cycle has stopped due to anorexia nervosa or regular intense exercise.  An individual with lactose intolerance or a milk allergy.  A vegetarian.  What is my plan? Try to consume the recommended amount of calcium daily based on your age. Depending on your overall health, your health care provider may recommend increased calcium intake.General daily calcium intake recommendations by age are:  Birth to 6 months: 200 mg.  Infants 7 to 12 months: 260 mg.  Children 1 to 3 years: 700 mg.  Children 4 to 8 years: 1,000 mg.  Children 9 to 13 years: 1,300 mg.  Teens 14 to 18 years: 1,300 mg.  Adults 19 to 50 years: 1,000 mg.  Adult  women 51 to 70 years: 1,200 mg.  Adult men 51 to 70 years: 1,000 mg.  Adults 71 years and older: 1,200 mg.  Pregnant and breastfeeding teens: 1,300 mg.  Pregnant and breastfeeding adults: 1,000 mg.  What do I need to know about calcium intake?  In order for the body to absorb calcium, it needs vitamin D. You can get vitamin D through (we recommend getting [phone] units of Vitamin D daily) ? Direct exposure of the skin to sunlight. ? Foods, such as egg yolks, liver, saltwater fish, and fortified milk. ? Supplements.  Consuming too much calcium may cause: ? Constipation. ? Decreased absorption of iron and zinc. ? Kidney stones.  Calcium supplements may interact with certain medicines. Check with your health care provider before starting any calcium supplements.  Try to get most of your calcium from food. What foods can I eat? Grains  Fortified oatmeal. Fortified ready-to-eat cereals. Fortified frozen waffles. Vegetables Turnip greens. Broccoli. Fruits Fortified orange juice. Meats and Other Protein Sources Canned sardines with bones. Canned salmon with bones. Soy beans. Tofu. Baked beans. Almonds. [place] nuts. Sunflower seeds. Dairy Milk. Yogurt. Cheese. Cottage cheese. Beverages Fortified soy milk. Fortified rice milk. Sweets/Desserts Pudding. Ice Cream. Milkshakes. Blackstrap molasses. The items listed above may not be a complete list of recommended foods or beverages. Contact your dietitian for more options. What foods can affect my calcium intake? It may be more difficult for your body to use calcium or calcium may  leave your body more quickly if you consume large amounts of:  Sodium.  Protein.  Caffeine.  Alcohol.  This information is not intended to replace advice given to you by your health care provider. Make sure you discuss any questions you have with your health care provider. Document Released: 04/11/2004 Document Revised: 03/17/2016 Document Reviewed:  02/03/2014 Elsevier Interactive Patient Education  2018 [employer].  Please do these things to maintain good health!   Exercise at least 30-45 minutes a day,  4-5 days a week.   Eat a low-fat diet with lots of fruits and vegetables, up to 7-9 servings per day.  Drink plenty of water daily. Try to drink 8 8oz glasses per day.  Seatbelts can save your life. Always wear your seatbelt.  Place Smoke Detectors on every level of your home and check batteries every year.  Schedule an appointment with an eye doctor for an eye exam every 1-2 years  Safe sex - use condoms to protect yourself from STDs if you could be exposed to these types of infections. Use birth control if you do not want to become pregnant and are sexually active.  Avoid heavy alcohol use. If you drink, keep it to less than 2 drinks/day and not every day.  Health Care Power of Attorney.  Choose someone you trust that could speak for you if you became unable to speak for yourself.  Depression is common in our stressful world.If you're feeling down or losing interest in things you normally enjoy, please come in for a visit.  If anyone is threatening or hurting you, please get help. Physical or Emotional Violence is never OK.

## 2020-07-10 LAB — CBC WITH DIFFERENTIAL/PLATELET
Absolute Monocytes: 616 cells/uL (ref 200–950)
Basophils Absolute: 28 cells/uL (ref 0–200)
Basophils Relative: 0.3 %
Eosinophils Absolute: 64 cells/uL (ref 15–500)
Eosinophils Relative: 0.7 %
HCT: 40.8 % (ref 35.0–45.0)
Hemoglobin: 13.9 g/dL (ref 11.7–15.5)
Lymphs Abs: 1996 cells/uL (ref 850–3900)
MCH: 32.6 pg (ref 27.0–33.0)
MCHC: 34.1 g/dL (ref 32.0–36.0)
MCV: 95.6 fL (ref 80.0–100.0)
MPV: 12.1 fL (ref 7.5–12.5)
Monocytes Relative: 6.7 %
Neutro Abs: 6495 cells/uL (ref 1500–7800)
Neutrophils Relative %: 70.6 %
Platelets: 254 10*3/uL (ref 140–400)
RBC: 4.27 10*6/uL (ref 3.80–5.10)
RDW: 11.5 % (ref 11.0–15.0)
Total Lymphocyte: 21.7 %
WBC: 9.2 10*3/uL (ref 3.8–10.8)

## 2020-07-10 LAB — LIPID PANEL
Cholesterol: 174 mg/dL (ref <200)
HDL: 65 mg/dL (ref >=50)
LDL Cholesterol (Calc): 95 mg/dL (calc)
Non-HDL Cholesterol (Calc): 109 mg/dL (calc) (ref <130)
Total CHOL/HDL Ratio: 2.7 (calc) (ref <5.0)
Triglycerides: 58 mg/dL (ref <150)

## 2020-07-10 LAB — COMPLETE METABOLIC PANEL WITH GFR
AG Ratio: 2 (calc) (ref 1.0–2.5)
ALT: 8 U/L (ref 6–29)
AST: 13 U/L (ref 10–30)
Albumin: 4.4 g/dL (ref 3.6–5.1)
Alkaline phosphatase (APISO): 39 U/L (ref 31–125)
BUN: 11 mg/dL (ref 7–25)
CO2: 24 mmol/L (ref 20–32)
Calcium: 9.1 mg/dL (ref 8.6–10.2)
Chloride: 104 mmol/L (ref 98–110)
Creat: 0.66 mg/dL (ref 0.50–1.10)
GFR, Est African American: 129 mL/min/{1.73_m2} (ref >=60)
GFR, Est Non African American: 111 mL/min/{1.73_m2} (ref >=60)
Globulin: 2.2 g/dL (calc) (ref 1.9–3.7)
Glucose, Bld: 82 mg/dL (ref 65–99)
Potassium: 4.3 mmol/L (ref 3.5–5.3)
Sodium: 138 mmol/L (ref 135–146)
Total Bilirubin: 0.7 mg/dL (ref 0.2–1.2)
Total Protein: 6.6 g/dL (ref 6.1–8.1)

## 2020-07-10 LAB — TSH: TSH: 1.16 mIU/L

## 2020-12-13 ENCOUNTER — Other Ambulatory Visit: Payer: Self-pay | Admitting: Gastroenterology

## 2020-12-13 DIAGNOSIS — R1013 Epigastric pain: Secondary | ICD-10-CM

## 2020-12-13 DIAGNOSIS — Z791 Long term (current) use of non-steroidal anti-inflammatories (NSAID): Secondary | ICD-10-CM

## 2020-12-30 DIAGNOSIS — F32A Depression, unspecified: Secondary | ICD-10-CM

## 2020-12-30 HISTORY — DX: Depression, unspecified: F32.A

## 2021-07-26 ENCOUNTER — Other Ambulatory Visit: Payer: Self-pay | Admitting: Family Medicine

## 2021-08-15 ENCOUNTER — Other Ambulatory Visit: Payer: Self-pay

## 2021-08-15 ENCOUNTER — Encounter: Payer: Self-pay | Admitting: Family Medicine

## 2021-08-15 ENCOUNTER — Ambulatory Visit (INDEPENDENT_AMBULATORY_CARE_PROVIDER_SITE_OTHER): Payer: 59 | Admitting: Family Medicine

## 2021-08-15 VITALS — BP 105/67 | HR 67 | Temp 97.7°F | Ht 67.0 in | Wt 177.6 lb

## 2021-08-15 DIAGNOSIS — F411 Generalized anxiety disorder: Secondary | ICD-10-CM

## 2021-08-15 DIAGNOSIS — Z0000 Encounter for general adult medical examination without abnormal findings: Secondary | ICD-10-CM | POA: Diagnosis not present

## 2021-08-15 DIAGNOSIS — N979 Female infertility, unspecified: Secondary | ICD-10-CM

## 2021-08-15 DIAGNOSIS — Z3490 Encounter for supervision of normal pregnancy, unspecified, unspecified trimester: Secondary | ICD-10-CM

## 2021-08-15 LAB — LIPID PANEL
Cholesterol: 163 mg/dL (ref 0–200)
HDL: 73.2 mg/dL (ref >39.00)
LDL Cholesterol: 80 mg/dL (ref 0–99)
NonHDL: 89.54
Total CHOL/HDL Ratio: 2
Triglycerides: 48 mg/dL (ref 0.0–149.0)
VLDL: 9.6 mg/dL (ref 0.0–40.0)

## 2021-08-15 LAB — COMPREHENSIVE METABOLIC PANEL
ALT: 9 U/L (ref 0–35)
AST: 13 U/L (ref 0–37)
Albumin: 3.9 g/dL (ref 3.5–5.2)
Alkaline Phosphatase: 32 U/L — ABNORMAL LOW (ref 39–117)
BUN: 9 mg/dL (ref 6–23)
CO2: 26 mEq/L (ref 19–32)
Calcium: 8.7 mg/dL (ref 8.4–10.5)
Chloride: 105 mEq/L (ref 96–112)
Creatinine, Ser: 0.64 mg/dL (ref 0.40–1.20)
GFR: 110.5 mL/min (ref >60.00)
Glucose, Bld: 88 mg/dL (ref 70–99)
Potassium: 4.2 mEq/L (ref 3.5–5.1)
Sodium: 137 mEq/L (ref 135–145)
Total Bilirubin: 0.5 mg/dL (ref 0.2–1.2)
Total Protein: 6.4 g/dL (ref 6.0–8.3)

## 2021-08-15 LAB — CBC WITH DIFFERENTIAL/PLATELET
Basophils Absolute: 0 10*3/uL (ref 0.0–0.1)
Basophils Relative: 0.5 % (ref 0.0–3.0)
Eosinophils Absolute: 0.1 10*3/uL (ref 0.0–0.7)
Eosinophils Relative: 1.7 % (ref 0.0–5.0)
HCT: 39 % (ref 36.0–46.0)
Hemoglobin: 13.4 g/dL (ref 12.0–15.0)
Lymphocytes Relative: 24.1 % (ref 12.0–46.0)
Lymphs Abs: 1.9 10*3/uL (ref 0.7–4.0)
MCHC: 34.3 g/dL (ref 30.0–36.0)
MCV: 93.4 fl (ref 78.0–100.0)
Monocytes Absolute: 0.5 10*3/uL (ref 0.1–1.0)
Monocytes Relative: 6 % (ref 3.0–12.0)
Neutro Abs: 5.2 10*3/uL (ref 1.4–7.7)
Neutrophils Relative %: 67.7 % (ref 43.0–77.0)
Platelets: 259 10*3/uL (ref 150.0–400.0)
RBC: 4.17 Mil/uL (ref 3.87–5.11)
RDW: 12.3 % (ref 11.5–15.5)
WBC: 7.7 10*3/uL (ref 4.0–10.5)

## 2021-08-15 LAB — TSH: TSH: 1.43 u[IU]/mL (ref 0.35–5.50)

## 2021-08-15 NOTE — Patient Instructions (Signed)
Please return in 12 months for your annual complete physical; please come fasting.   I will release your lab results to you on your MyChart account with further instructions. Please reply with any questions.    If you have any questions or concerns, please don't hesitate to send me a message via MyChart or call the office at [phone]. Thank you for visiting with us today! It's our pleasure caring for you.    

## 2021-08-15 NOTE — Progress Notes (Signed)
Subjective  Chief Complaint  Patient presents with   Annual Exam    Patient pregnant  Fasting today     HPI: Jasmine Bullock is a 40 y.o. female who presents to [facility] [place] today for a Female Wellness Visit.  She also has the concerns and/or needs as listed above in the chief complaint. These will be addressed in addition to the Health Maintenance Visit.   Wellness Visit: annual visit with health maintenance review and exam without Pap  Health maintenance: Screens are current and normal.  Recently had 1 round of IVF, beta-hCGs are currently positive.  Awaiting final results today.  She did had a miscarriage from a spontaneous pregnancy this past summer.  Overall she feels well.  No new concerns.  Immunizations are up-to-date.  Chronic disease management visit and/or acute problem visit: General anxiety disorder: Remains well controlled on Celexa daily.  Discussed risks and benefits during pregnancy. Infertility treated with IVF.  She is hopeful this pregnancy will be positive.  Assessment  1. Annual physical exam   2. Pregnancy, unspecified gestational age   79. GAD (generalized anxiety disorder)   4. Female infertility, secondary      Plan  Female Wellness Visit: Age appropriate Health Maintenance and Prevention measures were discussed with patient. Included topics are cancer screening recommendations, ways to keep healthy (see AVS) including dietary and exercise recommendations, regular eye and dental care, use of seat belts, and avoidance of moderate alcohol use and tobacco use.  BMI: discussed patient's BMI and encouraged positive lifestyle modifications to help get to or maintain a target BMI. HM needs and immunizations were addressed and ordered. See below for orders. See HM and immunization section for updates. Routine labs and screening tests ordered including cmp, cbc and lipids where appropriate. Discussed recommendations regarding Vit D and  calcium supplementation (see AVS)  Chronic disease f/u and/or acute problem visit: (deemed necessary to be done in addition to the wellness visit): Generalized anxiety disorder: Well-controlled on Celexa 20 mg daily.  This is a chronic medication.  Continue to use.  Will monitor throughout pregnancy.  Described risks and benefits. Infertility status post IVF: Positive hCG.  She has follow-up with reproductive health today for repeat beta-hCG.  Follow up: 12 months for complete physical  Orders Placed This Encounter  Procedures   CBC with Differential/Platelet   Comprehensive metabolic panel   Hepatitis C antibody   Lipid panel   TSH    No orders of the defined types were placed in this encounter.      Body mass index is 27.82 kg/m. Wt Readings from Last 3 Encounters:  08/15/21 177 lb 9.6 oz (80.6 kg)  07/09/20 173 lb (78.5 kg)  03/25/20 175 lb (79.4 kg)    Patient Active Problem List   Diagnosis Date Noted   GAD (generalized anxiety disorder) 01/28/2020    Priority: High    Diagnosed initially in early 20s; has been well controlled on celexa and prn xanax. Has had panic attacks. Mom and MGM have same.     NSAID induced gastritis 01/28/2020    Priority: Medium     2020 due to mobic use for PF;resolved with one course of PPI, Dr. Tarri Glenn    Female infertility, secondary 06/19/2019    Priority: [place] Maintenance  Topic Date Due   Hepatitis C Screening  Never done   COVID-19 Vaccine (3 - Booster for Pfizer series) 12/11/2019   PAP SMEAR-Modifier  05/20/2022   TETANUS/TDAP  09/11/2022   INFLUENZA VACCINE  Completed   HIV Screening  Completed   Pneumococcal Vaccine 84-48 Years old  Aged Out   HPV VACCINES  Aged Out   Immunization History  Administered Date(s) Administered   Influenza,inj,Quad PF,6+ Mos 06/12/2014, 05/16/2016, 07/06/2017, 09/19/2018, 06/19/2019, 07/09/2020   Influenza-Unspecified 07/18/2021   PFIZER(Purple Top)SARS-COV-2 Vaccination  09/24/2019, 10/16/2019   PPD Test 10/27/2016   Tdap 05/26/2009, 09/11/2012   We updated and reviewed the patient's past history in detail and it is documented below. Allergies: Patient  reports current alcohol use of about 3.0 standard drinks per week. Past Medical History Patient  has a past medical history of Allergy, Anxiety, Asthma, Depression, Female infertility, secondary (06/19/2019), GERD (gastroesophageal reflux disease) (01/28/2020), and Plantar fasciitis. Past Surgical History Patient  has a past surgical history that includes L wrist surgery; Wisdom tooth extraction; and Plantar fascia release (Left, 01/27/2020). Social History   Socioeconomic History   Marital status: Married    Spouse name: Not on file   Number of children: 1   Years of education: Not on file   Highest education level: Not on file  Occupational History   Occupation: dental hygentist   Tobacco Use   Smoking status: Never   Smokeless tobacco: Never  Vaping Use   Vaping Use: Never used  Substance and Sexual Activity   Alcohol use: Yes    Alcohol/week: 3.0 standard drinks    Types: 3 Standard drinks or equivalent per week    Comment: a glass of wine nightly   Drug use: No   Sexual activity: Yes    Partners: Male    Birth control/protection: None    Comment: infertility  Other Topics Concern   Not on file  Social History Narrative   Marital status: Married since 2009; no abuse; happily married      Children: son;       Lives: with husband, son      Employment: Armed forces operational officer; 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      Tobacco: none      Alcohol: weekends; one glass of wine per night.      Exercise:  3-4 days per week; running 3-4 miles      Seatbelt: 100%; no texting   Social Determinants of Corporate investment banker Strain: Not on file  Food Insecurity: Not on file  Transportation Needs: Not on file  Physical Activity: Not on file  Stress: Not on file  Social Connections: Not on file   Family History   Problem Relation Age of Onset   Hypertension Mother    Ulcers Father    Healthy Sister    Ovarian cancer Maternal Grandmother    Alcohol abuse Maternal Grandfather    Hypertension Paternal Grandmother    Hyperlipidemia Paternal Grandmother    Diabetes Paternal Grandfather    Heart disease Paternal Grandfather    Hearing loss Paternal Grandfather    Hypertension Paternal Grandfather    Stroke Paternal Grandfather    Healthy Son    Colon cancer Neg Hx    Rectal cancer Neg Hx    Esophageal cancer Neg Hx    Breast cancer Neg Hx     Review of Systems: Constitutional: negative for fever or malaise Ophthalmic: negative for photophobia, double vision or loss of vision Cardiovascular: negative for chest pain, dyspnea on exertion, or new LE swelling Respiratory: negative for SOB or persistent cough Gastrointestinal: negative for abdominal pain, change in bowel habits or  melena Genitourinary: negative for dysuria or gross hematuria, no abnormal uterine bleeding or disharge Musculoskeletal: negative for new gait disturbance or muscular weakness Integumentary: negative for new or persistent rashes, no breast lumps Neurological: negative for TIA or stroke symptoms Psychiatric: negative for SI or delusions Allergic/Immunologic: negative for hives  Patient Care Team    Relationship Specialty Notifications Start End  Leamon Arnt, MD PCP - General Family Medicine  01/28/20   Thornton Park, MD Consulting Physician Gastroenterology  01/28/20   Wylene Simmer, MD Consulting Physician Orthopedic Surgery  01/28/20   Aloha Gell, MD Consulting Physician Obstetrics and Gynecology  01/28/20     Objective  Vitals: BP 105/67   Pulse 67   Temp 97.7 F (36.5 C) (Temporal)   Ht 5\' 7"  (1.702 m)   Wt 177 lb 9.6 oz (80.6 kg)   LMP 07/14/2021   SpO2 100%   BMI 27.82 kg/m  General:  Well developed, well nourished, no acute distress  Psych:  Alert and orientedx3,normal mood and affect HEENT:   Normocephalic, atraumatic, non-icteric sclera, PERRL, supple neck without adenopathy, mass or thyromegaly Cardiovascular:  Normal S1, S2, RRR without gallop, rub or murmur Respiratory:  Good breath sounds bilaterally, CTAB with normal respiratory effort Gastrointestinal: normal bowel sounds, soft, non-tender, no noted masses. No HSM MSK: no deformities, contusions. Joints are without erythema or swelling.  Skin:  Warm, no rashes or suspicious lesions noted   Commons side effects, risks, benefits, and alternatives for medications and treatment plan prescribed today were discussed, and the patient expressed understanding of the given instructions. Patient is instructed to call or message via MyChart if he/she has any questions or concerns regarding our treatment plan. No barriers to understanding were identified. We discussed Red Flag symptoms and signs in detail. Patient expressed understanding regarding what to do in case of urgent or emergency type symptoms.  Medication list was reconciled, printed and provided to the patient in AVS. Patient instructions and summary information was reviewed with the patient as documented in the AVS. This note was prepared with assistance of Dragon voice recognition software. Occasional wrong-word or sound-a-like substitutions may have occurred due to the inherent limitations of voice recognition software  This visit occurred during the SARS-CoV-2 public health emergency.  Safety protocols were in place, including screening questions prior to the visit, additional usage of staff PPE, and extensive cleaning of exam room while observing appropriate contact time as indicated for disinfecting solutions.

## 2021-08-16 LAB — HEPATITIS C ANTIBODY
Hepatitis C Ab: NONREACTIVE
SIGNAL TO CUT-OFF: 0.02 (ref <1.00)

## 2021-09-11 NOTE — L&D Delivery Note (Addendum)
Delivery Note At 12:31 AM a viable female was delivered via Vaginal, Spontaneous (Presentation: Direct Occiput Anterior).  APGAR: per nursing- good cry and tone, ; weight 8 lb 3.6 oz (3730 g).   Placenta status: Spontaneous, Intact.  Cord: 3 vessels with the following complications: PPH. Prior to placenta delivery bleeding started, fundal massage given. Placental delivery expedited by removal from lower segment- placenta intact. 2nd laceration repaired. Pitocin given. Placenta/ vagina/ blood felt warm, temp 99'5. 3g Unasyn ordered. Pt with intermittent atony and continued bleeding. Methergine, TXA, rectal 1000 mg cytotec and hemabate given, all about 2 min apart. Given intermittent atony Jada placed after about 15-20 min, EBL 1400 .  Cord pH: n/a  Jada left in for about 2-2.5 hr when suction was stopped pt was observed for additional 30 min, uterine tone remained appropriate and no increase in bleeding was noted. Pt was transferred to [facility] unit.   Anesthesia: Epidural Episiotomy: None Lacerations:  62md Suture Repair: 2.0 vicryl rapide Est. Blood Loss (mL):  1400  Mom to postpartum.  Baby to Couplet care / Skin to Skin.  Lendon Colonel 04/16/2022, 3:23 AM   Of notes after delivery of baby, placenta and repair, an addition 60 min was spent in critical care time managing her PPH. A Jada device was placed and continued frequent checks continued over the next 2.5 hrs.   Lendon Colonel 04/16/2022 10:32 AM

## 2021-09-19 DIAGNOSIS — Z3490 Encounter for supervision of normal pregnancy, unspecified, unspecified trimester: Secondary | ICD-10-CM

## 2021-09-19 HISTORY — DX: Encounter for supervision of normal pregnancy, unspecified, unspecified trimester: Z34.90

## 2021-09-26 LAB — OB RESULTS CONSOLE ANTIBODY SCREEN: Antibody Screen: NEGATIVE

## 2021-09-26 LAB — OB RESULTS CONSOLE RUBELLA ANTIBODY, IGM: Rubella: IMMUNE

## 2021-09-26 LAB — OB RESULTS CONSOLE GC/CHLAMYDIA
Chlamydia: NEGATIVE
Neisseria Gonorrhea: NEGATIVE

## 2021-09-26 LAB — OB RESULTS CONSOLE ABO/RH: RH Type: POSITIVE

## 2021-09-26 LAB — OB RESULTS CONSOLE HEPATITIS B SURFACE ANTIGEN: Hepatitis B Surface Ag: NEGATIVE

## 2021-09-26 LAB — OB RESULTS CONSOLE RPR: RPR: NONREACTIVE

## 2021-09-26 LAB — OB RESULTS CONSOLE HIV ANTIBODY (ROUTINE TESTING): HIV: NONREACTIVE

## 2021-11-21 ENCOUNTER — Encounter (HOSPITAL_COMMUNITY): Payer: Self-pay

## 2021-11-21 ENCOUNTER — Other Ambulatory Visit: Payer: Self-pay

## 2021-11-21 ENCOUNTER — Inpatient Hospital Stay (HOSPITAL_COMMUNITY)
Admission: AD | Admit: 2021-11-21 | Discharge: 2021-11-21 | Disposition: A | Discharge status: Home / Self Care | Payer: 59 | Attending: Obstetrics and Gynecology | Admitting: Obstetrics and Gynecology

## 2021-11-21 DIAGNOSIS — K824 Cholesterolosis of gallbladder: Secondary | ICD-10-CM | POA: Insufficient documentation

## 2021-11-21 DIAGNOSIS — Z3492 Encounter for supervision of normal pregnancy, unspecified, second trimester: Secondary | ICD-10-CM

## 2021-11-21 DIAGNOSIS — Z3A18 18 weeks gestation of pregnancy: Secondary | ICD-10-CM | POA: Insufficient documentation

## 2021-11-21 DIAGNOSIS — O219 Vomiting of pregnancy, unspecified: Secondary | ICD-10-CM | POA: Diagnosis present

## 2021-11-21 DIAGNOSIS — O26612 Liver and biliary tract disorders in pregnancy, second trimester: Secondary | ICD-10-CM | POA: Insufficient documentation

## 2021-11-21 DIAGNOSIS — R824 Acetonuria: Secondary | ICD-10-CM

## 2021-11-21 LAB — CBC
HCT: 39.6 % (ref 36.0–46.0)
Hemoglobin: 14.4 g/dL (ref 12.0–15.0)
MCH: 33.7 pg (ref 26.0–34.0)
MCHC: 36.4 g/dL — ABNORMAL HIGH (ref 30.0–36.0)
MCV: 92.7 fL (ref 80.0–100.0)
Platelets: 260 10*3/uL (ref 150–400)
RBC: 4.27 MIL/uL (ref 3.87–5.11)
RDW: 13 % (ref 11.5–15.5)
WBC: 14.7 10*3/uL — ABNORMAL HIGH (ref 4.0–10.5)
nRBC: 0 % (ref 0.0–0.2)

## 2021-11-21 LAB — URINALYSIS, ROUTINE W REFLEX MICROSCOPIC
Bilirubin Urine: NEGATIVE
Glucose, UA: NEGATIVE mg/dL
Hgb urine dipstick: NEGATIVE
Ketones, ur: 80 mg/dL — AB
Nitrite: NEGATIVE
Protein, ur: NEGATIVE mg/dL
Specific Gravity, Urine: 1.025 (ref 1.005–1.030)
pH: 6 (ref 5.0–8.0)

## 2021-11-21 LAB — BASIC METABOLIC PANEL
Anion gap: 10 (ref 5–15)
BUN: 12 mg/dL (ref 6–20)
CO2: 22 mmol/L (ref 22–32)
Calcium: 8.4 mg/dL — ABNORMAL LOW (ref 8.9–10.3)
Chloride: 104 mmol/L (ref 98–111)
Creatinine, Ser: 0.7 mg/dL (ref 0.44–1.00)
GFR, Estimated: >60 mL/min (ref >60)
Glucose, Bld: 121 mg/dL — ABNORMAL HIGH (ref 70–99)
Potassium: 3.8 mmol/L (ref 3.5–5.1)
Sodium: 136 mmol/L (ref 135–145)

## 2021-11-21 MED ORDER — SCOPOLAMINE 1 MG/3DAYS TD PT72
1.0000 | MEDICATED_PATCH | TRANSDERMAL | Status: DC
  Administered 2021-11-21: 1.5 mg via TRANSDERMAL
  Filled 2021-11-21: qty 1

## 2021-11-21 MED ORDER — ONDANSETRON 4 MG PO TBDP
4.0000 mg | ORAL_TABLET | Freq: Once | ORAL | Status: AC
  Administered 2021-11-21: 4 mg via ORAL
  Filled 2021-11-21: qty 1

## 2021-11-21 MED ORDER — LACTATED RINGERS IV BOLUS
1000.0000 mL | Freq: Once | INTRAVENOUS | Status: AC
  Administered 2021-11-21: 1000 mL via INTRAVENOUS

## 2021-11-21 MED ORDER — ONDANSETRON 4 MG PO TBDP: 4.0000 mg | ORAL_TABLET | Freq: Four times a day (QID) | ORAL | 2 refills | Status: DC | PRN

## 2021-11-21 MED ORDER — SCOPOLAMINE 1 MG/3DAYS TD PT72: 1.0000 | MEDICATED_PATCH | TRANSDERMAL | 12 refills | Status: DC

## 2021-11-21 MED ORDER — ONDANSETRON 4 MG PO TBDP
4.0000 mg | ORAL_TABLET | Freq: Four times a day (QID) | ORAL | 2 refills | Status: DC | PRN
Start: 2021-11-21 — End: 2022-04-17

## 2021-11-21 MED ORDER — PROMETHAZINE HCL 25 MG/ML IJ SOLN
25.0000 mg | Freq: Once | INTRAVENOUS | Status: AC
  Administered 2021-11-21: 25 mg via INTRAVENOUS
  Filled 2021-11-21: qty 1

## 2021-11-21 NOTE — MAU Provider Note (Incomplete)
History     CSN: [id number]  Arrival date and time: 11/21/21 1851   Event Date/Time   First Provider Initiated Contact with Patient 11/21/21 1949      Chief Complaint  Patient presents with   Emesis   HPI Jasmine Bullock is a 41 y.o. G3P1001 at [redacted]w[redacted]d who presents to MAU with chief complaint of vomiting. This is a new problem, onset this morning. Patient's close family members have recently recovered from a GI bug which included vomiting and diarrhea. Patient estimates she has vomited about ten times today. Patient has an order for Promethazine but cannot tolerate it.  Patient receives prenatal care with [facility].  OB History     Gravida  3   Para  1   Term  1   Preterm      AB      Living  1      SAB      IAB      Ectopic      Multiple      Live Births  1           Past Medical History:  Diagnosis Date   Allergy    Anxiety    Asthma    childhood   Depression    Female infertility, secondary 06/19/2019   GERD (gastroesophageal reflux disease) 01/28/2020   Plantar fasciitis    left foot    Past Surgical History:  Procedure Laterality Date   L wrist surgery     PLANTAR FASCIA RELEASE Left 01/27/2020   WISDOM TOOTH EXTRACTION      Family History  Problem Relation Age of Onset   Hypertension Mother    Ulcers Father    Healthy Sister    Ovarian cancer Maternal Grandmother    Alcohol abuse Maternal Grandfather    Hypertension Paternal Grandmother    Hyperlipidemia Paternal Grandmother    Diabetes Paternal Grandfather    Heart disease Paternal Grandfather    Hearing loss Paternal Grandfather    Hypertension Paternal Grandfather    Stroke Paternal Grandfather    Healthy Son    Colon cancer Neg Hx    Rectal cancer Neg Hx    Esophageal cancer Neg Hx    Breast cancer Neg Hx     Social History   Tobacco Use   Smoking status: Never   Smokeless tobacco: Never  Vaping Use   Vaping Use: Never used  Substance Use Topics   Alcohol use:  Yes    Alcohol/week: 3.0 standard drinks    Types: 3 Standard drinks or equivalent per week    Comment: a glass of wine nightly   Drug use: No    Allergies: No Known Allergies  Medications Prior to Admission  Medication Sig Dispense Refill Last Dose   citalopram (CELEXA) 20 MG tablet TAKE 1 TABLET(20 MG) BY MOUTH DAILY 90 tablet 3     Review of Systems  Constitutional:  Positive for fatigue.  Gastrointestinal:  Positive for nausea and vomiting.  All other systems reviewed and are negative. Physical Exam   Blood pressure 107/60, pulse 82, temperature 98 F (36.7 C), temperature source Oral, resp. rate 20, height 5\' 7"  (1.702 m), weight 81.3 kg, last menstrual period 07/14/2021, unknown if currently breastfeeding.  Physical Exam Vitals and nursing note reviewed. Exam conducted with a chaperone present.  Constitutional:      Appearance: She is ill-appearing.  Cardiovascular:     Rate and Rhythm: Normal rate and regular rhythm.  Pulses: Normal pulses.     Heart sounds: Normal heart sounds.  Pulmonary:     Effort: Pulmonary effort is normal.     Breath sounds: Normal breath sounds.  Abdominal:     General: Bowel sounds are normal.     Palpations: Abdomen is soft.     Tenderness: There is no abdominal tenderness. There is no right CVA tenderness or left CVA tenderness.     Comments: gravid  Skin:    Capillary Refill: Capillary refill takes less than 2 seconds.  Neurological:     Mental Status: She is oriented to person, place, and time.  Psychiatric:        Mood and Affect: Mood normal.        Behavior: Behavior normal.        Thought Content: Thought content normal.        Judgment: Judgment normal.    MAU Course  Procedures  MDM  Patient Vitals for the past 24 hrs:  BP Temp Temp src Pulse Resp Height Weight  11/21/21 1922 107/60 98 F (36.7 C) Oral 82 20 5\' 7"  (1.702 m) 81.3 kg    Orders Placed This Encounter  Procedures   Urinalysis, Routine w reflex  microscopic Urine, Clean Catch   CBC   Basic metabolic panel   Results for orders placed or performed during the hospital encounter of 11/21/21 (from the past 24 hour(s))  Urinalysis, Routine w reflex microscopic Urine, Clean Catch     Status: Abnormal   Collection Time: 11/21/21  7:31 PM  Result Value Ref Range   Color, Urine YELLOW YELLOW   APPearance HAZY (A) CLEAR   Specific Gravity, Urine 1.025 1.005 - 1.030   pH 6.0 5.0 - 8.0   Glucose, UA NEGATIVE NEGATIVE mg/dL   Hgb urine dipstick NEGATIVE NEGATIVE   Bilirubin Urine NEGATIVE NEGATIVE   Ketones, ur 80 (A) NEGATIVE mg/dL   Protein, ur NEGATIVE NEGATIVE mg/dL   Nitrite NEGATIVE NEGATIVE   Leukocytes,Ua TRACE (A) NEGATIVE   RBC / HPF 0-5 0 - 5 RBC/hpf   WBC, UA 0-5 0 - 5 WBC/hpf   Bacteria, UA RARE (A) NONE SEEN   Squamous Epithelial / LPF 0-5 0 - 5   Mucus PRESENT   CBC     Status: Abnormal   Collection Time: 11/21/21  8:03 PM  Result Value Ref Range   WBC 14.7 (H) 4.0 - 10.5 K/uL   RBC 4.27 3.87 - 5.11 MIL/uL   Hemoglobin 14.4 12.0 - 15.0 g/dL   HCT 46.939.6 62.936.0 - 52.846.0 %   MCV 92.7 80.0 - 100.0 fL   MCH 33.7 26.0 - 34.0 pg   MCHC 36.4 (H) 30.0 - 36.0 g/dL   RDW 41.313.0 24.411.5 - 01.015.5 %   Platelets 260 150 - 400 K/uL   nRBC 0.0 0.0 - 0.2 %  Basic metabolic panel     Status: Abnormal   Collection Time: 11/21/21  8:03 PM  Result Value Ref Range   Sodium 136 135 - 145 mmol/L   Potassium 3.8 3.5 - 5.1 mmol/L   Chloride 104 98 - 111 mmol/L   CO2 22 22 - 32 mmol/L   Glucose, Bld 121 (H) 70 - 99 mg/dL   BUN 12 6 - 20 mg/dL   Creatinine, Ser 2.720.70 0.44 - 1.00 mg/dL   Calcium 8.4 (L) 8.9 - 10.3 mg/dL   GFR, Estimated >53>60 >66>60 mL/min   Anion gap 10 5 - 15    Meds  ordered this encounter  Medications   ondansetron (ZOFRAN-ODT) disintegrating tablet 4 mg   lactated ringers bolus 1,000 mL   promethazine (PHENERGAN) 25 mg in lactated ringers 1,000 mL infusion    80 ketones in UA, GI bug   scopolamine (TRANSDERM-SCOP) 1 MG/3DAYS  1.5 mg   scopolamine (TRANSDERM-SCOP) 1 MG/3DAYS    Sig: Place 1 patch (1.5 mg total) onto the skin every 3 (three) days.    Dispense:  10 patch    Refill:  12    Order Specific Question:   Supervising Provider    Answer:   Mariel Aloe A [[id number]]   ondansetron (ZOFRAN-ODT) 4 MG disintegrating tablet    Sig: Take 1 tablet (4 mg total) by mouth every 6 (six) hours as needed for nausea.    Dispense:  20 tablet    Refill:  2    Order Specific Question:   Supervising Provider    Answer:   Warden Fillers [id number]    Assessment and Plan  ***  Calvert Cantor 11/21/2021, 8:03 PM

## 2021-11-21 NOTE — Discharge Instructions (Signed)
Vomiting in Pregnancy Follow these instructions at home: To help relieve your symptoms, listen to your body. Everyone is different and has different preferences. Find what works best for you. Here are some things you can try to help relieve your symptoms: Meals and snacks Eat 5-6 small meals daily instead of 3 large meals. Eating small meals and snacks can help you avoid an empty stomach. Before getting out of bed, eat a couple of crackers to avoid moving around on an empty stomach. Eat a protein-rich snack before bed. Examples include cheese and crackers, or a peanut butter sandwich made with 1 slice of whole-wheat bread and 1 tsp (5 g) of peanut butter. Eat and drink slowly. Try eating starchy foods as these are usually tolerated well. Examples include cereal, toast, bread, potatoes, pasta, rice, and pretzels. Eat at least one serving of protein with your meals and snacks. Protein options include lean meats, poultry, seafood, beans, nuts, nut butters, eggs, cheese, and yogurt. Eat or suck on things that have ginger in them. It may help to relieve nausea. Add  tsp (0.44 g) ground ginger to hot tea, or choose ginger tea.   Fluids It is important to stay hydrated. Try to: Drink small amounts of fluids often. Drink fluids 30 minutes before or after a meal to help lessen the feeling of a full stomach. Drink 100% fruit juice or an electrolyte drink. An electrolyte drink contains sodium, potassium, and chloride. Drink fluids that are cold, clear, and carbonated or sour. These include lemonade, ginger ale, lemon-lime soda, ice water, and sparkling water. Things to avoid Avoid the following: Eating foods that trigger your symptoms. These may include spicy foods, coffee, high-fat foods, very sweet foods, and acidic foods. Drinking more than 1 cup of fluid at a time. Skipping meals. Nausea can be more intense on an empty stomach. If you cannot tolerate food, do not force it. Try sucking on ice chips or  other frozen items and make up for missed calories later. Lying down within 2 hours after eating. Being exposed to environmental triggers. These may include food smells, smoky rooms, closed spaces, rooms with strong smells, warm or humid places, overly loud and noisy rooms, and rooms with motion or flickering lights. Try eating meals in a well-ventilated area that is free of strong smells. Making quick and sudden changes in your movement. Taking iron pills and multivitamins that contain iron. If you take prescription iron pills, do not stop taking them unless your health care provider approves. Preparing food. The smell of food can spoil your appetite or trigger nausea. General instructions Brush your teeth or use a mouth rinse after meals. Take over-the-counter and prescription medicines only as told by your health care provider. Follow instructions from your health care provider about eating or drinking restrictions. Talk with your health care provider about starting a supplement of vitamin B6. Continue to take your prenatal vitamins as told by your health care provider. If you are having trouble taking your prenatal vitamins, talk with your health care provider about other options. Keep all follow-up visits. This is important. Follow-up visits include prenatal visits. Contact a health care provider if: You have pain in your abdomen. You have a severe headache. You have vision problems. You are losing weight. You feel weak or dizzy. You cannot eat or drink without vomiting, especially if this goes on for a full day. Get help right away if: You cannot drink fluids without vomiting. You vomit blood. You have constant nausea   and vomiting. You are very weak. You faint. You have a fever and your symptoms suddenly get worse. Summary Making some changes to your eating habits may help relieve nausea and vomiting. This condition may be managed with lifestyle changes and medicines as prescribed  by your health care provider. If medicines do not help relieve nausea and vomiting, you may need to receive fluids through an IV at the hospital. This information is not intended to replace advice given to you by your health care provider. Make sure you discuss any questions you have with your health care provider. Document Revised: 03/22/2020 Document Reviewed: 03/22/2020 Elsevier Patient Education  2021 [employer] Inc[employer]  

## 2021-11-21 NOTE — MAU Note (Signed)
PT SAYS SON HAS HAD STOMACH BUG  ?PT HAS BEEN VOMITING ALL DAY ?NO DIARRHEA  ?CALLED 530PM- TOLD TO COME IN ?[facility] WITH DR [facility]  ? ?

## 2021-12-15 DIAGNOSIS — B9689 Other specified bacterial agents as the cause of diseases classified elsewhere: Secondary | ICD-10-CM

## 2021-12-15 DIAGNOSIS — B3731 Acute candidiasis of vulva and vagina: Secondary | ICD-10-CM | POA: Insufficient documentation

## 2021-12-15 DIAGNOSIS — O23599 Infection of other part of genital tract in pregnancy, unspecified trimester: Secondary | ICD-10-CM | POA: Insufficient documentation

## 2021-12-15 HISTORY — DX: Other specified bacterial agents as the cause of diseases classified elsewhere: B96.89

## 2021-12-15 HISTORY — DX: Acute candidiasis of vulva and vagina: B37.31

## 2022-01-29 ENCOUNTER — Inpatient Hospital Stay (HOSPITAL_COMMUNITY)
Admission: AD | Admit: 2022-01-29 | Discharge: 2022-01-29 | Disposition: A | Discharge status: Home / Self Care | Payer: 59 | Attending: Obstetrics & Gynecology | Admitting: Obstetrics & Gynecology

## 2022-01-29 ENCOUNTER — Encounter (HOSPITAL_COMMUNITY): Payer: Self-pay | Admitting: Obstetrics & Gynecology

## 2022-01-29 ENCOUNTER — Other Ambulatory Visit: Payer: Self-pay

## 2022-01-29 DIAGNOSIS — O26893 Other specified pregnancy related conditions, third trimester: Secondary | ICD-10-CM | POA: Diagnosis present

## 2022-01-29 DIAGNOSIS — R102 Pelvic and perineal pain unspecified side: Secondary | ICD-10-CM

## 2022-01-29 DIAGNOSIS — O26899 Other specified pregnancy related conditions, unspecified trimester: Secondary | ICD-10-CM | POA: Diagnosis not present

## 2022-01-29 DIAGNOSIS — O09523 Supervision of elderly multigravida, third trimester: Secondary | ICD-10-CM | POA: Diagnosis not present

## 2022-01-29 DIAGNOSIS — Z3A28 28 weeks gestation of pregnancy: Secondary | ICD-10-CM | POA: Diagnosis not present

## 2022-01-29 LAB — URINALYSIS, ROUTINE W REFLEX MICROSCOPIC
Bilirubin Urine: NEGATIVE
Glucose, UA: NEGATIVE mg/dL
Hgb urine dipstick: NEGATIVE
Ketones, ur: NEGATIVE mg/dL
Leukocytes,Ua: NEGATIVE
Nitrite: NEGATIVE
Protein, ur: NEGATIVE mg/dL
Specific Gravity, Urine: 1.012 (ref 1.005–1.030)
pH: 6 (ref 5.0–8.0)

## 2022-01-29 MED ORDER — CYCLOBENZAPRINE HCL 10 MG PO TABS
10.0000 mg | ORAL_TABLET | Freq: Three times a day (TID) | ORAL | 0 refills | Status: DC | PRN
Start: 2022-01-29 — End: 2022-04-17

## 2022-01-29 NOTE — MAU Provider Note (Signed)
History     [id number]  Arrival date and time: 01/29/22 [id number]    Chief Complaint  Patient presents with   Abdominal Pain     HPI Jasmine Bullock is a 41 y.o. at [redacted]w[redacted]d who presents for abdominal pain. Symptoms started yesterday morning. Reports intermittent pain in left lower abdomen that occurs with movement & repositioning. Hasn't treated symptoms. Some nausea this morning. Denies fever, flank pain, vomiting, contractions, dysuria, hematuria, vaginal bleeding, diarrhea, constipation, or leaking of fluid. Reports good fetal movement.    OB History     Gravida  4   Para  1   Term  1   Preterm      AB  2   Living  1      SAB  1   IAB      Ectopic  1   Multiple      Live Births  1           Past Medical History:  Diagnosis Date   Allergy    Anxiety    Asthma    childhood   Depression    Female infertility, secondary 06/19/2019   GERD (gastroesophageal reflux disease) 01/28/2020   Plantar fasciitis    left foot    Past Surgical History:  Procedure Laterality Date   L wrist surgery     PLANTAR FASCIA RELEASE Left 01/27/2020   WISDOM TOOTH EXTRACTION      Family History  Problem Relation Age of Onset   Hypertension Mother    Ulcers Father    Healthy Sister    Ovarian cancer Maternal Grandmother    Alcohol abuse Maternal Grandfather    Hypertension Paternal Grandmother    Hyperlipidemia Paternal Grandmother    Diabetes Paternal Grandfather    Heart disease Paternal Grandfather    Hearing loss Paternal Grandfather    Hypertension Paternal Grandfather    Stroke Paternal Grandfather    Healthy Son    Colon cancer Neg Hx    Rectal cancer Neg Hx    Esophageal cancer Neg Hx    Breast cancer Neg Hx     No Known Allergies  No current facility-administered medications on file prior to encounter.   Current Outpatient Medications on File Prior to Encounter  Medication Sig Dispense Refill   aspirin 81 MG chewable tablet Chew 81 mg by mouth  daily.     citalopram (CELEXA) 20 MG tablet TAKE 1 TABLET(20 MG) BY MOUTH DAILY 90 tablet 3   folic acid (FOLVITE) 1 MG tablet Take 2 mg by mouth daily.     multivitamin prental (TRINATAL) 60-1 MG TABS tablet Take by mouth.     ondansetron (ZOFRAN-ODT) 4 MG disintegrating tablet Take 1 tablet (4 mg total) by mouth every 6 (six) hours as needed for nausea. 20 tablet 2   scopolamine (TRANSDERM-SCOP) 1 MG/3DAYS Place 1 patch (1.5 mg total) onto the skin every 3 (three) days. 10 patch 12     ROS Pertinent positives and negative per HPI, all others reviewed and negative  Physical Exam   BP 106/69 (BP Location: Left Arm)   Pulse 76   Temp 98.4 F (36.9 C) (Oral)   LMP 07/14/2021   SpO2 98%   Patient Vitals for the past 24 hrs:  BP Temp Temp src Pulse SpO2  01/29/22 0835 106/69 -- -- 76 --  01/29/22 0822 -- -- -- -- 98 %  01/29/22 0739 -- -- -- -- 97 %  01/29/22 0736 108/63 98.4  F (36.9 C) Oral 76 94 %    Physical Exam Vitals and nursing note reviewed.  Constitutional:      General: She is not in acute distress.    Appearance: She is well-developed. She is not toxic-appearing.  HENT:     Head: Normocephalic and atraumatic.  Pulmonary:     Effort: Pulmonary effort is normal. No respiratory distress.  Abdominal:     Palpations: Abdomen is soft.     Tenderness: There is abdominal tenderness in the left lower quadrant. There is no right CVA tenderness or left CVA tenderness.     Hernia: No hernia is present.     Comments: gravid  Skin:    General: Skin is warm and dry.  Neurological:     Mental Status: She is alert.  Psychiatric:        Mood and Affect: Mood normal.        Behavior: Behavior normal.     Cervical Exam Dilation: Closed Effacement (%): Thick Exam by:: Juel Burrow, NP  FHT Baseline 150, moderate variability, 15x15 accels, no decels Toco: none Cat: 1  Labs Results for orders placed or performed during the hospital encounter of 01/29/22 (from the past 24  hour(s))  Urinalysis, Routine w reflex microscopic     Status: Abnormal   Collection Time: 01/29/22  7:44 AM  Result Value Ref Range   Color, Urine YELLOW YELLOW   APPearance HAZY (A) CLEAR   Specific Gravity, Urine 1.012 1.005 - 1.030   pH 6.0 5.0 - 8.0   Glucose, UA NEGATIVE NEGATIVE mg/dL   Hgb urine dipstick NEGATIVE NEGATIVE   Bilirubin Urine NEGATIVE NEGATIVE   Ketones, ur NEGATIVE NEGATIVE mg/dL   Protein, ur NEGATIVE NEGATIVE mg/dL   Nitrite NEGATIVE NEGATIVE   Leukocytes,Ua NEGATIVE NEGATIVE    Imaging No results found.  MAU Course  Procedures Lab Orders         Urinalysis, Routine w reflex microscopic     Meds ordered this encounter  Medications   cyclobenzaprine (FLEXERIL) 10 MG tablet    Sig: Take 1 tablet (10 mg total) by mouth 3 (three) times daily as needed for muscle spasms.    Dispense:  15 tablet    Refill:  0    Order Specific Question:   Supervising Provider    Answer:   Aletha Halim [id number]   Imaging Orders  No imaging studies ordered today    MDM Patient presents with left sided abdominal pain that occurs with movement. No other symptoms.  Reactive NST. No contractions. No CVA tenderness. Some TTP in left lower quadrant. Abdomen soft & no rebound.  Afebrile. Negative u/a.  Cervix closed/thick.   Discussed symptoms MSK vs round ligament pain. Offered meds in MAU. Patient prefers prescription to try medication at home. Will return if symptoms worsen.   Reviewed [facility] prenatal records scanned under media. IVF pregnancy, hx of MAB & ectopic. Current pregnancy uncomplicated. Has f/u in 2 weeks.  Assessment and Plan   1. Pain of round ligament affecting pregnancy, antepartum   2. [redacted] weeks gestation of pregnancy    -Rx flexeril. Tylenol prn.  -Encouraged to start wearing maternity support belt -Discussed reasons to return to MAU  -Keep f/u with OB  Jorje Guild, NP 01/29/22 8:54 AM

## 2022-01-29 NOTE — MAU Note (Addendum)
Jasmine Bullock is a 41 y.o. at [redacted]w[redacted]d here in MAU reporting: lower abdominal pain that occurs with movement. The pain began 01/28/2022 and was dull but today 01/29/2022 the pain has been sharp. Pt denies VB or discharge. +FM  Onset of complaint: 01/28/2022 Pain score: 7/10 Vitals:   01/29/22 0736  BP: 108/63  Pulse: 76  Temp: 98.4 F (36.9 C)  SpO2: 94%     FHT:140 Lab orders placed from triage:  UA

## 2022-01-29 NOTE — Discharge Instructions (Signed)
Recommend pregnancy support belt/maternity support belt.  One brand is called Prenatal Cradle You may find these on [employer].com, [email or web address], or [employer].com

## 2022-03-14 ENCOUNTER — Inpatient Hospital Stay (HOSPITAL_COMMUNITY)
Admission: AD | Admit: 2022-03-14 | Discharge: 2022-03-14 | Disposition: A | Discharge status: Home / Self Care | Payer: 59 | Attending: Obstetrics and Gynecology | Admitting: Obstetrics and Gynecology

## 2022-03-14 ENCOUNTER — Other Ambulatory Visit: Payer: Self-pay

## 2022-03-14 ENCOUNTER — Encounter (HOSPITAL_COMMUNITY): Payer: Self-pay | Admitting: Obstetrics and Gynecology

## 2022-03-14 DIAGNOSIS — O99613 Diseases of the digestive system complicating pregnancy, third trimester: Secondary | ICD-10-CM | POA: Diagnosis not present

## 2022-03-14 DIAGNOSIS — Z3689 Encounter for other specified antenatal screening: Secondary | ICD-10-CM

## 2022-03-14 DIAGNOSIS — O09523 Supervision of elderly multigravida, third trimester: Secondary | ICD-10-CM | POA: Insufficient documentation

## 2022-03-14 DIAGNOSIS — O99513 Diseases of the respiratory system complicating pregnancy, third trimester: Secondary | ICD-10-CM | POA: Insufficient documentation

## 2022-03-14 DIAGNOSIS — Z3A34 34 weeks gestation of pregnancy: Secondary | ICD-10-CM | POA: Diagnosis not present

## 2022-03-14 DIAGNOSIS — K219 Gastro-esophageal reflux disease without esophagitis: Secondary | ICD-10-CM | POA: Diagnosis not present

## 2022-03-14 DIAGNOSIS — O99343 Other mental disorders complicating pregnancy, third trimester: Secondary | ICD-10-CM | POA: Diagnosis not present

## 2022-03-14 DIAGNOSIS — O368130 Decreased fetal movements, third trimester, not applicable or unspecified: Secondary | ICD-10-CM | POA: Diagnosis present

## 2022-03-14 DIAGNOSIS — Z7982 Long term (current) use of aspirin: Secondary | ICD-10-CM | POA: Diagnosis not present

## 2022-03-14 NOTE — MAU Provider Note (Signed)
History     CSN: [id number]  Arrival date and time: 03/14/22 0458   Event Date/Time   First Provider Initiated Contact with Patient 03/14/22 0545      Chief Complaint  Patient presents with   Decreased Fetal Movement   Jasmine Bullock is a 41 y.o. [id number] at [redacted]w[redacted]d who receives care at [facility].  She states next appointment is scheduled for either July 10 or 11 and she sees Dr. Darrold Span as her primary.  She presents today for Decreased Fetal Movement.  She states she awoke at 4 AM and had that perceived movement for 30 minutes.  Says she ate something sweet and drink something with no response.  However she says that upon arrival to the hospital fetal movement detected.  She denies contractions, vaginal bleeding, or abnormal discharge.    OB History     Gravida  4   Para  1   Term  1   Preterm      AB  2   Living  1      SAB  1   IAB      Ectopic  1   Multiple      Live Births  1           Past Medical History:  Diagnosis Date   Allergy    Anxiety    Asthma    childhood   Depression    Female infertility, secondary 06/19/2019   GERD (gastroesophageal reflux disease) 01/28/2020   Plantar fasciitis    left foot    Past Surgical History:  Procedure Laterality Date   L wrist surgery     PLANTAR FASCIA RELEASE Left 01/27/2020   WISDOM TOOTH EXTRACTION      Family History  Problem Relation Age of Onset   Hypertension Mother    Ulcers Father    Healthy Sister    Ovarian cancer Maternal Grandmother    Alcohol abuse Maternal Grandfather    Hypertension Paternal Grandmother    Hyperlipidemia Paternal Grandmother    Diabetes Paternal Grandfather    Heart disease Paternal Grandfather    Hearing loss Paternal Grandfather    Hypertension Paternal Grandfather    Stroke Paternal Grandfather    Healthy Son    Colon cancer Neg Hx    Rectal cancer Neg Hx    Esophageal cancer Neg Hx    Breast cancer Neg Hx     Social History   Tobacco Use    Smoking status: Never   Smokeless tobacco: Never  Vaping Use   Vaping Use: Never used  Substance Use Topics   Alcohol use: Not Currently    Alcohol/week: 3.0 standard drinks of alcohol    Types: 3 Standard drinks or equivalent per week    Comment: a glass of wine nightly   Drug use: No    Allergies: No Known Allergies  Medications Prior to Admission  Medication Sig Dispense Refill Last Dose   aspirin 81 MG chewable tablet Chew 81 mg by mouth daily.   03/13/2022   calcium acetate, Phos Binder, (PHOSLYRA) 667 MG/5ML SOLN Take by mouth 3 (three) times daily with meals.   03/13/2022   citalopram (CELEXA) 20 MG tablet TAKE 1 TABLET(20 MG) BY MOUTH DAILY 90 tablet 3 03/13/2022   folic acid (FOLVITE) 1 MG tablet Take 2 mg by mouth daily.   03/13/2022   multivitamin prental (TRINATAL) 60-1 MG TABS tablet Take by mouth.   03/13/2022   promethazine (  PHENERGAN) 12.5 MG tablet Take 12.5 mg by mouth every 6 (six) hours as needed for nausea or vomiting.   03/13/2022   cyclobenzaprine (FLEXERIL) 10 MG tablet Take 1 tablet (10 mg total) by mouth 3 (three) times daily as needed for muscle spasms. 15 tablet 0    ondansetron (ZOFRAN-ODT) 4 MG disintegrating tablet Take 1 tablet (4 mg total) by mouth every 6 (six) hours as needed for nausea. 20 tablet 2    scopolamine (TRANSDERM-SCOP) 1 MG/3DAYS Place 1 patch (1.5 mg total) onto the skin every 3 (three) days. 10 patch 12     Review of Systems  Gastrointestinal:  Negative for nausea and vomiting.  Genitourinary:  Negative for difficulty urinating, dysuria, vaginal bleeding and vaginal discharge.   Physical Exam   Blood pressure 109/65, pulse 78, temperature 98.1 F (36.7 C), temperature source Oral, resp. rate 17, height 5\' 7"  (1.702 m), weight 92.4 kg, last menstrual period 07/14/2021, SpO2 96 %, unknown if currently breastfeeding.  Physical Exam Vitals reviewed.  Constitutional:      Appearance: Normal appearance.  HENT:     Head: Normocephalic and  atraumatic.  Eyes:     Conjunctiva/sclera: Conjunctivae normal.  Cardiovascular:     Rate and Rhythm: Normal rate.  Pulmonary:     Effort: Pulmonary effort is normal. No respiratory distress.  Musculoskeletal:        General: Normal range of motion.     Cervical back: Normal range of motion.  Neurological:     Mental Status: She is alert and oriented to person, place, and time.  Psychiatric:        Mood and Affect: Mood normal.        Behavior: Behavior normal.     Fetal Assessment 135 bpm, Mod Var, -Decels, +Accels Toco: No ctx graphed  MAU Course  No results found for this or any previous visit (from the past 24 hour(s)). No results found.  MDM PE Labs: None EFM  Assessment and Plan  41 year old G4P1021  SIUP at 34.4 weeks Cat I FT DFM   -Patient reports movement since arrival. -Questions when she should be evaluated for DFM.  States primary ob informed her that baby should have >/=10 m/m per hour.  Provider agrees, but also identifies that every baby has different patterns and can have more or less m/m on average. Encouraged to come in whenever concern is present.  Further encouraged to call office for guidance, despite time, when unsure. -Will continue to monitor and plan for discharge with report of continued movement.  -Reassured that NST is reactive.   46 MSN, CNM 03/14/2022, 5:45 AM   Reassessment (6:00 AM)  -NST remains reactive. -Fetal movement with >10 in 30 minutes. -Nurse to provide precautions. -Encouraged to call primary office or return to MAU if symptoms worsen or with the onset of new symptoms. -Discharged to home in stable condition.  05/15/2022 MSN, CNM Advanced Practice Provider, [facility]

## 2022-03-14 NOTE — MAU Note (Signed)
Jasmine Bullock is a 41 y.o. at [redacted]w[redacted]d here in MAU reporting: Pt woke up around 4 am and wasn't feeling baby have 10 movements in an hour. Came to be evaluated. No bleeding or LOF. LMP:  Onset of complaint: 1 hour Pain score: 0/10 Vitals:   03/14/22 0515  BP: 114/64  Pulse: 82  Resp: 18  Temp: 98.1 F (36.7 C)  SpO2: 99%     FHT:138  Lab orders placed from triage:

## 2022-03-20 LAB — OB RESULTS CONSOLE GBS: GBS: NEGATIVE

## 2022-04-04 ENCOUNTER — Telehealth (HOSPITAL_COMMUNITY): Payer: Self-pay | Admitting: *Deleted

## 2022-04-04 NOTE — Telephone Encounter (Signed)
Preadmission screen  

## 2022-04-06 ENCOUNTER — Encounter (HOSPITAL_COMMUNITY): Payer: Self-pay

## 2022-04-07 ENCOUNTER — Telehealth (HOSPITAL_COMMUNITY): Payer: Self-pay | Admitting: *Deleted

## 2022-04-07 ENCOUNTER — Encounter (HOSPITAL_COMMUNITY): Payer: Self-pay | Admitting: *Deleted

## 2022-04-07 NOTE — Telephone Encounter (Signed)
Preadmission screen  

## 2022-04-12 ENCOUNTER — Other Ambulatory Visit: Payer: Self-pay | Admitting: Obstetrics

## 2022-04-13 NOTE — H&P (Addendum)
Jasmine Bullock is a 41 y.o. [id number] at [redacted]w[redacted]d presenting for IOL at term due to AMA >40yo. Pt notes rare contractions. Good fetal movement, No vaginal bleeding, not leaking fluid.  PNCare at [facility] since 10 wks - Dated by IVF - AMA, nl PGT, nl Panorama, nl AFP - ANxiety, on Celexa 20mg / day - heterogenous MTHFR. On folic acid and baby ASA - fetal growth- 35 wks, 6'7/ 76%, vtx, ant placenta   Prenatal Transfer Tool  Maternal Diabetes: No Genetic Screening: Normal Maternal Ultrasounds/Referrals: Normal Fetal Ultrasounds or other Referrals:  None Maternal Substance Abuse:  No Significant Maternal Medications:  None Significant Maternal Lab Results: Group B Strep negative     OB History     Gravida  4   Para  1   Term  1   Preterm      AB  2   Living  1      SAB  1   IAB      Ectopic  1   Multiple      Live Births  1          Past Medical History:  Diagnosis Date   Allergy    Anxiety    Asthma    childhood   Depression    Female infertility, secondary 06/19/2019   GERD (gastroesophageal reflux disease) 01/28/2020   Plantar fasciitis    left foot   Past Surgical History:  Procedure Laterality Date   L wrist surgery     PLANTAR FASCIA RELEASE Left 01/27/2020   WISDOM TOOTH EXTRACTION     Family History: family history includes Alcohol abuse in her maternal grandfather; Diabetes in her paternal grandfather; Healthy in her sister and son; Hearing loss in her paternal grandfather; Heart disease in her paternal grandfather; Hyperlipidemia in her paternal grandmother; Hypertension in her mother, paternal grandfather, and paternal grandmother; Ovarian cancer in her maternal grandmother; Stroke in her paternal grandfather; Ulcers in her father. Social History:  reports that she has never smoked. She has never used smokeless tobacco. She reports that she does not currently use alcohol after a past usage of about 3.0 standard drinks of alcohol per week.  She reports that she does not use drugs.  Review of Systems - Negative except anxiety, discomfort of pregnancy      Physical Exam:  Vitals:   04/14/22 0644 04/14/22 0655 04/14/22 0739  BP:  110/69 110/71  Pulse:  80 77  Resp:  16 16  Temp:  97.9 F (36.6 C)   TempSrc:  Oral   Weight: 97 kg    Height: 5\' 7"  (1.702 m)       Prenatal labs: ABO, Rh: O/Positive/-- (01/16 0000) Antibody: Negative (01/16 0000) Rubella: Immune (01/16 0000) RPR: Nonreactive (01/16 0000)  HBsAg: Negative (01/16 0000)  HIV: Non-reactive (01/16 0000)  GBS: Negative/-- (07/10 0000)  1 hr Glucola 99  Genetic screening nl PGT, nl Panorama Anatomy 09-20-1972 normal  CBC    Component Value Date/Time   WBC 14.0 (H) 04/14/2022 0635   RBC 3.92 04/14/2022 0635   HGB 13.1 04/14/2022 0635   HGB 13.9 06/27/2019 0930   HCT 37.2 04/14/2022 0635   HCT 40.0 06/27/2019 0930   PLT 199 04/14/2022 0635   PLT 247 06/27/2019 0930   MCV 94.9 04/14/2022 0635   MCV 93 06/27/2019 0930   MCH 33.4 04/14/2022 0635   MCHC 35.2 04/14/2022 0635   RDW 13.2 04/14/2022 0635   RDW 11.4 (L) 06/27/2019  0930   LYMPHSABS 1.9 08/15/2021 1126   LYMPHSABS 2.0 06/27/2019 0930   MONOABS 0.5 08/15/2021 1126   EOSABS 0.1 08/15/2021 1126   EOSABS 0.2 06/27/2019 0930   BASOSABS 0.0 08/15/2021 1126   BASOSABS 0.0 06/27/2019 0930    Assessment/Plan: 41 y.o. G4P1021 at [redacted]w[redacted]d -AMA, plan elective IOL at 39 wks. Plan cytotec x 1 then pit 2x2. Will try to avoid foley if possible as pt with poor experience with foley in past   [employer] 04/13/2022, 6:24 PM  H&P update- exam upon seeing pt at 3p Gen- well appearing, no disttress, slightly anxious Skin: Warm and dry Abdomen: Soft, gravid, nontender, no right upper quadrant pain GU: Deferred Lower extremity: Nontender, no edema  Toco: Irritability q. 46 minutes FH: 145's, positive accelerations, no decelerations, 10 beat variability  Assessment and plan continue with induction of  labor.  Patient has had Cytotec x2 with minimal change to her cervix.  Given multiparous status will move to Pitocin 2 x 2.  Patient aware option for cervical Foley bulb but declines at this time.  Reactive fetal testing.  Lendon Colonel 04/14/2022 4:22 PM

## 2022-04-14 ENCOUNTER — Inpatient Hospital Stay (HOSPITAL_COMMUNITY)
Admission: AD | Admit: 2022-04-14 | Discharge: 2022-04-17 | DRG: 768 | Disposition: A | Discharge status: Home / Self Care | Payer: 59 | Attending: Obstetrics | Admitting: Obstetrics

## 2022-04-14 ENCOUNTER — Inpatient Hospital Stay (HOSPITAL_COMMUNITY): Payer: 59

## 2022-04-14 ENCOUNTER — Encounter (HOSPITAL_COMMUNITY): Payer: Self-pay | Admitting: Obstetrics

## 2022-04-14 ENCOUNTER — Other Ambulatory Visit: Payer: Self-pay

## 2022-04-14 DIAGNOSIS — D62 Acute posthemorrhagic anemia: Secondary | ICD-10-CM | POA: Diagnosis not present

## 2022-04-14 DIAGNOSIS — F411 Generalized anxiety disorder: Secondary | ICD-10-CM | POA: Diagnosis present

## 2022-04-14 DIAGNOSIS — O09523 Supervision of elderly multigravida, third trimester: Secondary | ICD-10-CM

## 2022-04-14 DIAGNOSIS — O26893 Other specified pregnancy related conditions, third trimester: Principal | ICD-10-CM | POA: Diagnosis present

## 2022-04-14 DIAGNOSIS — O99344 Other mental disorders complicating childbirth: Secondary | ICD-10-CM | POA: Diagnosis present

## 2022-04-14 DIAGNOSIS — Z3A39 39 weeks gestation of pregnancy: Secondary | ICD-10-CM | POA: Diagnosis not present

## 2022-04-14 DIAGNOSIS — O9081 Anemia of the puerperium: Secondary | ICD-10-CM | POA: Diagnosis not present

## 2022-04-14 DIAGNOSIS — Z3490 Encounter for supervision of normal pregnancy, unspecified, unspecified trimester: Secondary | ICD-10-CM | POA: Diagnosis present

## 2022-04-14 LAB — CBC
HCT: 37.2 % (ref 36.0–46.0)
Hemoglobin: 13.1 g/dL (ref 12.0–15.0)
MCH: 33.4 pg (ref 26.0–34.0)
MCHC: 35.2 g/dL (ref 30.0–36.0)
MCV: 94.9 fL (ref 80.0–100.0)
Platelets: 199 10*3/uL (ref 150–400)
RBC: 3.92 MIL/uL (ref 3.87–5.11)
RDW: 13.2 % (ref 11.5–15.5)
WBC: 14 10*3/uL — ABNORMAL HIGH (ref 4.0–10.5)
nRBC: 0 % (ref 0.0–0.2)

## 2022-04-14 LAB — RPR: RPR Ser Ql: NONREACTIVE

## 2022-04-14 LAB — TYPE AND SCREEN
ABO/RH(D): O POS
Antibody Screen: NEGATIVE

## 2022-04-14 MED ORDER — LIDOCAINE HCL (PF) 1 % IJ SOLN
30.0000 mL | INTRAMUSCULAR | Status: DC | PRN
Start: 2022-04-14 — End: 2022-04-16

## 2022-04-14 MED ORDER — EPHEDRINE 5 MG/ML INJ: 10.0000 mg | INTRAVENOUS | Status: DC | PRN

## 2022-04-14 MED ORDER — TERBUTALINE SULFATE 1 MG/ML IJ SOLN: 0.2500 mg | Freq: Once | INTRAMUSCULAR | Status: DC | PRN

## 2022-04-14 MED ORDER — PHENYLEPHRINE 80 MCG/ML (10ML) SYRINGE FOR IV PUSH (FOR BLOOD PRESSURE SUPPORT): 80.0000 ug | PREFILLED_SYRINGE | INTRAVENOUS | Status: DC | PRN

## 2022-04-14 MED ORDER — DIPHENHYDRAMINE HCL 50 MG/ML IJ SOLN: 12.5000 mg | INTRAMUSCULAR | Status: DC | PRN

## 2022-04-14 MED ORDER — OXYTOCIN-SODIUM CHLORIDE 30-0.9 UT/500ML-% IV SOLN
2.5000 [IU]/h | INTRAVENOUS | Status: DC
  Filled 2022-04-14: qty 500

## 2022-04-14 MED ORDER — LACTATED RINGERS IV SOLN: INTRAVENOUS | Status: DC

## 2022-04-14 MED ORDER — LACTATED RINGERS IV SOLN
500.0000 mL | INTRAVENOUS | Status: DC | PRN
  Administered 2022-04-15: 1000 mL via INTRAVENOUS

## 2022-04-14 MED ORDER — ACETAMINOPHEN 325 MG PO TABS: 650.0000 mg | ORAL_TABLET | ORAL | Status: DC | PRN

## 2022-04-14 MED ORDER — MISOPROSTOL 50MCG HALF TABLET
50.0000 ug | ORAL_TABLET | ORAL | Status: DC
  Administered 2022-04-14 (×2): 50 ug via ORAL
  Filled 2022-04-14 (×2): qty 1

## 2022-04-14 MED ORDER — OXYTOCIN-SODIUM CHLORIDE 30-0.9 UT/500ML-% IV SOLN
1.0000 m[IU]/min | INTRAVENOUS | Status: DC
  Administered 2022-04-14 – 2022-04-15 (×2): 2 m[IU]/min via INTRAVENOUS
  Filled 2022-04-14: qty 500

## 2022-04-14 MED ORDER — PHENYLEPHRINE 80 MCG/ML (10ML) SYRINGE FOR IV PUSH (FOR BLOOD PRESSURE SUPPORT)
80.0000 ug | PREFILLED_SYRINGE | INTRAVENOUS | Status: DC | PRN
  Filled 2022-04-14: qty 10

## 2022-04-14 MED ORDER — ONDANSETRON HCL 4 MG/2ML IJ SOLN
4.0000 mg | Freq: Four times a day (QID) | INTRAMUSCULAR | Status: DC | PRN
Start: 2022-04-14 — End: 2022-04-16
  Administered 2022-04-15 (×2): 4 mg via INTRAVENOUS
  Filled 2022-04-14 (×2): qty 2

## 2022-04-14 MED ORDER — LACTATED RINGERS IV SOLN: 500.0000 mL | Freq: Once | INTRAVENOUS | Status: DC

## 2022-04-14 MED ORDER — SOD CITRATE-CITRIC ACID 500-334 MG/5ML PO SOLN: 30.0000 mL | ORAL | Status: DC | PRN

## 2022-04-14 MED ORDER — OXYTOCIN BOLUS FROM INFUSION: 333.0000 mL | Freq: Once | INTRAVENOUS | Status: DC

## 2022-04-14 MED ORDER — FENTANYL-BUPIVACAINE-NACL 0.5-0.125-0.9 MG/250ML-% EP SOLN
12.0000 mL/h | EPIDURAL | Status: DC | PRN
  Administered 2022-04-15: 12 mL/h via EPIDURAL
  Filled 2022-04-14: qty 250

## 2022-04-14 NOTE — Progress Notes (Signed)
[id number] Jasmine Bullock 08/22/1981  Patient informed that the ultrasound is considered a limited OB ultrasound and is not intended to be a complete ultrasound exam.  Patient also informed that the ultrasound is not being completed with the intent of assessing for fetal or placental anomalies or any pelvic abnormalities.  Explained that the purpose of today's ultrasound is to assess for  presentation.  Patient acknowledges the purpose of the exam and the limitations of the study.  Cephalic presentation noted.   Cherre Robins, CNM 04/14/2022, 7:39 AM

## 2022-04-14 NOTE — Progress Notes (Signed)
Attempted to insert foley bulb unsuccessful by Dr Ernestina Penna.

## 2022-04-14 NOTE — Progress Notes (Signed)
S: Doing well, no complaints, pain well controlled as only in early labor.  Mostly resting.  Good fetal movement  O: BP 117/68   Pulse 74   Temp (!) 97.5 F (36.4 C) (Oral)   Resp 16   Ht 5\' 7"  (1.702 m)   Wt 97 kg   LMP 07/14/2021   BMI 33.49 kg/m    FHT:  FHR: 135's bpm, variability: moderate,  accelerations:  Present,  decelerations:  Absent UC:   irregular, every 3-7 minutes SVE:   Dilation: 1 Effacement (%): 70 Station: -3 Exam by:: Dr [email or web address]  Exam by me now: Fingertip, 0%, vertex -3 Consent for attempted cervical Foley balloon.  Patient aware cervix is long and posterior may not be able to successfully place Foley.  Using sterile gloves and with digital technique Foley catheter attempted to be threaded through the cervix.  However cervix was posterior and about 4 cm long could only advance the cervical balloon to about midway through the cervix.  Decision made to abandon the procedure.  Patient tolerated well  A / P:  41 y.o.  OB History  Gravida Para Term Preterm AB Living  4 1 1  0 2 1  SAB IAB Ectopic Multiple Live Births  1 0 1 0 1   at [redacted]w[redacted]d Induction of labor at term due to advanced maternal age.  Very slow progress.  Status post Cytotec x2.  Unable to successfully place cervical Foley balloon.  Patient was started on Pitocin around 4 PM however in the last 6 hours she is only up to 8 milliunits.  We will continue with Pitocin but if does not enter active labor over the next 4 hours reconsider Pitocin break and another Cytotec overnight.  Fetal Wellbeing:  Category I Pain Control:  Labor support without medications  Anticipated MOD:  NSVD  04/14/2022, 9:59 PM 3

## 2022-04-15 ENCOUNTER — Inpatient Hospital Stay (HOSPITAL_COMMUNITY): Payer: 59 | Admitting: Anesthesiology

## 2022-04-15 MED ORDER — LIDOCAINE HCL (PF) 1 % IJ SOLN
INTRAMUSCULAR | Status: DC | PRN
  Administered 2022-04-15: 11 mL via EPIDURAL

## 2022-04-15 MED ORDER — MISOPROSTOL 25 MCG QUARTER TABLET
25.0000 ug | ORAL_TABLET | ORAL | Status: DC | PRN
  Administered 2022-04-15: 25 ug via VAGINAL
  Filled 2022-04-15: qty 1

## 2022-04-15 MED ORDER — FENTANYL CITRATE (PF) 100 MCG/2ML IJ SOLN
100.0000 ug | Freq: Once | INTRAMUSCULAR | Status: AC
  Administered 2022-04-15: 100 ug via INTRAVENOUS
  Filled 2022-04-15: qty 2

## 2022-04-15 NOTE — Anesthesia Procedure Notes (Signed)
Epidural Patient location during procedure: OB Start time: 04/15/2022 1:53 PM End time: 04/15/2022 2:07 PM  Staffing Anesthesiologist: Lowella Curb, MD Performed: anesthesiologist   Preanesthetic Checklist Completed: patient identified, IV checked, site marked, risks and benefits discussed, surgical consent, monitors and equipment checked, pre-op evaluation and timeout performed  Epidural Patient position: sitting Prep: ChloraPrep Patient monitoring: heart rate, cardiac monitor, continuous pulse ox and blood pressure Approach: midline Location: L2-L3 Injection technique: LOR saline  Needle:  Needle type: Tuohy  Needle gauge: 17 G Needle length: 9 cm Needle insertion depth: 6 cm Catheter type: closed end flexible Catheter size: 20 Guage Catheter at skin depth: 10 cm Test dose: negative  Assessment Events: blood not aspirated, injection not painful, no injection resistance, no paresthesia and negative IV test  Additional Notes Epidural placed by SRNA under direct supervisionReason for block:procedure for pain

## 2022-04-15 NOTE — Plan of Care (Signed)

## 2022-04-15 NOTE — Progress Notes (Signed)
S: Doing well, no complaints, pain well controlled with epidural and patient has been able to sleep this afternoon  O: BP 104/62   Pulse 74   Temp 98.2 F (36.8 C) (Oral)   Resp 18   Ht 5\' 7"  (1.702 m)   Wt 97 kg   LMP 07/14/2021   SpO2 100%   BMI 33.49 kg/m    FHT:  FHR: 120s bpm, variability: moderate,  accelerations:  Present,  decelerations:  Absent UC:   regular, every 3-4 minutes SVE:   Dilation: 5 Effacement (%): 20 Station: -3 Exam by:: Dr [email or web address] AROM clear and voluminous   A / P:  41 y.o.  OB History  Gravida Para Term Preterm AB Living  4 1 1  0 2 1  SAB IAB Ectopic Multiple Live Births  1 0 1 0 1   at [redacted]w[redacted]d Induction of labor at term due to advanced maternal age, slow progress, expect more active progress now with artificial rupture of membranes.  Cervix remains long and dilation is due to cervical Foley.  Fetal Wellbeing:  Category I Pain Control:  Epidural  Anticipated MOD:  NSVD  04/15/2022, 6:27 PM

## 2022-04-15 NOTE — Progress Notes (Signed)
S: Doing well, no complaints, pain well controlled as not in active labor. Remains very anxious.   Pitocin up to 22 units yesterday, stopped from 2a to 6a while pt received vaginal cytotec then pit resumed  O: BP 108/62   Pulse 75   Temp 98.3 F (36.8 C) (Axillary)   Resp 16   Ht 5\' 7"  (1.702 m)   Wt 97 kg   LMP 07/14/2021   SpO2 99%   BMI 33.49 kg/m    FHT:  FHR: 135s bpm, variability: moderate,  accelerations:  Present,  decelerations:  Absent UC:   irregular SVE:   Dilation: 2 Effacement (%): 70 Station: -2 Exam by:: K.Louis-Charles, RN  Cvx exam by me: int os FT, ext os 1.5cm, cvx 4 cm long/ 0% effaced, firm, posterior  Cervical foley placement: Consent obtained from patient.  100 mg IV fentanyl given, sterile speculum placed in the vagina but given patient's attention and narrow speculum excessive vaginal tissue obstructing view of the cervical os.  Large Graves then used.  Betadine x3 using a ring forcep to study the cervix and a second ring forcep on the Foley tubing the Foley was threaded through the cervix and inflated with 60 cc of saline.  This was taped to the leg.  Patient tolerated well.  A / P:  41 y.o.  OB History  Gravida Para Term Preterm AB Living  4 1 1  0 2 1  SAB IAB Ectopic Multiple Live Births  1 0 1 0 1   at [redacted]w[redacted]d Induction of labor at term for advanced maternal age over the age of 32.  Slow progress with induction.  Patient has had Cytotec x2, full day of Pitocin, repeat Cytotec x1 overnight and is now back on Pitocin.  Cervical Foley placed and awaiting for patient to get into labor.  Fetal Wellbeing:  Category I Pain Control:   IV fentanyl for cervical Foley placement  Anticipated MOD:  NSVD  [redacted]w[redacted]d 04/15/2022, 11:26 AM

## 2022-04-15 NOTE — Anesthesia Preprocedure Evaluation (Signed)
Anesthesia Evaluation  Patient identified by MRN, date of birth, ID band Patient awake    Reviewed: Allergy & Precautions, NPO status , Patient's Chart, lab work & pertinent test results  Airway Mallampati: II  TM Distance: >3 FB Neck ROM: Full    Dental no notable dental hx.    Pulmonary asthma ,    Pulmonary exam normal breath sounds clear to auscultation       Cardiovascular negative cardio ROS Normal cardiovascular exam Rhythm:Regular Rate:Normal     Neuro/Psych Anxiety Depression negative neurological ROS  negative psych ROS   GI/Hepatic Neg liver ROS, GERD  ,  Endo/Other  negative endocrine ROS  Renal/GU negative Renal ROS  negative genitourinary   Musculoskeletal negative musculoskeletal ROS (+)   Abdominal   Peds negative pediatric ROS (+)  Hematology negative hematology ROS (+)   Anesthesia Other Findings   Reproductive/Obstetrics (+) Pregnancy                             Anesthesia Physical Anesthesia Plan  ASA: 2  Anesthesia Plan: Epidural   Post-op Pain Management:    Induction:   PONV Risk Score and Plan:   Airway Management Planned:   Additional Equipment:   Intra-op Plan:   Post-operative Plan:   Informed Consent:   Plan Discussed with:   Anesthesia Plan Comments:         Anesthesia Quick Evaluation

## 2022-04-16 LAB — CBC
HCT: 29.3 % — ABNORMAL LOW (ref 36.0–46.0)
Hemoglobin: 10.3 g/dL — ABNORMAL LOW (ref 12.0–15.0)
MCH: 33.8 pg (ref 26.0–34.0)
MCHC: 35.2 g/dL (ref 30.0–36.0)
MCV: 96.1 fL (ref 80.0–100.0)
Platelets: 200 10*3/uL (ref 150–400)
RBC: 3.05 MIL/uL — ABNORMAL LOW (ref 3.87–5.11)
RDW: 13.3 % (ref 11.5–15.5)
WBC: 23.9 10*3/uL — ABNORMAL HIGH (ref 4.0–10.5)
nRBC: 0 % (ref 0.0–0.2)

## 2022-04-16 MED ORDER — TRANEXAMIC ACID-NACL 1000-0.7 MG/100ML-% IV SOLN
INTRAVENOUS | Status: AC
  Filled 2022-04-16: qty 100

## 2022-04-16 MED ORDER — OXYCODONE HCL 5 MG PO TABS: 5.0000 mg | ORAL_TABLET | ORAL | Status: DC | PRN

## 2022-04-16 MED ORDER — LACTATED RINGERS IV SOLN: INTRAVENOUS | Status: DC

## 2022-04-16 MED ORDER — DIPHENHYDRAMINE HCL 25 MG PO CAPS: 25.0000 mg | ORAL_CAPSULE | Freq: Four times a day (QID) | ORAL | Status: DC | PRN

## 2022-04-16 MED ORDER — MISOPROSTOL 200 MCG PO TABS
ORAL_TABLET | ORAL | Status: AC
  Filled 2022-04-16: qty 5

## 2022-04-16 MED ORDER — METHYLERGONOVINE MALEATE 0.2 MG/ML IJ SOLN
INTRAMUSCULAR | Status: AC
  Filled 2022-04-16: qty 1

## 2022-04-16 MED ORDER — DIPHENOXYLATE-ATROPINE 2.5-0.025 MG PO TABS
1.0000 | ORAL_TABLET | Freq: Four times a day (QID) | ORAL | Status: DC | PRN
  Administered 2022-04-16: 2 via ORAL
  Filled 2022-04-16: qty 2

## 2022-04-16 MED ORDER — TRANEXAMIC ACID-NACL 1000-0.7 MG/100ML-% IV SOLN
1000.0000 mg | Freq: Once | INTRAVENOUS | Status: AC
  Administered 2022-04-16: 1000 mg via INTRAVENOUS

## 2022-04-16 MED ORDER — COCONUT OIL OIL: 1.0000 | TOPICAL_OIL | Status: DC | PRN

## 2022-04-16 MED ORDER — OXYCODONE HCL 5 MG PO TABS: 10.0000 mg | ORAL_TABLET | ORAL | Status: DC | PRN

## 2022-04-16 MED ORDER — AMPICILLIN-SULBACTAM SODIUM 3 (2-1) G IJ SOLR
3.0000 g | Freq: Once | INTRAMUSCULAR | Status: AC
  Administered 2022-04-16: 3 g via INTRAVENOUS
  Filled 2022-04-16: qty 8

## 2022-04-16 MED ORDER — IBUPROFEN 600 MG PO TABS
600.0000 mg | ORAL_TABLET | Freq: Four times a day (QID) | ORAL | Status: DC
  Administered 2022-04-16 – 2022-04-17 (×6): 600 mg via ORAL
  Filled 2022-04-16 (×6): qty 1

## 2022-04-16 MED ORDER — ZOLPIDEM TARTRATE 5 MG PO TABS: 5.0000 mg | ORAL_TABLET | Freq: Every evening | ORAL | Status: DC | PRN

## 2022-04-16 MED ORDER — TETANUS-DIPHTH-ACELL PERTUSSIS 5-2.5-18.5 LF-MCG/0.5 IM SUSY: 0.5000 mL | PREFILLED_SYRINGE | Freq: Once | INTRAMUSCULAR | Status: DC

## 2022-04-16 MED ORDER — ONDANSETRON HCL 4 MG PO TABS: 4.0000 mg | ORAL_TABLET | ORAL | Status: DC | PRN

## 2022-04-16 MED ORDER — CEFAZOLIN IN SODIUM CHLORIDE 3-0.9 GM/100ML-% IV SOLN
3.0000 g | Freq: Once | INTRAVENOUS | Status: DC
Start: 2022-04-16 — End: 2022-04-16
  Filled 2022-04-16: qty 100

## 2022-04-16 MED ORDER — METHYLERGONOVINE MALEATE 0.2 MG/ML IJ SOLN
0.2000 mg | Freq: Once | INTRAMUSCULAR | Status: AC
  Administered 2022-04-16: 0.2 mg via INTRAMUSCULAR

## 2022-04-16 MED ORDER — SENNOSIDES-DOCUSATE SODIUM 8.6-50 MG PO TABS
2.0000 | ORAL_TABLET | ORAL | Status: DC
  Administered 2022-04-17 (×2): 2 via ORAL
  Filled 2022-04-16 (×3): qty 2

## 2022-04-16 MED ORDER — DIBUCAINE (PERIANAL) 1 % EX OINT: 1.0000 | TOPICAL_OINTMENT | CUTANEOUS | Status: DC | PRN

## 2022-04-16 MED ORDER — PRENATAL MULTIVITAMIN CH
1.0000 | ORAL_TABLET | Freq: Every day | ORAL | Status: DC
  Administered 2022-04-16 – 2022-04-17 (×2): 1 via ORAL
  Filled 2022-04-16 (×2): qty 1

## 2022-04-16 MED ORDER — SIMETHICONE 80 MG PO CHEW: 80.0000 mg | CHEWABLE_TABLET | ORAL | Status: DC | PRN

## 2022-04-16 MED ORDER — CITALOPRAM HYDROBROMIDE 20 MG PO TABS
10.0000 mg | ORAL_TABLET | Freq: Every day | ORAL | Status: DC
  Administered 2022-04-17: 10 mg via ORAL
  Filled 2022-04-16 (×2): qty 1

## 2022-04-16 MED ORDER — CARBOPROST TROMETHAMINE 250 MCG/ML IM SOLN
250.0000 ug | Freq: Once | INTRAMUSCULAR | Status: AC
Start: 2022-04-16 — End: 2022-04-16

## 2022-04-16 MED ORDER — MISOPROSTOL 200 MCG PO TABS
1000.0000 ug | ORAL_TABLET | Freq: Once | ORAL | Status: AC
Start: 2022-04-16 — End: 2022-04-16
  Administered 2022-04-16: 1000 ug via RECTAL

## 2022-04-16 MED ORDER — ACETAMINOPHEN 325 MG PO TABS: 650.0000 mg | ORAL_TABLET | ORAL | Status: DC | PRN

## 2022-04-16 MED ORDER — WITCH HAZEL-GLYCERIN EX PADS: 1.0000 | MEDICATED_PAD | CUTANEOUS | Status: DC | PRN

## 2022-04-16 MED ORDER — LACTATED RINGERS IV BOLUS: 1000.0000 mL | Freq: Once | INTRAVENOUS | Status: DC

## 2022-04-16 MED ORDER — BENZOCAINE-MENTHOL 20-0.5 % EX AERO
1.0000 | INHALATION_SPRAY | CUTANEOUS | Status: DC | PRN
  Administered 2022-04-16: 1 via TOPICAL
  Filled 2022-04-16: qty 56

## 2022-04-16 MED ORDER — ONDANSETRON HCL 4 MG/2ML IJ SOLN: 4.0000 mg | INTRAMUSCULAR | Status: DC | PRN

## 2022-04-16 MED ORDER — CARBOPROST TROMETHAMINE 250 MCG/ML IM SOLN
INTRAMUSCULAR | Status: AC
  Administered 2022-04-16: 250 ug via INTRAMUSCULAR
  Filled 2022-04-16: qty 1

## 2022-04-16 NOTE — Lactation Note (Signed)
This note was copied from a baby's chart. Lactation Consultation Note  Patient Name: Boy Ashleigh Luckow [id number] Date: 04/16/2022   Age:41 hours LC talked with RN on Vocera, RN will call LC back to confirm if Birth Parent would like to be seen by [facility] services in L&D.  Maternal Data    Feeding    LATCH Score                    Lactation Tools Discussed/Used    Interventions    Discharge    Consult Status      Danelle Earthly 04/16/2022, 2:14 AM

## 2022-04-16 NOTE — Progress Notes (Signed)
PPD#0  Pt breastfeeding with good latch, Notes minimal bleeding, more with voiding and moving. Pt notes no longer with nausea or dizziness. Fatigue present  Vitals:   04/16/22 0500 04/16/22 0600 04/16/22 0910 04/16/22 0924  BP: 104/65 (!) 87/49 (!) 93/45 (!) 110/57  Pulse: 72 78  72  Resp: 16 16 20    Temp: 98.1 F (36.7 C) 98.5 F (36.9 C) 99.2 F (37.3 C)   TempSrc: Oral Oral Oral   SpO2: 98% 99%    Weight:      Height:        Gen: no distress, well-appearing Skin: Warm and dry, good color Abdomen: Fundus at the umbilicus  CBC pending  BMP: Postpartum hemorrhage after prolonged induction of labor and vaginal delivery.  Appears stable at this time   04/16/2022 10:17 AM

## 2022-04-16 NOTE — Lactation Note (Signed)
This note was copied from a baby's chart. Lactation Consultation Note  Patient Name: Jasmine Bullock [id number] Date: 04/16/2022 Reason for consult: Initial assessment;Mother's request;Term;Breastfeeding assistance Age:41 hours  P2, Term, Infant Female, Infant at 8 hours  LC entered the room and baby was being held by the birthing parent. Per the birthing parent, things have been going well with breastfeeding. She states that he last ate 3 hours ago.   LC spoke with the parents about infant behavior on day 1 & 2 of life, milk production, supply and demand, hand expression, stimulation, and cluster feeding.   The parent stated that with her last baby she was sore and used a nipple shield, then became an exclusive pumper.   LC encouraged the birthing parent to call for latch assistance to make sure that baby has a good latch.   LC reviewed outpatient services brochure.   Current Feeding Plan:  Breastfeed baby 8+ times in 24 hours.  Hand express for stimulation and feed expressed milk to baby.  Call RN/LC for latch assistance.   Maternal Data Has patient been taught Hand Expression?: Yes Does the patient have breastfeeding experience prior to this delivery?: Yes  Feeding    LATCH Score Latch: Grasps breast easily, tongue down, lips flanged, rhythmical sucking.  Audible Swallowing: A few with stimulation  Type of Nipple: Everted at rest and after stimulation  Comfort (Breast/Nipple): Soft / non-tender  Hold (Positioning): Assistance needed to correctly position infant at breast and maintain latch.  LATCH Score: 8   Lactation Tools Discussed/Used    Interventions Interventions: Breast feeding basics reviewed;Education;LC Services brochure  Discharge Pump: DEBP;Personal WIC Program: No  Consult Status Consult Status: Follow-up Date: 04/17/22 Follow-up type: In-patient    Delene Loll 04/16/2022, 8:56 AM

## 2022-04-16 NOTE — Anesthesia Postprocedure Evaluation (Signed)
Anesthesia Post Note  Patient: Jasmine Bullock  Procedure(s) Performed: AN AD HOC LABOR EPIDURAL     Patient location during evaluation: Mother Baby Anesthesia Type: Epidural Level of consciousness: awake and alert Pain management: pain level controlled Vital Signs Assessment: post-procedure vital signs reviewed and stable Respiratory status: spontaneous breathing, nonlabored ventilation and respiratory function stable Cardiovascular status: stable Postop Assessment: no headache, no backache, epidural receding, no apparent nausea or vomiting, patient able to bend at knees, adequate PO intake and able to ambulate Anesthetic complications: no   No notable events documented.  Last Vitals:  Vitals:   04/16/22 0500 04/16/22 0600  BP: 104/65 (!) 87/49  Pulse: 72 78  Resp: 16 16  Temp: 36.7 C 36.9 C  SpO2: 98% 99%    Last Pain:  Vitals:   04/16/22 0749  TempSrc:   PainSc: 0-No pain   Pain Goal:                   [employer]

## 2022-04-16 NOTE — Lactation Note (Signed)
This note was copied from a baby's chart. Lactation Consultation Note  Patient Name: Boy Emberlee Sortino [id number] Date: 04/16/2022 Reason for consult: Follow-up assessment;Mother's request;Term;Breastfeeding assistance Age:41 hours  P2, Term, Infant Female, Infant at 9 hours  LC entered the room and baby was latched to left breast in the cradle position. Lips were flanged, tongue was down, sucking was rhythmic, and some swallows were noted.   All questions were answered.   Birthing parent will call LC/RN for assistance when latch when needed.   Maternal Data Has patient been taught Hand Expression?: Yes Does the patient have breastfeeding experience prior to this delivery?: Yes  Feeding Mother's Current Feeding Choice: Breast Milk  LATCH Score Latch: Grasps breast easily, tongue down, lips flanged, rhythmical sucking.  Audible Swallowing: A few with stimulation  Type of Nipple: Everted at rest and after stimulation  Comfort (Breast/Nipple): Soft / non-tender  Hold (Positioning): No assistance needed to correctly position infant at breast.  LATCH Score: 9   Lactation Tools Discussed/Used    Interventions Interventions: Education  Discharge Pump: DEBP;Personal WIC Program: No  Consult Status Consult Status: Follow-up Date: 04/17/22 Follow-up type: In-patient    Orvil Feil Glynn Freas 04/16/2022, 10:07 AM

## 2022-04-17 ENCOUNTER — Encounter (HOSPITAL_COMMUNITY): Payer: Self-pay | Admitting: Obstetrics

## 2022-04-17 DIAGNOSIS — D62 Acute posthemorrhagic anemia: Secondary | ICD-10-CM | POA: Diagnosis not present

## 2022-04-17 LAB — CBC WITH DIFFERENTIAL/PLATELET
Abs Immature Granulocytes: 0.3 10*3/uL — ABNORMAL HIGH (ref 0.00–0.07)
Basophils Absolute: 0.1 10*3/uL (ref 0.0–0.1)
Basophils Relative: 0 %
Eosinophils Absolute: 0.2 10*3/uL (ref 0.0–0.5)
Eosinophils Relative: 1 %
HCT: 23 % — ABNORMAL LOW (ref 36.0–46.0)
Hemoglobin: 8.2 g/dL — ABNORMAL LOW (ref 12.0–15.0)
Immature Granulocytes: 2 %
Lymphocytes Relative: 17 %
Lymphs Abs: 2.4 10*3/uL (ref 0.7–4.0)
MCH: 34.3 pg — ABNORMAL HIGH (ref 26.0–34.0)
MCHC: 35.7 g/dL (ref 30.0–36.0)
MCV: 96.2 fL (ref 80.0–100.0)
Monocytes Absolute: 1 10*3/uL (ref 0.1–1.0)
Monocytes Relative: 7 %
Neutro Abs: 10.8 10*3/uL — ABNORMAL HIGH (ref 1.7–7.7)
Neutrophils Relative %: 73 %
Platelets: 161 10*3/uL (ref 150–400)
RBC: 2.39 MIL/uL — ABNORMAL LOW (ref 3.87–5.11)
RDW: 13.6 % (ref 11.5–15.5)
WBC: 14.8 10*3/uL — ABNORMAL HIGH (ref 4.0–10.5)
nRBC: 0 % (ref 0.0–0.2)

## 2022-04-17 MED ORDER — IBUPROFEN 600 MG PO TABS: 600.0000 mg | ORAL_TABLET | Freq: Four times a day (QID) | ORAL | 0 refills | Status: DC

## 2022-04-17 MED ORDER — ACETAMINOPHEN 325 MG PO TABS: 650.0000 mg | ORAL_TABLET | ORAL | 1 refills | Status: DC | PRN

## 2022-04-17 MED ORDER — MAGNESIUM OXIDE -MG SUPPLEMENT 400 (240 MG) MG PO TABS: 400.0000 mg | ORAL_TABLET | Freq: Every day | ORAL | Status: DC

## 2022-04-17 MED ORDER — MAGNESIUM OXIDE -MG SUPPLEMENT 400 (240 MG) MG PO TABS: 400.0000 mg | ORAL_TABLET | Freq: Every day | ORAL | 1 refills | Status: DC

## 2022-04-17 MED ORDER — POLYSACCHARIDE IRON COMPLEX 150 MG PO CAPS: 150.0000 mg | ORAL_CAPSULE | Freq: Every day | ORAL | Status: DC

## 2022-04-17 MED ORDER — POLYSACCHARIDE IRON COMPLEX 150 MG PO CAPS: 150.0000 mg | ORAL_CAPSULE | Freq: Every day | ORAL | 1 refills | Status: DC

## 2022-04-17 MED ORDER — IRON SUCROSE 20 MG/ML IV SOLN
500.0000 mg | Freq: Once | INTRAVENOUS | Status: AC
  Administered 2022-04-17: 500 mg via INTRAVENOUS
  Filled 2022-04-17: qty 500

## 2022-04-17 NOTE — Discharge Summary (Signed)
OB Discharge Summary  Patient Name: Jasmine Bullock DOB: Feb 26, 1981 MRN: [id number]  Date of admission: 04/14/2022 Delivering provider: Noland Fordyce   Admitting diagnosis: Encounter for induction of labor [Z34.90] Intrauterine pregnancy: [redacted]w[redacted]d     Secondary diagnosis: Patient Active Problem List   Diagnosis Date Noted   Anemia associated with acute blood loss 04/17/2022   SVD 8/6 04/16/2022   Postpartum care following vaginal delivery 8/6 04/16/2022   Postpartum hemorrhage 1348cc 04/16/2022   Encounter for induction of labor 04/14/2022   GAD (generalized anxiety disorder) 01/28/2020    Date of discharge: 04/17/2022   Discharge diagnosis: Principal Problem:   Postpartum care following vaginal delivery 8/6 Active Problems:   GAD (generalized anxiety disorder)   Encounter for induction of labor   SVD 8/6   Postpartum hemorrhage 1348cc   Anemia associated with acute blood loss                                                            Augmentation: AROM, Pitocin, Cytotec, and IP Foley Pain control: Epidural  Laceration: None Complications: Hemorrhage>1041mL  Hospital course:  Induction of Labor With Vaginal Delivery   41 y.o. yo [id number] at [redacted]w[redacted]d was admitted to the hospital 04/14/2022 for induction of labor.  Indication for induction: AMA.  Patient had an uncomplicated labor course as follows: Membrane Rupture Time/Date: 5:59 PM ,04/15/2022   Delivery Method:Vaginal, Spontaneous  Episiotomy: None  Lacerations:    Details of delivery can be found in separate delivery note. Patient had a postpartum course complicated by anemia. She was given IV Venofer on PP day #1. She remains asymptomatic. Patient is discharged home 04/17/22.  Newborn Data: Birth date:04/16/2022  Birth time:12:31 AM  Gender:Female  Living status:Living  Apgars:8 ,9  Weight:3730 g   Physical exam  Vitals:   04/16/22 1713 04/16/22 2350 04/17/22 0500 04/17/22 1443  BP: 104/69 100/70 98/65 97/67   Pulse: 73 70  73 78  Resp: 20 18 18 20   Temp:  98.2 F (36.8 C) 98.1 F (36.7 C) 98.6 F (37 C)  TempSrc:  Oral Oral Oral  SpO2:  100% 99% 96%  Weight:      Height:       General: alert and cooperative Lochia: appropriate Uterine Fundus: firm Perineum: intact DVT Evaluation: No evidence of DVT seen on physical exam.  Labs: Lab Results  Component Value Date   WBC 14.8 (H) 04/17/2022   HGB 8.2 (L) 04/17/2022   HCT 23.0 (L) 04/17/2022   MCV 96.2 04/17/2022   PLT 161 04/17/2022      04/17/2022    9:49 AM  Edinburgh Postnatal Depression Scale Screening Tool  I have been able to laugh and see the funny side of things. 0  I have looked forward with enjoyment to things. 0  I have blamed myself unnecessarily when things went wrong. 1  I have been anxious or worried for no good reason. 1  I have felt scared or panicky for no good reason. 1  Things have been getting on top of me. 1  I have been so unhappy that I have had difficulty sleeping. 0  I have felt sad or miserable. 1  I have been so unhappy that I have been crying. 1  The thought of harming myself has occurred  to me. 0  Edinburgh Postnatal Depression Scale Total 6   Discharge instructions:  per After Visit Summary  After Visit Meds:  Allergies as of 04/17/2022   No Known Allergies      Medication List     STOP taking these medications    aspirin 81 MG chewable tablet   calcium acetate (Phos Binder) 667 MG/5ML Soln Commonly known as: PHOSLYRA   cyclobenzaprine 10 MG tablet Commonly known as: FLEXERIL   folic acid 1 MG tablet Commonly known as: FOLVITE   ondansetron 4 MG disintegrating tablet Commonly known as: ZOFRAN-ODT   promethazine 12.5 MG tablet Commonly known as: PHENERGAN   scopolamine 1 MG/3DAYS Commonly known as: TRANSDERM-SCOP       TAKE these medications    acetaminophen 325 MG tablet Commonly known as: Tylenol Take 2 tablets (650 mg total) by mouth every 4 (four) hours as needed (for pain  scale < 4).   citalopram 20 MG tablet Commonly known as: CELEXA TAKE 1 TABLET(20 MG) BY MOUTH DAILY   ibuprofen 600 MG tablet Commonly known as: ADVIL Take 1 tablet (600 mg total) by mouth every 6 (six) hours.   iron polysaccharides 150 MG capsule Commonly known as: Ferrex 150 Take 1 capsule (150 mg total) by mouth daily.   magnesium oxide 400 (240 Mg) MG tablet Commonly known as: MAG-OX Take 1 tablet (400 mg total) by mouth daily.   multivitamin prental 60-1 MG Tabs tablet Take by mouth.       Activity: Advance as tolerated. Pelvic rest for 6 weeks.   Newborn Data: Live born female  Birth Weight: 8 lb 3.6 oz (3730 g) APGAR: 8, 9  Newborn Delivery   Birth date/time: 04/16/2022 00:31:00 Delivery type: Vaginal, Spontaneous      Named Christin Fudge Baby Feeding: Bottle and Breast Disposition:home with mother  Delivery Report:  Review the Delivery Report for details.    Follow up:  Follow-up Information     Noland Fordyce, MD. Schedule an appointment as soon as possible for a visit in 1 week(s).   Specialty: Obstetrics and Gynecology Why: For a postpartum check. Contact information: [address] [place] [address] [phone]                Clancy Gourd, MSN 04/17/2022, 4:51 PM

## 2022-04-17 NOTE — Social Work (Signed)
MOB was referred for history of depression/anxiety.  * Referral screened out by Clinical Social Worker because none of the following criteria appear to apply: ~ History of anxiety/depression during this pregnancy, or of post-partum depression following prior delivery. ~ Diagnosis of anxiety and/or depression within last 3 years OR * MOB's symptoms currently being treated with medication and/or therapy. Per chart review, MOB takes Celexa 20mg . MOB completed score/6.    Please contact the Clinical Social Worker if needs arise, by [facility] request, or if MOB scores greater than 9/yes to question 10 on Edinburgh Postpartum Depression Screen.   11-17-1977, MSW, LCSW [facility]  Clinical Social Worker  [phone] 04/17/2022  11:31 AM

## 2022-04-17 NOTE — Lactation Note (Signed)
This note was copied from a baby's chart. Lactation Consultation Note  Patient Name: Jasmine Bullock [id number] Date: 04/17/2022 Reason for consult: Follow-up assessment;Term Age:41 hours   P2: Term baby at 39+2 weeks Feeding preference: Breast  Arrived to find baby asleep STS on birth parent's chest.  Spent time discussing feeding plan and answering parent's questions.  Encouraged to feed 8-12 times/24 hours or sooner if baby shows feeding cues.  Suggested continued STS, breast massage and hand expression.  Birth parent states that baby prefers one breast over the other breast.  Reassured her that this is typical and to continue using both breasts when latching.  Reviewed breast feeding basics and possible sleepiness after circumcision.  Support person reported giving formula last night for peace of mind.  Acknowledged that supplementing with formula is okay, remembering to latch prior to giving formula supplementation.  Both parents receptive to all teaching; excited to be going home.  Parents have our op phone number for any further questions/concerns.   Maternal Data    Feeding Mother's Current Feeding Choice: Breast Milk Nipple Type: Slow - flow  LATCH Score                    Lactation Tools Discussed/Used    Interventions Interventions: Education  Discharge Discharge Education: Engorgement and breast care  Consult Status Consult Status: Complete Date: 04/17/22 Follow-up type: Call as needed    Irene Pap Josyah Achor 04/17/2022, 4:57 PM

## 2022-04-17 NOTE — Progress Notes (Signed)
   PPD#1 S/P NSVD  Live born female  Birth Weight: 8 lb 3.6 oz (3730 g) APGAR: 8, 9  Newborn Delivery   Birth date/time: 04/16/2022 00:31:00 Delivery type: Vaginal, Spontaneous     Baby name: Jasmine Bullock  Delivering provider: Noland Bullock   Lacerations:  None  Circumcision: Yes, planning  Feeding: breast and bottle  Pain control at delivery: Epidural   S:  Reports feeling well this morning. Has not had a BM and is worried. Son, age 41, had emergency appendectomy yesterday and is admitted on peds. Doing well. Husband is with son. Reports high stress due to long IOL and son being sick. Good family support.              Tolerating PO/No nausea or vomiting             Bleeding is light             Pain controlled with acetaminophen and ibuprofen (OTC)             Up ad lib/ambulatory/voiding without difficulties   O:  A & O x 3, in no apparent distress  Vitals:   04/16/22 1332 04/16/22 1713 04/16/22 2350 04/17/22 0500  BP: (!) 100/50 104/69 100/70 98/65  Pulse: 73 73 70 73  Resp: 20 20 18 18   Temp: 98.4 F (36.9 C)  98.2 F (36.8 C) 98.1 F (36.7 C)  TempSrc: Oral  Oral Oral  SpO2:   100% 99%  Weight:      Height:       Recent Labs    04/16/22 1142 04/17/22 0446  WBC 23.9* 14.8*  HGB 10.3* 8.2*  HCT 29.3* 23.0*  PLT 200 161    Blood type: --/--/O POS (08/04 12-01-2004)  Rubella: Immune (01/16 0000)   I&O: I/O last 3 completed shifts: In: -  Out: 2773 [Urine:1400; Blood:1373]          No intake/output data recorded.  Gen: AAO x 3, NAD Abdomen: soft, non-tender, non-distended Fundus: firm, non-tender, U-2 Perineum: intact Lochia: small Extremities: no edema, no calf pain or tenderness   A/P:  PPD # 1 41 y.o., 2774  Principal Problem:   Postpartum care following vaginal delivery 8/6  Doing well - stable status  Routine post partum orders Active Problems:   GAD (generalized anxiety disorder)  Continue Celexa 10mg  daily  Close PP F/U for mood  Son had  emergency appendectomy yesterday   Encounter for induction of labor   SVD 8/6   Postpartum hemorrhage 1348cc  Stable, bleeding minimal  Asymptomatic   Anemia associated with acute blood loss  Hgb 8.2  Will give IV Venofer today  Start PO Niferex and mag oxide tomorrow  Anticipate discharge tomorrow.   , MSN, CNM 04/17/2022, 10:23 AM

## 2022-04-19 ENCOUNTER — Inpatient Hospital Stay (HOSPITAL_BASED_OUTPATIENT_CLINIC_OR_DEPARTMENT_OTHER): Payer: 59

## 2022-04-19 ENCOUNTER — Inpatient Hospital Stay (HOSPITAL_COMMUNITY)
Admission: AD | Admit: 2022-04-19 | Discharge: 2022-04-19 | Disposition: A | Discharge status: Home / Self Care | Payer: 59 | Attending: Obstetrics and Gynecology | Admitting: Obstetrics and Gynecology

## 2022-04-19 ENCOUNTER — Encounter (HOSPITAL_COMMUNITY): Payer: Self-pay | Admitting: Obstetrics and Gynecology

## 2022-04-19 DIAGNOSIS — M79602 Pain in left arm: Secondary | ICD-10-CM

## 2022-04-19 DIAGNOSIS — O9089 Other complications of the puerperium, not elsewhere classified: Secondary | ICD-10-CM

## 2022-04-19 DIAGNOSIS — I82612 Acute embolism and thrombosis of superficial veins of left upper extremity: Secondary | ICD-10-CM | POA: Insufficient documentation

## 2022-04-19 DIAGNOSIS — I82619 Acute embolism and thrombosis of superficial veins of unspecified upper extremity: Secondary | ICD-10-CM | POA: Insufficient documentation

## 2022-04-19 HISTORY — DX: Acute embolism and thrombosis of superficial veins of unspecified upper extremity: I82.619

## 2022-04-19 LAB — CBC WITH DIFFERENTIAL/PLATELET
Abs Immature Granulocytes: 0.54 10*3/uL — ABNORMAL HIGH (ref 0.00–0.07)
Basophils Absolute: 0.1 10*3/uL (ref 0.0–0.1)
Basophils Relative: 1 %
Eosinophils Absolute: 0.4 10*3/uL (ref 0.0–0.5)
Eosinophils Relative: 2 %
HCT: 28 % — ABNORMAL LOW (ref 36.0–46.0)
Hemoglobin: 9.8 g/dL — ABNORMAL LOW (ref 12.0–15.0)
Immature Granulocytes: 4 %
Lymphocytes Relative: 15 %
Lymphs Abs: 2.2 10*3/uL (ref 0.7–4.0)
MCH: 34.4 pg — ABNORMAL HIGH (ref 26.0–34.0)
MCHC: 35 g/dL (ref 30.0–36.0)
MCV: 98.2 fL (ref 80.0–100.0)
Monocytes Absolute: 0.9 10*3/uL (ref 0.1–1.0)
Monocytes Relative: 6 %
Neutro Abs: 10.3 10*3/uL — ABNORMAL HIGH (ref 1.7–7.7)
Neutrophils Relative %: 72 %
Platelets: 268 10*3/uL (ref 150–400)
RBC: 2.85 MIL/uL — ABNORMAL LOW (ref 3.87–5.11)
RDW: 13.6 % (ref 11.5–15.5)
WBC: 14.3 10*3/uL — ABNORMAL HIGH (ref 4.0–10.5)
nRBC: 0 % (ref 0.0–0.2)

## 2022-04-19 MED ORDER — ENOXAPARIN SODIUM 100 MG/ML IJ SOSY
90.0000 mg | PREFILLED_SYRINGE | Freq: Once | INTRAMUSCULAR | Status: AC
  Administered 2022-04-19: 90 mg via SUBCUTANEOUS
  Filled 2022-04-19: qty 0.9

## 2022-04-19 MED ORDER — ENOXAPARIN SODIUM 40 MG/0.4ML IJ SOSY: 1.0000 mg/kg | PREFILLED_SYRINGE | Freq: Two times a day (BID) | INTRAMUSCULAR | 0 refills | Status: DC

## 2022-04-19 MED ORDER — ENOXAPARIN (LOVENOX) PATIENT EDUCATION KIT: 1.0000 | PACK | Freq: Once | 0 refills | Status: AC

## 2022-04-19 NOTE — MAU Provider Note (Signed)
MAU Provider Note  History  [id number]  Arrival date and time: 04/19/22 1713   Chief Complaint  Patient presents with   Arm Swelling     HPI Jasmine Bullock is a 41 y.o. G4P2 PPD # 4 with PMHx notable for GAD who presents for LUE pain and swelling. Pt was at peds visit and was told by one of the nurses that her left upper extremity forearm was swollen and erythematous, tender to touch. Pt mentioned that there were IVs in the same area when she was admitted. No fevers, chills, shortness of breath, lower extremity swelling.   Vaginal bleeding: No LOF: No Fetal Movement: No Contractions: No  --/--/O POS (08/04 6294)  OB History     Gravida  4   Para  2   Term  2   Preterm      AB  2   Living  2      SAB  1   IAB      Ectopic  1   Multiple  0   Live Births  2           Past Medical History:  Diagnosis Date   Allergy    Anxiety    Asthma    childhood   Depression    Female infertility, secondary 06/19/2019   GERD (gastroesophageal reflux disease) 01/28/2020   Plantar fasciitis    left foot    Past Surgical History:  Procedure Laterality Date   L wrist surgery     PLANTAR FASCIA RELEASE Left 01/27/2020   WISDOM TOOTH EXTRACTION      Family History  Problem Relation Age of Onset   Hypertension Mother    Ulcers Father    Healthy Sister    Ovarian cancer Maternal Grandmother    Alcohol abuse Maternal Grandfather    Hypertension Paternal Grandmother    Hyperlipidemia Paternal Grandmother    Diabetes Paternal Grandfather    Heart disease Paternal Grandfather    Hearing loss Paternal Grandfather    Hypertension Paternal Grandfather    Stroke Paternal Grandfather    Healthy Son    Colon cancer Neg Hx    Rectal cancer Neg Hx    Esophageal cancer Neg Hx    Breast cancer Neg Hx     Social History   Socioeconomic History   Marital status: Married    Spouse name: Not on file   Number of children: 1   Years of education: Not on file    Highest education level: Not on file  Occupational History   Occupation: dental hygentist   Tobacco Use   Smoking status: Never   Smokeless tobacco: Never  Vaping Use   Vaping Use: Never used  Substance and Sexual Activity   Alcohol use: Not Currently    Alcohol/week: 3.0 standard drinks of alcohol    Types: 3 Standard drinks or equivalent per week    Comment: a glass of wine nightly   Drug use: No   Sexual activity: Not Currently    Partners: Male    Birth control/protection: None    Comment: infertility  Other Topics Concern   Not on file  Social History Narrative   Marital status: Married since 2009; no abuse; happily married      Children: son;       Lives: with husband, son      Employment: Copywriter, advertising; 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      Tobacco: none      Alcohol:  weekends; one glass of wine per night.      Exercise:  3-4 days per week; running 3-4 miles      Seatbelt: 100%; no texting   Social Determinants of Health   Financial Resource Strain: Not on file  Food Insecurity: Not on file  Transportation Needs: Not on file  Physical Activity: Not on file  Stress: Not on file  Social Connections: Not on file  Intimate Partner Violence: Not on file    No Known Allergies  No current facility-administered medications on file prior to encounter.   Current Outpatient Medications on File Prior to Encounter  Medication Sig Dispense Refill   citalopram (CELEXA) 20 MG tablet TAKE 1 TABLET(20 MG) BY MOUTH DAILY 90 tablet 3   multivitamin prental (TRINATAL) 60-1 MG TABS tablet Take by mouth.     pantoprazole (PROTONIX) 20 MG tablet Take 20 mg by mouth daily.     acetaminophen (TYLENOL) 325 MG tablet Take 2 tablets (650 mg total) by mouth every 4 (four) hours as needed (for pain scale < 4). 30 tablet 1   ibuprofen (ADVIL) 600 MG tablet Take 1 tablet (600 mg total) by mouth every 6 (six) hours. 30 tablet 0   iron polysaccharides (FERREX 150) 150 MG capsule Take 1 capsule (150 mg  total) by mouth daily. 30 capsule 1   magnesium oxide (MAG-OX) 400 (240 Mg) MG tablet Take 1 tablet (400 mg total) by mouth daily. 30 tablet 1   Pertinent positives and negative per HPI, all others reviewed and negative  Physical Exam   BP 114/72 (BP Location: Right Arm)   Pulse 90   Temp 98.4 F (36.9 C) (Oral)   Resp 14   Ht _0  (1.702 m)   Wt 92.4 kg   SpO2 100%   Breastfeeding Yes   BMI 31.89 kg/m   Patient Vitals for the past 24 hrs:  BP Temp Temp src Pulse Resp SpO2 Height Weight  04/19/22 1759 114/72 98.4 F (36.9 C) Oral 90 14 100 % -- --  04/19/22 1753 -- -- -- -- -- -- _1  (1.702 m) 92.4 kg    Physical Exam Vitals reviewed.  Constitutional:      Appearance: Normal appearance.  HENT:     Head: Normocephalic and atraumatic.     Right Ear: External ear normal.     Left Ear: External ear normal.     Nose: Nose normal.     Mouth/Throat:     Mouth: Mucous membranes are moist.     Pharynx: Oropharynx is clear.  Eyes:     Pupils: Pupils are equal, round, and reactive to light.  Cardiovascular:     Rate and Rhythm: Normal rate and regular rhythm.  Pulmonary:     Effort: Pulmonary effort is normal.     Breath sounds: Normal breath sounds.  Abdominal:     General: Abdomen is flat.     Palpations: Abdomen is soft.  Musculoskeletal:     Comments: Left posterior forearm swollen from wrist to mid forearm, 2 IV sites noted around erythema and induration.  Induration is not tubular, more round.  Tender to palpation.  Skin:    General: Skin is warm.     Capillary Refill: Capillary refill takes less than 2 seconds.  Neurological:     Mental Status: She is oriented to person, place, and time.  Psychiatric:        Mood and Affect: Mood normal.     Cervical Exam  Labs Results for orders placed or performed during the hospital encounter of 04/19/22 (from the past 24 hour(s))  CBC with Differential/Platelet     Status: Abnormal   Collection Time: 04/19/22   6:48 PM  Result Value Ref Range   WBC 14.3 (H) 4.0 - 10.5 K/uL   RBC 2.85 (L) 3.87 - 5.11 MIL/uL   Hemoglobin 9.8 (L) 12.0 - 15.0 g/dL   HCT 28.0 (L) 36.0 - 46.0 %   MCV 98.2 80.0 - 100.0 fL   MCH 34.4 (H) 26.0 - 34.0 pg   MCHC 35.0 30.0 - 36.0 g/dL   RDW 13.6 11.5 - 15.5 %   Platelets 268 150 - 400 K/uL   nRBC 0.0 0.0 - 0.2 %   Neutrophils Relative % 72 %   Neutro Abs 10.3 (H) 1.7 - 7.7 K/uL   Lymphocytes Relative 15 %   Lymphs Abs 2.2 0.7 - 4.0 K/uL   Monocytes Relative 6 %   Monocytes Absolute 0.9 0.1 - 1.0 K/uL   Eosinophils Relative 2 %   Eosinophils Absolute 0.4 0.0 - 0.5 K/uL   Basophils Relative 1 %   Basophils Absolute 0.1 0.0 - 0.1 K/uL   Immature Granulocytes 4 %   Abs Immature Granulocytes 0.54 (H) 0.00 - 0.07 K/uL    Imaging VAS [place] UPPER EXTREMITY VENOUS DUPLEX  Result Date: 04/19/2022 UPPER VENOUS STUDY  Patient Name:  Jasmine Bullock  Date of Exam:   04/19/2022 Medical Rec #: [id number]       Accession #:    [id number] Date of Birth: 1981-07-31       Patient Gender: F Patient Age:   26 years Exam Location:  [facility] Procedure:      VAS [place] UPPER EXTREMITY VENOUS DUPLEX Referring Phys: Amiayah Giebel MERCADO-ORTIZ --------------------------------------------------------------------------------  Indications: Pain, swelling, and erythema of left arm s/p IV Comparison Study: No prior studies. Performing Technologist: Darlin Coco RDMS, RVT  Examination Guidelines: A complete evaluation includes B-mode imaging, spectral Doppler, color Doppler, and power Doppler as needed of all accessible portions of each vessel. Bilateral testing is considered an integral part of a complete examination. Limited examinations for reoccurring indications may be performed as noted.  Right Findings: +----------+------------+---------+-----------+----------+-------+ RIGHT     CompressiblePhasicitySpontaneousPropertiesSummary +----------+------------+---------+-----------+----------+-------+  Subclavian               Yes       Yes                      +----------+------------+---------+-----------+----------+-------+  Left Findings: +----------+------------+---------+-----------+-----------------+-------+ LEFT      CompressiblePhasicitySpontaneous   Properties    Summary +----------+------------+---------+-----------+-----------------+-------+ IJV           Full       Yes       Yes                             +----------+------------+---------+-----------+-----------------+-------+ Subclavian               Yes       Yes                             +----------+------------+---------+-----------+-----------------+-------+ Axillary      Full       Yes       Yes                             +----------+------------+---------+-----------+-----------------+-------+  Brachial      Full                                                 +----------+------------+---------+-----------+-----------------+-------+ Radial        Full                                                 +----------+------------+---------+-----------+-----------------+-------+ Ulnar         Full                                                 +----------+------------+---------+-----------+-----------------+-------+ Cephalic      Full                                                 +----------+------------+---------+-----------+-----------------+-------+ Basilic       None       No        No     AC to mid forearm Acute  +----------+------------+---------+-----------+-----------------+-------+  Summary:  Right: No evidence of thrombosis in the subclavian.  Left: No evidence of deep vein thrombosis in the upper extremity. Findings consistent with acute superficial vein thrombosis involving the left basilic vein.  *See table(s) above for measurements and observations.    Preliminary     MAU Course  Procedures Lab Orders         CBC with Differential/Platelet     Meds ordered  this encounter  Medications   enoxaparin (LOVENOX) injection 90 mg   enoxaparin (LOVENOX) 40 MG/0.4ML injection    Sig: Inject 0.925 mLs (92.5 mg total) into the skin every 12 (twelve) hours.    Dispense:  754.8 mL    Refill:  0   enoxaparin (LOVENOX) KIT    Sig: 1 kit by Does not apply route once for 1 dose.    Dispense:  1 kit    Refill:  0   Imaging Orders         VAS [place] UPPER EXTREMITY VENOUS DUPLEX     MDM moderate  Assessment and Plan  Superficial Thrombus- L basilic vein Left upper extremity swelling  Patient present for left upper extremity swelling.  She is postpartum day 4.  Swelling noted to be around prior IV sites.  Noted erythema, swelling, induration, tenderness to palpation.  No associated fevers, chills.  Differential included cellulitis versus thrombophlebitis versus thrombus.  Obtained left upper extremity duplex which showed basilic vein thrombus on the left side from [facility] to mid forearm.  Started Lovenox 1 mg/kg every 12 hours.  Will continue this for the next 6 weeks.  At home, continue warm compresses and Tylenol for pain.  Gave return precautions.  Follow-up with primary OB within 1 week. Dispo: discharged to home in stable condition.  Discharge Instructions     Discharge patient   Complete by: As directed    Discharge disposition: 01-Home or Self Care   Discharge patient date: 04/19/2022  Allergies as of 04/19/2022   No Known Allergies      Medication List     TAKE these medications    acetaminophen 325 MG tablet Commonly known as: Tylenol Take 2 tablets (650 mg total) by mouth every 4 (four) hours as needed (for pain scale < 4).   citalopram 20 MG tablet Commonly known as: CELEXA TAKE 1 TABLET(20 MG) BY MOUTH DAILY   enoxaparin 40 MG/0.4ML injection Commonly known as: LOVENOX Inject 0.925 mLs (92.5 mg total) into the skin every 12 (twelve) hours.   enoxaparin Kit Commonly known as: LOVENOX 1 kit by Does not apply route once for 1  dose.   ibuprofen 600 MG tablet Commonly known as: ADVIL Take 1 tablet (600 mg total) by mouth every 6 (six) hours.   iron polysaccharides 150 MG capsule Commonly known as: Ferrex 150 Take 1 capsule (150 mg total) by mouth daily.   magnesium oxide 400 (240 Mg) MG tablet Commonly known as: MAG-OX Take 1 tablet (400 mg total) by mouth daily.   multivitamin prental 60-1 MG Tabs tablet Take by mouth.   pantoprazole 20 MG tablet Commonly known as: PROTONIX Take 20 mg by mouth daily.        Shelda Pal, [place] Fellow, Faculty practice [place] for [facility] 04/19/22  8:54 PM

## 2022-04-19 NOTE — MAU Note (Signed)
...  Jasmine Bullock is a 41 y.o. at 3 days post partum vaginal delivery here in MAU reporting: Left posterior forearm swelling at the IV site that is warm and hard to the touch. She reports she was discharged this past Monday. She reports she also received an iron infusion prior to discharge. She reports her IV was uncomfortable the entire time that it was placed but she thought this was normal.   Pain score: 1/10 arm - 9/10 with palpation.

## 2022-04-19 NOTE — MAU Note (Signed)
RN attempted to reach [facility] Lab Services for venous US at [phone]. Number went to pre-recorded voicemail. RN contacted operator, who is going to have the Vascular team call MAU back.

## 2022-04-19 NOTE — Progress Notes (Signed)
Upper extremity venous left study completed.  Preliminary results relayed to provider in MAU.  See CV Proc for preliminary results report.   Jean Rosenthal, RDMS, RVT

## 2022-04-20 ENCOUNTER — Other Ambulatory Visit (HOSPITAL_BASED_OUTPATIENT_CLINIC_OR_DEPARTMENT_OTHER): Payer: Self-pay | Admitting: Advanced Practice Midwife

## 2022-04-20 DIAGNOSIS — I82612 Acute embolism and thrombosis of superficial veins of left upper extremity: Secondary | ICD-10-CM

## 2022-04-20 MED ORDER — ENOXAPARIN SODIUM 100 MG/ML IJ SOSY: 100.0000 mg | PREFILLED_SYRINGE | Freq: Two times a day (BID) | INTRAMUSCULAR | 0 refills | Status: DC

## 2022-04-22 ENCOUNTER — Inpatient Hospital Stay (HOSPITAL_COMMUNITY)
Admission: AD | Admit: 2022-04-22 | Discharge: 2022-04-22 | Disposition: A | Discharge status: Home / Self Care | Payer: 59 | Attending: Obstetrics & Gynecology | Admitting: Obstetrics & Gynecology

## 2022-04-22 DIAGNOSIS — O909 Complication of the puerperium, unspecified: Secondary | ICD-10-CM | POA: Diagnosis present

## 2022-04-22 DIAGNOSIS — M79603 Pain in arm, unspecified: Secondary | ICD-10-CM | POA: Diagnosis not present

## 2022-04-22 DIAGNOSIS — I82890 Acute embolism and thrombosis of other specified veins: Secondary | ICD-10-CM | POA: Diagnosis not present

## 2022-04-22 NOTE — MAU Provider Note (Signed)
History     CSN: [id number]  Arrival date and time: 04/22/22 2003   Event Date/Time   First Provider Initiated Contact with Patient 04/22/22 2026      No chief complaint on file.  Jasmine Bullock is a 41 y.o. [id number] at 6 Days postpartum who receives care at [facility] She does not have a visit scheduled with her primary OB.  She presents today for worsening of arm pain.  She endorses that she was diagnosed with a blood clot " a few days ago" and is taking Lovenox as prescribed.  However, she reports the pain is getting worse, the area is getting redder and more swollen.  She reports taking tylenol (11am) and applying warm compresses with some relief. She states that once the medication wears off, the pain is more "painful than it was." She denies decreased ROM or issues with grip.     OB History     Gravida  4   Para  2   Term  2   Preterm      AB  2   Living  2      SAB  1   IAB      Ectopic  1   Multiple  0   Live Births  2           Past Medical History:  Diagnosis Date   Allergy    Anxiety    Asthma    childhood   Depression    Female infertility, secondary 06/19/2019   GERD (gastroesophageal reflux disease) 01/28/2020   Plantar fasciitis    left foot    Past Surgical History:  Procedure Laterality Date   L wrist surgery     PLANTAR FASCIA RELEASE Left 01/27/2020   WISDOM TOOTH EXTRACTION      Family History  Problem Relation Age of Onset   Hypertension Mother    Ulcers Father    Healthy Sister    Ovarian cancer Maternal Grandmother    Alcohol abuse Maternal Grandfather    Hypertension Paternal Grandmother    Hyperlipidemia Paternal Grandmother    Diabetes Paternal Grandfather    Heart disease Paternal Grandfather    Hearing loss Paternal Grandfather    Hypertension Paternal Grandfather    Stroke Paternal Grandfather    Healthy Son    Colon cancer Neg Hx    Rectal cancer Neg Hx    Esophageal cancer Neg Hx    Breast cancer  Neg Hx     Social History   Tobacco Use   Smoking status: Never   Smokeless tobacco: Never  Vaping Use   Vaping Use: Never used  Substance Use Topics   Alcohol use: Not Currently    Alcohol/week: 3.0 standard drinks of alcohol    Types: 3 Standard drinks or equivalent per week    Comment: a glass of wine nightly   Drug use: No    Allergies: No Known Allergies  Medications Prior to Admission  Medication Sig Dispense Refill Last Dose   acetaminophen (TYLENOL) 325 MG tablet Take 2 tablets (650 mg total) by mouth every 4 (four) hours as needed (for pain scale < 4). 30 tablet 1    citalopram (CELEXA) 20 MG tablet TAKE 1 TABLET(20 MG) BY MOUTH DAILY 90 tablet 3    enoxaparin (LOVENOX) 100 MG/ML injection Inject 1 mL (100 mg total) into the skin every 12 (twelve) hours. 84 mL 0    ibuprofen (ADVIL) 600 MG tablet Take 1  tablet (600 mg total) by mouth every 6 (six) hours. 30 tablet 0    iron polysaccharides (FERREX 150) 150 MG capsule Take 1 capsule (150 mg total) by mouth daily. 30 capsule 1    magnesium oxide (MAG-OX) 400 (240 Mg) MG tablet Take 1 tablet (400 mg total) by mouth daily. 30 tablet 1    multivitamin prental (TRINATAL) 60-1 MG TABS tablet Take by mouth.      pantoprazole (PROTONIX) 20 MG tablet Take 20 mg by mouth daily.       Review of Systems  Constitutional:  Negative for chills and fever.  Eyes:  Negative for visual disturbance.  Respiratory:  Negative for cough and shortness of breath.   Cardiovascular:  Negative for chest pain.  Musculoskeletal:        Soreness around left side collar bone  Neurological:  Negative for dizziness, light-headedness and headaches.   Physical Exam   Blood pressure 121/77, pulse 66, temperature 98.5 F (36.9 C), temperature source Oral, resp. rate 15, height 5\' 7"  (1.702 m), weight 89.1 kg, SpO2 100 %, currently breastfeeding.  Physical Exam  MAU Course  Procedures No results found for this or any previous visit (from the past  24 hour(s)).  MDM ***  Assessment and Plan  *** year old, G***P***  SIUP at ***weeks ***  -Reviewed POC with patient. -Exam performed and findings discussed.  -*** -*** -***   04/22/2022, 8:26 PM

## 2022-04-22 NOTE — MAU Note (Signed)
..  Jasmine Bullock is a 41 y.o. at [facility] from vaginal delivery on 04/16/2022 here in MAU reporting: Is at hospital b/c older son has had surgeries and she noticed that her arm where her IV was is getting worse.  Was here other day for the same reason, had an [place] and was given oral medication but it seems to be getting worse. The site is red, tender, and warm to the touch.   Pain score: 6/10 Vitals:   04/22/22 2016  BP: 121/77  Pulse: 66  Resp: 15  Temp: 98.5 F (36.9 C)  SpO2: 100%

## 2022-04-25 ENCOUNTER — Telehealth (HOSPITAL_COMMUNITY): Payer: Self-pay

## 2022-04-25 NOTE — Telephone Encounter (Signed)
Patient did not answer phone call. Voicemail left for patient.   Jasmine Bullock [facility] 04/25/2022,1434

## 2022-06-05 ENCOUNTER — Encounter: Payer: Self-pay | Admitting: *Deleted

## 2022-06-08 ENCOUNTER — Ambulatory Visit: Payer: 59

## 2022-06-09 ENCOUNTER — Ambulatory Visit (INDEPENDENT_AMBULATORY_CARE_PROVIDER_SITE_OTHER): Payer: 59 | Admitting: *Deleted

## 2022-06-09 DIAGNOSIS — Z23 Encounter for immunization: Secondary | ICD-10-CM | POA: Diagnosis not present

## 2022-08-18 ENCOUNTER — Encounter: Payer: Self-pay | Admitting: Family Medicine

## 2022-08-18 ENCOUNTER — Ambulatory Visit (INDEPENDENT_AMBULATORY_CARE_PROVIDER_SITE_OTHER): Payer: 59 | Admitting: Family Medicine

## 2022-08-18 VITALS — BP 96/60 | HR 67 | Temp 98.0°F | Ht 67.0 in | Wt 190.8 lb

## 2022-08-18 DIAGNOSIS — Z0000 Encounter for general adult medical examination without abnormal findings: Secondary | ICD-10-CM | POA: Diagnosis not present

## 2022-08-18 DIAGNOSIS — Z136 Encounter for screening for cardiovascular disorders: Secondary | ICD-10-CM | POA: Diagnosis not present

## 2022-08-18 DIAGNOSIS — F411 Generalized anxiety disorder: Secondary | ICD-10-CM | POA: Diagnosis not present

## 2022-08-18 LAB — CBC WITH DIFFERENTIAL/PLATELET
Basophils Absolute: 0.1 10*3/uL (ref 0.0–0.1)
Basophils Relative: 0.6 % (ref 0.0–3.0)
Eosinophils Absolute: 0.3 10*3/uL (ref 0.0–0.7)
Eosinophils Relative: 3.9 % (ref 0.0–5.0)
HCT: 42.5 % (ref 36.0–46.0)
Hemoglobin: 14.6 g/dL (ref 12.0–15.0)
Lymphocytes Relative: 27.8 % (ref 12.0–46.0)
Lymphs Abs: 2.2 10*3/uL (ref 0.7–4.0)
MCHC: 34.4 g/dL (ref 30.0–36.0)
MCV: 90.3 fl (ref 78.0–100.0)
Monocytes Absolute: 0.5 10*3/uL (ref 0.1–1.0)
Monocytes Relative: 6.3 % (ref 3.0–12.0)
Neutro Abs: 5 10*3/uL (ref 1.4–7.7)
Neutrophils Relative %: 61.4 % (ref 43.0–77.0)
Platelets: 258 10*3/uL (ref 150.0–400.0)
RBC: 4.71 Mil/uL (ref 3.87–5.11)
RDW: 14.6 % (ref 11.5–15.5)
WBC: 8.1 10*3/uL (ref 4.0–10.5)

## 2022-08-18 LAB — TSH: TSH: 1.49 u[IU]/mL (ref 0.35–5.50)

## 2022-08-18 LAB — COMPREHENSIVE METABOLIC PANEL
ALT: 10 U/L (ref 0–35)
AST: 15 U/L (ref 0–37)
Albumin: 4.3 g/dL (ref 3.5–5.2)
Alkaline Phosphatase: 53 U/L (ref 39–117)
BUN: 13 mg/dL (ref 6–23)
CO2: 25 mEq/L (ref 19–32)
Calcium: 9.1 mg/dL (ref 8.4–10.5)
Chloride: 106 mEq/L (ref 96–112)
Creatinine, Ser: 0.78 mg/dL (ref 0.40–1.20)
GFR: 94.3 mL/min (ref >60.00)
Glucose, Bld: 88 mg/dL (ref 70–99)
Potassium: 4.2 mEq/L (ref 3.5–5.1)
Sodium: 138 mEq/L (ref 135–145)
Total Bilirubin: 0.5 mg/dL (ref 0.2–1.2)
Total Protein: 7 g/dL (ref 6.0–8.3)

## 2022-08-18 LAB — LIPID PANEL
Cholesterol: 195 mg/dL (ref 0–200)
HDL: 60.9 mg/dL (ref >39.00)
LDL Cholesterol: 118 mg/dL — ABNORMAL HIGH (ref 0–99)
NonHDL: 133.97
Total CHOL/HDL Ratio: 3
Triglycerides: 80 mg/dL (ref 0.0–149.0)
VLDL: 16 mg/dL (ref 0.0–40.0)

## 2022-08-18 NOTE — Progress Notes (Signed)
Subjective  Chief Complaint  Patient presents with   Annual Exam    Pt here for Annual exam and is currently fasting     HPI: Jasmine Bullock is a 41 y.o. female who presents to [facility] [place] today for a Female Wellness Visit. She also has the concerns and/or needs as listed above in the chief complaint. These will be addressed in addition to the Health Maintenance Visit.   Wellness Visit: annual visit with health maintenance review and exam without Pap  Health maintenance: 41 year old G4 P2-0-2-2 status post inducted vaginal delivery in August, healthy son.  Doing well overall.  She did have some postpartum depression and complications and her older son had an emergent appendectomy so Celexa was increased from 20-40 and she has tolerated this change well.  Now doing well with the stress anxiety and mood.  Healthy lifestyle.  Just quit breast-feeding.  Has been on iron to treat her iron deficiency anemia.  Immunizations are current. Chronic disease f/u and/or acute problem visit: (deemed necessary to be done in addition to the wellness visit): Anxiety disorder: Now controlled as above  Assessment  1. Annual physical exam   2. GAD (generalized anxiety disorder)   3. SVD 8/6      Plan  Female Wellness Visit: Age appropriate Health Maintenance and Prevention measures were discussed with patient. Included topics are cancer screening recommendations, ways to keep healthy (see AVS) including dietary and exercise recommendations, regular eye and dental care, use of seat belts, and avoidance of moderate alcohol use and tobacco use.  BMI: discussed patient's BMI and encouraged positive lifestyle modifications to help get to or maintain a target BMI. HM needs and immunizations were addressed and ordered. See below for orders. See HM and immunization section for updates. Routine labs and screening tests ordered including cmp, cbc and lipids where appropriate. Discussed  recommendations regarding Vit D and calcium supplementation (see AVS)  Chronic disease management visit and/or acute problem visit: Continue Celexa for anxiety.  She will complete 6 months of 40 mg daily soon and that will try to go back to 20 mg. Follow up: Return in about 1 year (around 08/19/2023) for complete physical.  Orders Placed This Encounter  Procedures   CBC with Differential/Platelet   Comprehensive metabolic panel   Lipid panel   TSH   No orders of the defined types were placed in this encounter.     Body mass index is 29.88 kg/m. Wt Readings from Last 3 Encounters:  08/18/22 190 lb 12.8 oz (86.5 kg)  04/22/22 196 lb 6.4 oz (89.1 kg)  04/19/22 203 lb 9.6 oz (92.4 kg)     Patient Active Problem List   Diagnosis Date Noted   GAD (generalized anxiety disorder) 01/28/2020    Priority: High    Diagnosed initially in early 20s; has been well controlled on celexa and prn xanax. Has had panic attacks. Mom and MGM have same.     SVD 8/6 04/16/2022   Health Maintenance  Topic Date Due   COVID-19 Vaccine (3 - 2023-24 season) 09/03/2022 (Originally 05/12/2022)   PAP SMEAR-Modifier  05/20/2024   DTaP/Tdap/Td (4 - Td or Tdap) 12/11/2031   INFLUENZA VACCINE  Completed   Hepatitis C Screening  Completed   HIV Screening  Completed   HPV VACCINES  Aged Out   Immunization History  Administered Date(s) Administered   Influenza,inj,Quad PF,6+ Mos 06/12/2014, 05/16/2016, 07/06/2017, 09/19/2018, 06/19/2019, 07/09/2020, 06/09/2022   Influenza-Unspecified 07/18/2021  PFIZER(Purple Top)SARS-COV-2 Vaccination 09/24/2019, 10/16/2019   PPD Test 10/27/2016   Tdap 05/26/2009, 09/11/2012, 12/10/2021   We updated and reviewed the patient's past history in detail and it is documented below. Allergies: Patient has No Known Allergies. Past Medical History Patient  has a past medical history of Allergy, Anxiety, Asthma, Depression, Female infertility, secondary (06/19/2019), GERD  (gastroesophageal reflux disease) (01/28/2020), Plantar fasciitis, and Postpartum hemorrhage 1348cc (04/16/2022). Past Surgical History Patient  has a past surgical history that includes L wrist surgery; Wisdom tooth extraction; and Plantar fascia release (Left, 01/27/2020). Family History: Patient family history includes Alcohol abuse in her maternal grandfather; Diabetes in her paternal grandfather; Healthy in her sister, son, and son; Hearing loss in her paternal grandfather; Heart disease in her paternal grandfather; Hyperlipidemia in her paternal grandmother; Hypertension in her mother, paternal grandfather, and paternal grandmother; Ovarian cancer in her maternal grandmother; Stroke in her paternal grandfather; Ulcers in her father. Social History:  Patient  reports that she has never smoked. She has never used smokeless tobacco. She reports that she does not currently use alcohol after a past usage of about 3.0 standard drinks of alcohol per week. She reports that she does not use drugs.  Review of Systems: Constitutional: negative for fever or malaise Ophthalmic: negative for photophobia, double vision or loss of vision Cardiovascular: negative for chest pain, dyspnea on exertion, or new LE swelling Respiratory: negative for SOB or persistent cough Gastrointestinal: negative for abdominal pain, change in bowel habits or melena Genitourinary: negative for dysuria or gross hematuria, no abnormal uterine bleeding or disharge Musculoskeletal: negative for new gait disturbance or muscular weakness Integumentary: negative for new or persistent rashes, no breast lumps Neurological: negative for TIA or stroke symptoms Psychiatric: negative for SI or delusions Allergic/Immunologic: negative for hives  Patient Care Team    Relationship Specialty Notifications Start End  Willow Ora, MD PCP - General Family Medicine  01/28/20   Tressia Danas, MD Consulting Physician Gastroenterology   01/28/20   Toni Arthurs, MD Consulting Physician Orthopedic Surgery  01/28/20   Noland Fordyce, MD Consulting Physician Obstetrics and Gynecology  01/28/20     Objective  Vitals: BP 96/60   Pulse 67   Temp 98 F (36.7 C)   Ht 5\' 7"  (1.702 m)   Wt 190 lb 12.8 oz (86.5 kg)   SpO2 98%   BMI 29.88 kg/m  General:  Well developed, well nourished, no acute distress  Psych:  Alert and orientedx3,normal mood and affect HEENT:  Normocephalic, atraumatic, non-icteric sclera,  supple neck without adenopathy, mass or thyromegaly Cardiovascular:  Normal S1, S2, RRR without gallop, rub or murmur Respiratory:  Good breath sounds bilaterally, CTAB with normal respiratory effort Gastrointestinal: normal bowel sounds, soft, non-tender, no noted masses. No HSM MSK: no deformities, contusions. Joints are without erythema or swelling.  Skin:  Warm, no rashes or suspicious lesions noted Neurologic:    Mental status is normal. CN 2-11 are normal. Gross motor and sensory exams are normal. Normal gait. No tremor   Commons side effects, risks, benefits, and alternatives for medications and treatment plan prescribed today were discussed, and the patient expressed understanding of the given instructions. Patient is instructed to call or message via MyChart if he/she has any questions or concerns regarding our treatment plan. No barriers to understanding were identified. We discussed Red Flag symptoms and signs in detail. Patient expressed understanding regarding what to do in case of urgent or emergency type symptoms.  Medication list was reconciled,  printed and provided to the patient in AVS. Patient instructions and summary information was reviewed with the patient as documented in the AVS. This note was prepared with assistance of Dragon voice recognition software. Occasional wrong-word or sound-a-like substitutions may have occurred due to the inherent limitations of voice recognition software

## 2022-08-18 NOTE — Patient Instructions (Signed)
Please return in 12 months for your annual complete physical; please come fasting.   I will release your lab results to you on your MyChart account with further instructions. You may see the results before I do, but when I review them I will send you a message with my report or have my assistant call you if things need to be discussed. Please reply to my message with any questions. Thank you!   Congratulations on the new baby! May you and your family have a great holiday season and year!  If you have any questions or concerns, please don't hesitate to send me a message via MyChart or call the office at [phone]. Thank you for visiting with [place] today! It's our pleasure caring for you.   Please do these things to maintain good health!  Exercise at least 30-45 minutes a day,  4-5 days a week.  Eat a low-fat diet with lots of fruits and vegetables, up to 7-9 servings per day. Drink plenty of water daily. Try to drink 8 8oz glasses per day. Seatbelts can save your life. Always wear your seatbelt. Place Smoke Detectors on every level of your home and check batteries every year. Schedule an appointment with an eye doctor for an eye exam every 1-2 years Safe sex - use condoms to protect yourself from STDs if you could be exposed to these types of infections. Use birth control if you do not want to become pregnant and are sexually active. Avoid heavy alcohol use. If you drink, keep it to less than 2 drinks/day and not every day. Health Care Power of Attorney.  Choose someone you trust that could speak for you if you became unable to speak for yourself. Depression is common in our stressful world.If you're feeling down or losing interest in things you normally enjoy, please come in for a visit. If anyone is threatening or hurting you, please get help. Physical or Emotional Violence is never OK.

## 2022-08-22 ENCOUNTER — Encounter: Payer: Self-pay | Admitting: Family Medicine

## 2022-09-12 ENCOUNTER — Telehealth: Payer: Self-pay | Admitting: Family Medicine

## 2022-09-12 NOTE — Telephone Encounter (Signed)
HomeCare Advised   Patient Name: Jasmine Bullock Gender: Female DOB: [birth year] Age: 42 Y 43 M 16 D Return Phone Number: [phone] (Primary), [phone] (Secondary) Address: City/ State/ Zip: [place]  [address] Client [place] [facility] [place] Client Site [place] [facility] [place] Provider Billey Chang- MD Contact Type Call Who Is Calling Patient / Member / Family / Caregiver Call Type Triage / Clinical Relationship To Patient Self Return Phone Number [phone] (Primary) Chief Complaint Headache Reason for Call Symptomatic / Request for [place] states she has tested positive for COVID. She is having headache, chills, and dry throat. Translation No Nurse Assessment Nurse: Mariel Sleet, RN, Erasmo Downer Date/Time (Eastern Time): 09/09/2022 2:05:56 PM Confirm and document reason for call. If symptomatic, describe symptoms. ---Caller states that she tested positive for covid this afternoon, symptoms for a couple days. Does the patient have any new or worsening symptoms? ---Yes Will a triage be completed? ---Yes Related visit to physician within the last 2 weeks? ---No Does the PT have any chronic conditions? (i.e. diabetes, asthma, this includes High risk factors for pregnancy, etc.) ---No Is the patient pregnant or possibly pregnant? (Ask all females between the ages of 74-55) ---No Is this a behavioral health or substance abuse call? ---No Guidelines Guideline Title Affirmed Question Affirmed Notes Nurse Date/Time (Eastern Time) COVID-19 - Diagnosed or Suspected [1] COVID-19 diagnosed by positive lab test (e.g., PCR, rapid self-test kit) AND [2] mild symptoms (e.g., cough, fever, others) AND [3] no Donadeo, RN, Erasmo Downer 09/09/2022 2:06:40 PM PLEASE NOTE: All timestamps contained within this report are represented as [place] Standard Time. CONFIDENTIALTY NOTICE: This fax  transmission is intended only for the addressee. It contains information that is legally privileged, confidential or otherwise protected from use or disclosure. If you are not the intended recipient, you are strictly prohibited from reviewing, disclosing, copying using or disseminating any of this information or taking any action in reliance on or regarding this information. If you have received this fax in error, please notify us immediately by telephone so that we can arrange for its return to [place]. Phone: [phone], Toll-Free: [phone], Fax: [phone] Page: 2 of 2 Call Id: [id number] Guidelines Guideline Title Affirmed Question Affirmed Notes Nurse Date/Time Eilene Ghazi Time) complications or SOB Disp. Time Eilene Ghazi Time) Disposition Final User 09/09/2022 2:11:10 PM Home Care Yes Mariel Sleet, RN, Erasmo Downer Final Disposition 09/09/2022 2:11:10 PM Home Care Yes Mariel Sleet, RN, Laurin Coder Disagree/Comply Comply Caller Understands Yes PreDisposition Call Doctor Care Advice Given Per Guideline HOME CARE: * You should be able to treat this at home. PAIN AND FEVER MEDICINES - EXTRA NOTES AND WARNINGS: * Acetaminophen is thought to be safer than ibuprofen or naproxen in people over 68 years old. Acetaminophen is in many OTC and prescription medicines. It might be in more than one medicine that you are taking. You need to be careful and not take an overdose. An acetaminophen overdose can hurt the liver. CALL BACK IF: * Fever over 103 F (39.4 C) * Chest pain or difficulty breathing occurs * You become worse CARE ADVICE given per COVID-19 - DIAGNOSED OR SUSPECTED (Adult) guideline

## 2022-10-05 ENCOUNTER — Telehealth: Payer: Self-pay | Admitting: Family Medicine

## 2022-10-05 ENCOUNTER — Ambulatory Visit: Payer: 59 | Admitting: Family Medicine

## 2022-10-05 ENCOUNTER — Encounter: Payer: Self-pay | Admitting: Family Medicine

## 2022-10-05 VITALS — BP 87/51 | HR 57 | Temp 97.5°F | Ht 67.0 in | Wt 182.0 lb

## 2022-10-05 DIAGNOSIS — M545 Low back pain, unspecified: Secondary | ICD-10-CM | POA: Diagnosis not present

## 2022-10-05 LAB — POCT URINALYSIS DIPSTICK
Bilirubin, UA: NEGATIVE
Glucose, UA: NEGATIVE
Ketones, UA: NEGATIVE
Nitrite, UA: NEGATIVE
Protein, UA: NEGATIVE
Spec Grav, UA: <=1.005 — AB (ref 1.010–1.025)
Urobilinogen, UA: 0.2 E.U./dL
pH, UA: 6.5 (ref 5.0–8.0)

## 2022-10-05 MED ORDER — FLUCONAZOLE 150 MG PO TABS: 150.0000 mg | ORAL_TABLET | Freq: Once | ORAL | 0 refills | Status: AC

## 2022-10-05 MED ORDER — CEPHALEXIN 500 MG PO CAPS: 500.0000 mg | ORAL_CAPSULE | Freq: Two times a day (BID) | ORAL | 0 refills | Status: AC

## 2022-10-05 MED ORDER — TAMSULOSIN HCL 0.4 MG PO CAPS: 0.4000 mg | ORAL_CAPSULE | Freq: Every day | ORAL | 3 refills | Status: DC

## 2022-10-05 MED ORDER — CEPHALEXIN 500 MG PO CAPS: 500.0000 mg | ORAL_CAPSULE | Freq: Three times a day (TID) | ORAL | 0 refills | Status: DC

## 2022-10-05 NOTE — Assessment & Plan Note (Signed)
Differential diagnosis:  - Urinary Tract Infection (UTI): The presence of white blood cells in urine and the patient's historical experience of UTIs support this diagnosis. - Nephrolithiasis (Kidney stones): The acute, severe nature of the pain and the presence of blood in the urine without notable urinary symptoms are suggestive of renal calculi. The patient's symptoms improving could indicate the stone may have already passed.  Plan:  - Begin empiric antibiotic treatment with cephalexin 500mg  twice daily for 7 days while awaiting urine culture (to treat a possible UTI or pyelonephritis). - Instruct the patient to hydrate aggressively to aid in flushing out any possible stones and to potentially alleviate symptoms. - Monitor symptoms and encourage non-caffeinated fluid intake.  - Recommend pain management with Ibuprofen as needed. - Start Tamsulosin if urine culture is negative and symptoms do not improve, to facilitate the passage of any remaining stones. - Re-evaluate in a week or sooner if the condition worsens, with consideration for a CT scan particularly if the patient becomes acutely unwell or the pain intensifies, suggesting obstructive uropathy from a possible kidney stone.

## 2022-10-05 NOTE — Telephone Encounter (Signed)
Patient states: -Last night felt sudden pain in right lower back  - Took ibuprofen, tylenol, and methocarbamol 500 mg within a hour and half to get relief  - No pain this morning but urine is orange with a little red  Patient has been transferred to triage.

## 2022-10-05 NOTE — Patient Instructions (Addendum)
Please drink plenty of water Take antibiotic as prescribed for the next 3 days until we receive your urine culture results.   Pending next steps with tamsulosin after we reviewing culture.   If pain continue please return to office in 1 week for follow up.

## 2022-10-05 NOTE — Telephone Encounter (Signed)
Patient Name: Jasmine Bullock Gender: Female DOB: 1981-02-11 Age: 42 Y 57 M 11 D Return Phone Number: [phone] (Primary), [phone] (Secondary) Address: City/ State/ Zip: [place]  [address] Client [place] [facility] [place] [facility] [place] [facility] Provider Billey Chang- MD Contact Type Call Who Is Calling Patient / Member / Family / Caregiver Call Type Triage / Clinical Relationship To Patient Self Return Phone Number [phone] (Primary) Chief Complaint Urine, Blood In Reason for Call Symptomatic / Request for [place] states she is experiencing sudden right side back pain with orange/red urine. Translation No Nurse Assessment Nurse: Toribio Harbour, RN, Joelene Millin Date/Time (Eastern Time): 10/05/2022 10:01:45 AM Confirm and document reason for call. If symptomatic, describe symptoms. ---Caller states she is experiencing sudden right side back pain with orange/red urine. Symptoms started with "crampy" feeling in lower right back last night. She tried OTC medications but it did not help. She did take a muscle relaxer last night and was able to sleep. She noticed the orange/red urine this morning. She still has the dull back pain. She states she looked up the muscle relaxer and it can cause dark and/or brown urine. Does the patient have any new or worsening symptoms? ---Yes Will a triage be completed? ---Yes Related visit to physician within the last 2 weeks? ---No Does the PT have any chronic conditions? (i.e. diabetes, asthma, this includes High risk factors for pregnancy, etc.) ---Yes List chronic conditions. ---endometriosis, anxiety Is the patient pregnant or possibly pregnant? (Ask all females between the ages of 52-55) ---No Is this a behavioral health or substance abuse call? ---No PLEASE NOTE: All timestamps contained within this report are represented as [place] Standard  Time. CONFIDENTIALTY NOTICE: This fax transmission is intended only for the addressee. It contains information that is legally privileged, confidential or otherwise protected from use or disclosure. If you are not the intended recipient, you are strictly prohibited from reviewing, disclosing, copying using or disseminating any of this information or taking any action in reliance on or regarding this information. If you have received this fax in error, please notify us immediately by telephone so that we can arrange for its return to [place]. Phone: [phone], Toll-Free: [phone], Fax: [phone] Page: 2 of 2 Call Id: [id number] Guidelines Guideline Title Affirmed Question Affirmed Notes Nurse Date/Time Eilene Ghazi Time) Urine - Blood In Side (flank) or back pain present Toribio Harbour, RN, Joelene Millin 10/05/2022 10:05:55 AM Disp. Time Eilene Ghazi Time) Disposition Final User 10/05/2022 10:12:03 AM See PCP within 24 Hours Yes Toribio Harbour, RN, Joelene Millin Final Disposition 10/05/2022 10:12:03 AM See PCP within 24 Hours Yes Toribio Harbour, RN, Renea Ee Disagree/Comply Comply Caller Understands Yes PreDisposition Call Doctor Care Advice Given Per Guideline SEE PCP WITHIN 24 HOURS: * IF OFFICE WILL BE OPEN: You need to be examined within the next 24 hours. Call your doctor (or NP/PA) when the office opens and make an appointment. * IBUPROFEN (E.G., MOTRIN, ADVIL): Take 400 mg (two 200 mg pills) by mouth every 6 hours. The most you should take is 6 pills a day (1,200 mg total). CALL BACK IF: * Fever occurs * You become worse CARE ADVICE given per Urine, Blood In (Adult) guideline. Referrals REFERRED TO PCP OFFICE

## 2022-10-05 NOTE — Telephone Encounter (Signed)
Patient states access nurse informed her to be seen within 24 hours. Pt has been scheduled with Dr. Josephine Igo on 10/05/22 @ 11am.

## 2022-10-05 NOTE — Progress Notes (Signed)
Assessment/Plan:   Problem List Items Addressed This Visit       Other   Acute right-sided low back pain without sciatica - Primary    Differential diagnosis:  - Urinary Tract Infection (UTI): The presence of white blood cells in urine and the patient's historical experience of UTIs support this diagnosis. - Nephrolithiasis (Kidney stones): The acute, severe nature of the pain and the presence of blood in the urine without notable urinary symptoms are suggestive of renal calculi. The patient's symptoms improving could indicate the stone may have already passed.  Plan:  - Begin empiric antibiotic treatment with cephalexin 500mg  twice daily for 7 days while awaiting urine culture (to treat a possible UTI or pyelonephritis). - Instruct the patient to hydrate aggressively to aid in flushing out any possible stones and to potentially alleviate symptoms. - Monitor symptoms and encourage non-caffeinated fluid intake.  - Recommend pain management with Ibuprofen as needed. - Start Tamsulosin if urine culture is negative and symptoms do not improve, to facilitate the passage of any remaining stones. - Re-evaluate in a week or sooner if the condition worsens, with consideration for a CT scan particularly if the patient becomes acutely unwell or the pain intensifies, suggesting obstructive uropathy from a possible kidney stone.      Relevant Medications   fluconazole (DIFLUCAN) 150 MG tablet   tamsulosin (FLOMAX) 0.4 MG CAPS capsule   cephALEXin (KEFLEX) 500 MG capsule   Other Relevant Orders   POCT Urinalysis Dipstick (Completed)   Urinalysis w microscopic + reflex cultur    Medications Discontinued During This Encounter  Medication Reason   cephALEXin (KEFLEX) 500 MG capsule Reorder      Subjective:  HPI: Encounter date: 10/05/2022  Jasmine Bullock is a 42 y.o. female who has GAD (generalized anxiety disorder); SVD 8/6; and Acute right-sided low back pain without sciatica on their  problem list..   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   CHIEF COMPLAINT: The patient presents with acute onset of intense right flank pain and hematuria with a history of urinary tract infection (UTI) symptoms.   HISTORY OF PRESENT ILLNESS:  Problem 1: The patient reports an episode of severe pain the previous night with worsening symptoms, characterized by cramping in the lower abdomen and back.  The patient took Ibuprofen and Tylenol for the pain last night and also a muscle relaxer after waiting an additional 30 minutes.  Today, however, her pain is now significantly improved.  She has a remote history of UTIs but states that her current symptoms do not feel typical of her past UTI experiences.  She denies any history or family history of renal stones.  The patient denies fever, chills, nausea, vomiting, or burning during urination. The patient mentions increased caffeine intake due to fatigue from caring for her young children, which may contribute to dehydration.  REVIEW OF SYSTEMS: The patient reports pain and cramping in the lower abdomen.  Denies any nausea, vomiting, dysuria, fevers.  All other systems reviews are negative based on the information provided.  The patient has a history of endometriosis diagnosed after an egg retrieval procedure for in vitro fertilization (IVF) treatment.  Past Surgical History:  Procedure Laterality Date   L wrist surgery     PLANTAR FASCIA RELEASE Left 01/27/2020   WISDOM TOOTH EXTRACTION      Outpatient Medications Prior  to Visit  Medication Sig Dispense Refill   citalopram (CELEXA) 40 MG tablet Take 1 tablet by mouth daily.     famotidine (PEPCID) 10 MG tablet Take 10 mg by mouth 2 (two) times daily.     pantoprazole (PROTONIX) 20 MG tablet Take 20  mg by mouth daily. (Patient not taking: Reported on 10/05/2022)     No facility-administered medications prior to visit.    Family History  Problem Relation Age of Onset   Hypertension Mother    Ulcers Father    Healthy Sister    Healthy Son    Healthy Son    Ovarian cancer Maternal Grandmother    Alcohol abuse Maternal Grandfather    Hypertension Paternal Grandmother    Hyperlipidemia Paternal Grandmother    Diabetes Paternal Grandfather    Heart disease Paternal Grandfather    Hearing loss Paternal Grandfather    Hypertension Paternal Grandfather    Stroke Paternal Grandfather    Colon cancer Neg Hx    Rectal cancer Neg Hx    Esophageal cancer Neg Hx    Breast cancer Neg Hx     Social History   Socioeconomic History   Marital status: Married    Spouse name: Not on file   Number of children: 2   Years of education: Not on file   Highest education level: Not on file  Occupational History   Occupation: dental hygentist   Tobacco Use   Smoking status: Never   Smokeless tobacco: Never  Vaping Use   Vaping Use: Never used  Substance and Sexual Activity   Alcohol use: Not Currently    Alcohol/week: 3.0 standard drinks of alcohol    Types: 3 Standard drinks or equivalent per week    Comment: a glass of wine nightly   Drug use: No   Sexual activity: Yes    Partners: Male    Birth control/protection: None    Comment: infertility  Other Topics Concern   Not on file  Social History Narrative   Marital status: Married since 2009   [id number]   Social Determinants of Health   Financial Resource Strain: Not on file  Food Insecurity: Not on file  Transportation Needs: Not on file  Physical Activity: Not on file  Stress: Not on file  Social Connections: Not on file  Intimate Partner Violence: Not on file                                                                                                 Objective:  Physical Exam: BP (!) 87/51   Pulse (!) 57   Temp  (!) 97.5 F (36.4 C) (Oral)   Ht 5\' 7"  (1.702 m)   Wt 182 lb (82.6 kg)   LMP 09/29/2022   SpO2 100%   BMI 28.51 kg/m    General: No acute distress. Awake and conversant. Eyes: Normal conjunctiva, anicteric. Round symmetric pupils. ENT: Hearing grossly intact. No nasal discharge. Neck: Neck is supple. No masses or thyromegaly. Respiratory: Respirations are non-labored. No auditory wheezing. Skin: Warm. No rashes or ulcers. Psych: Alert and oriented.  Cooperative, Appropriate mood and affect, Normal judgment. CV: No cyanosis or JVD MSK: Normal ambulation. No clubbing Neuro: CN II-XII grossly normal, no tremor Abdominal: No abdominal tenderness during the examination. Renal: Flank tenderness on right, absent on left  Results for orders placed or performed in visit on 10/05/22  POCT Urinalysis Dipstick  Result Value Ref Range   Color, UA     Clarity, UA     Glucose, UA Negative Negative   Bilirubin, UA neg    Ketones, UA neg    Spec Grav, UA <=1.005 (A) 1.010 - 1.025   Blood, UA 3+    pH, UA 6.5 5.0 - 8.0   Protein, UA Negative Negative   Urobilinogen, UA 0.2 0.2 or 1.0 E.U./dL   Nitrite, UA neg    Leukocytes, UA Small (1+) (A) Negative   Appearance     Odor          Alesia Banda, MD, MS

## 2022-10-08 LAB — URINALYSIS W MICROSCOPIC + REFLEX CULTURE
Bilirubin Urine: NEGATIVE
Glucose, UA: NEGATIVE
Hyaline Cast: NONE SEEN /LPF
Ketones, ur: NEGATIVE
Nitrites, Initial: NEGATIVE
Protein, ur: NEGATIVE
Specific Gravity, Urine: 1.006 (ref 1.001–1.035)
pH: 7 (ref 5.0–8.0)

## 2022-10-08 LAB — URINE CULTURE
MICRO NUMBER:: 14476910
SPECIMEN QUALITY:: ADEQUATE

## 2022-10-08 LAB — CULTURE INDICATED

## 2022-10-09 ENCOUNTER — Telehealth: Payer: Self-pay | Admitting: Family Medicine

## 2022-10-09 ENCOUNTER — Encounter: Payer: Self-pay | Admitting: Family Medicine

## 2022-10-09 NOTE — Telephone Encounter (Signed)
Caller Name: Shadonna Call back phone #: [phone]  Reason for Call: Pt called to ask Dr. Grandville Silos to call her about her urine culture. She thinks the antibiotic isn't working. Her symptoms have not changed.

## 2022-10-09 NOTE — Telephone Encounter (Signed)
Advised to follow Home Care instructions  Patient Name: Jasmine Bullock Gender: Female DOB: Mar 06, 1981 Age: 42 Y 28 M 13 D Return Phone Number: [phone] (Primary), [phone] (Secondary) Address: City/ State/ Zip: [place]  [address] Client [facility] Client Site [facility] Night Provider Billey Chang- MD Contact Type Call Who Is Calling Patient / Member / Family / Caregiver Call Type Triage / Clinical Relationship To Patient Self Return Phone Number [phone] (Primary) Chief Complaint Urination Pain Reason for Call Symptomatic / Request for [place] states she saw someone at a different [facility] facility for a possible kidney stone or that she had passed one. She states they did a urine culture to see if she had an infection. She states she is having an intense burning sensation in her urethra and doesn't know what to do. She states she saw the results of the urine culture in her MyChart and it shows infection. Translation No Nurse Assessment Nurse: [place], RN, Estill Bamberg Date/Time (Eastern Time): 10/07/2022 3:53:29 PM Confirm and document reason for call. If symptomatic, describe symptoms. ---caller states her results on MyChart showed infection. yesterday she got results. said showed bacteria but does not show what it was. was seen thursday. Does the patient have any new or worsening symptoms? ---Yes Will a triage be completed? ---Yes Related visit to physician within the last 2 weeks? ---Yes Does the PT have any chronic conditions? (i.e. diabetes, asthma, this includes High risk factors for pregnancy, etc.) ---No Is the patient pregnant or possibly pregnant? (Ask all females between the ages of 23-55) ---No Is this a behavioral health or substance abuse call? ---No Guidelines Guideline Title Affirmed Question Affirmed Notes Nurse Date/Time  (Eastern Time) Kidney Stone Follow-up Call [1] Kidney stone diagnosed by doctor (or NP/PA) AND [2] normal symptoms (e.g., nausea, pain) AND [3] no complications [place], RN, Estill Bamberg 10/07/2022 3:58:35 PM Disp. Time Eilene Ghazi Time) Disposition Final User 10/07/2022 4:03:42 PM Home Care Yes Humfleet, RN, Estill Bamberg Final Disposition 10/07/2022 4:03:42 PM Home Care Yes [place], RN, Shelly Coss Disagree/Comply Comply Caller Understands Yes PreDisposition InappropriateToAsk Care Advice Given Per Guideline * You should be able to treat this at home. CARE ADVICE given per Kidney Stone Follow-up (Adult) guideline. * You become worse * Moderate to severe pain that does not get better after taking pain medicine CALL BACK IF: * Fever over 100.4 F (38.0 C) Comments User: Rozelle Logan, RN Date/Time Eilene Ghazi Time): 10/07/2022 3:57:06 PM provider told her she could start flomax if not feeling better. has flank pain. earlier had stinging sensation in urethra that was severe. she voided to see if it would help. says that did not feel like a UTI. User: Rozelle Logan, RN Date/Time Eilene Ghazi Time): 10/07/2022 3:59:20 PM pain level is a 2 out of 10. sometimes goes to a 5.

## 2022-10-09 NOTE — Telephone Encounter (Signed)
Patient advised through separate encounter.

## 2022-10-10 ENCOUNTER — Encounter: Payer: Self-pay | Admitting: Family Medicine

## 2022-10-11 NOTE — Telephone Encounter (Signed)
Patient requests to be called asap regarding MyChart  message sent by Patient 10/10/22

## 2022-10-12 LAB — HM PAP SMEAR: HM Pap smear: NORMAL

## 2022-10-13 ENCOUNTER — Ambulatory Visit: Payer: 59 | Admitting: Family Medicine

## 2022-10-13 ENCOUNTER — Ambulatory Visit (HOSPITAL_COMMUNITY)
Admission: RE | Admit: 2022-10-13 | Discharge: 2022-10-13 | Disposition: A | Discharge status: Home / Self Care | Payer: 59 | Source: Ambulatory Visit | Attending: Family Medicine | Admitting: Family Medicine

## 2022-10-13 VITALS — BP 116/70 | HR 60 | Temp 98.1°F | Wt 186.4 lb

## 2022-10-13 DIAGNOSIS — R1031 Right lower quadrant pain: Secondary | ICD-10-CM

## 2022-10-13 DIAGNOSIS — R5383 Other fatigue: Secondary | ICD-10-CM | POA: Diagnosis not present

## 2022-10-13 DIAGNOSIS — R630 Anorexia: Secondary | ICD-10-CM | POA: Diagnosis not present

## 2022-10-13 LAB — COMPREHENSIVE METABOLIC PANEL
ALT: 12 U/L (ref 0–35)
AST: 14 U/L (ref 0–37)
Albumin: 4.6 g/dL (ref 3.5–5.2)
Alkaline Phosphatase: 56 U/L (ref 39–117)
BUN: 12 mg/dL (ref 6–23)
CO2: 26 mEq/L (ref 19–32)
Calcium: 9.4 mg/dL (ref 8.4–10.5)
Chloride: 103 mEq/L (ref 96–112)
Creatinine, Ser: 0.75 mg/dL (ref 0.40–1.20)
GFR: 98.74 mL/min (ref >60.00)
Glucose, Bld: 92 mg/dL (ref 70–99)
Potassium: 4.3 mEq/L (ref 3.5–5.1)
Sodium: 138 mEq/L (ref 135–145)
Total Bilirubin: 0.8 mg/dL (ref 0.2–1.2)
Total Protein: 7.4 g/dL (ref 6.0–8.3)

## 2022-10-13 LAB — CBC WITH DIFFERENTIAL/PLATELET
Basophils Absolute: 0.1 10*3/uL (ref 0.0–0.1)
Basophils Relative: 0.7 % (ref 0.0–3.0)
Eosinophils Absolute: 0.2 10*3/uL (ref 0.0–0.7)
Eosinophils Relative: 2.5 % (ref 0.0–5.0)
HCT: 43.3 % (ref 36.0–46.0)
Hemoglobin: 14.9 g/dL (ref 12.0–15.0)
Lymphocytes Relative: 30.1 % (ref 12.0–46.0)
Lymphs Abs: 2.4 10*3/uL (ref 0.7–4.0)
MCHC: 34.5 g/dL (ref 30.0–36.0)
MCV: 95.1 fl (ref 78.0–100.0)
Monocytes Absolute: 0.5 10*3/uL (ref 0.1–1.0)
Monocytes Relative: 6.4 % (ref 3.0–12.0)
Neutro Abs: 4.7 10*3/uL (ref 1.4–7.7)
Neutrophils Relative %: 60.3 % (ref 43.0–77.0)
Platelets: 249 10*3/uL (ref 150.0–400.0)
RBC: 4.55 Mil/uL (ref 3.87–5.11)
RDW: 13 % (ref 11.5–15.5)
WBC: 7.9 10*3/uL (ref 4.0–10.5)

## 2022-10-13 LAB — URINALYSIS, ROUTINE W REFLEX MICROSCOPIC
Bilirubin Urine: NEGATIVE
Hgb urine dipstick: NEGATIVE
Ketones, ur: NEGATIVE
Leukocytes,Ua: NEGATIVE
Nitrite: NEGATIVE
Specific Gravity, Urine: <=1.005 — AB (ref 1.000–1.030)
Total Protein, Urine: NEGATIVE
Urine Glucose: NEGATIVE
Urobilinogen, UA: 0.2 (ref 0.0–1.0)
pH: 7 (ref 5.0–8.0)

## 2022-10-13 LAB — SEDIMENTATION RATE: Sed Rate: 4 mm/hr (ref 0–20)

## 2022-10-13 LAB — TSH: TSH: 2.13 u[IU]/mL (ref 0.35–5.50)

## 2022-10-13 MED ORDER — SULFAMETHOXAZOLE-TRIMETHOPRIM 800-160 MG PO TABS: 1.0000 | ORAL_TABLET | Freq: Two times a day (BID) | ORAL | 0 refills | Status: DC

## 2022-10-13 NOTE — Patient Instructions (Signed)
Please follow up if symptoms do not improve or as needed.    We will call you to get you set up for a CT scan next week.

## 2022-10-13 NOTE — Progress Notes (Unsigned)
Subjective  CC:  Chief Complaint  Patient presents with   Flank Pain    HPI: Jasmine Bullock is a 42 y.o. female who presents to the office today to address the problems listed above in the chief complaint. I reviewed prior notes: had acute onset of left flank pain ... Severe and last hours. Took nsaids and helped enough but pain persisted: OV treated for presumed kidney stone/UTI due to blood in urine however micro was neg for RBC. Urine culture showed 10-50K of e.coli. never had fever or dysuria. However, completed keflex. Feeling terrible. Still with flank discomfort intermittently. Feels miserable: fatigued, lack of appetite, and she is very worried about what could be the cause. No uri sxs, n/v/d or consipation. No gross hematuria. No pelvic pain. No rash.    Assessment  1. Right lower quadrant abdominal pain   2. Other fatigue   3. Lack of appetite      Plan  RLQ and flank pain:  unclear cause. Working dx was kidney stone. Not c/w pyelo. Benign abdomen and nl vitals: will recheck urine, change abx, and check renal CT for stones and get lab work. F/u pending results   Orders Placed This Encounter  Procedures   Urine Culture   CT RENAL STONE STUDY   CBC with Differential/Platelet   Comprehensive metabolic panel   Sedimentation rate   Urinalysis, Routine w reflex microscopic   TSH   Meds ordered this encounter  Medications   sulfamethoxazole-trimethoprim (BACTRIM DS) 800-160 MG tablet    Sig: Take 1 tablet by mouth 2 (two) times daily.    Dispense:  10 tablet    Refill:  0      I reviewed the patients updated PMH, FH, and SocHx.    Patient Active Problem List   Diagnosis Date Noted   GAD (generalized anxiety disorder) 01/28/2020    Priority: High   Acute right-sided low back pain without sciatica 10/05/2022   SVD 8/6 04/16/2022   Current Meds  Medication Sig   citalopram (CELEXA) 40 MG tablet Take 1 tablet by mouth daily.   famotidine (PEPCID) 10 MG tablet  Take 10 mg by mouth 2 (two) times daily.   sulfamethoxazole-trimethoprim (BACTRIM DS) 800-160 MG tablet Take 1 tablet by mouth 2 (two) times daily.   tamsulosin (FLOMAX) 0.4 MG CAPS capsule Take 1 capsule (0.4 mg total) by mouth daily.    Allergies: Patient has No Known Allergies. Family History: Patient family history includes Alcohol abuse in her maternal grandfather; Diabetes in her paternal grandfather; Healthy in her sister, son, and son; Hearing loss in her paternal grandfather; Heart disease in her paternal grandfather; Hyperlipidemia in her paternal grandmother; Hypertension in her mother, paternal grandfather, and paternal grandmother; Ovarian cancer in her maternal grandmother; Stroke in her paternal grandfather; Ulcers in her father. Social History:  Patient  reports that she has never smoked. She has never used smokeless tobacco. She reports that she does not currently use alcohol after a past usage of about 3.0 standard drinks of alcohol per week. She reports that she does not use drugs.  Review of Systems: Constitutional: Negative for fever malaise or anorexia Cardiovascular: negative for chest pain Respiratory: negative for SOB or persistent cough Gastrointestinal: negative for abdominal pain  Objective  Vitals: BP 116/70   Pulse 60   Temp 98.1 F (36.7 C)   Wt 186 lb 6.4 oz (84.6 kg)   LMP 09/29/2022   SpO2 99%   BMI 29.19 kg/m  General: no acute distress , A&Ox3, tearful and worried HEENT: PEERL, conjunctiva normal, neck is supple Cardiovascular:  RRR without murmur or gallop.  Respiratory:  Good breath sounds bilaterally, CTAB with normal respiratory effort Gastrointestinal: soft, flat abdomen, normal active bowel sounds, no palpable masses, no hepatosplenomegaly, no appreciated hernias No cvat, no suprapubic ttp Skin:  Warm, no rashes  Commons side effects, risks, benefits, and alternatives for medications and treatment plan prescribed today were discussed, and  the patient expressed understanding of the given instructions. Patient is instructed to call or message via MyChart if he/she has any questions or concerns regarding our treatment plan. No barriers to understanding were identified. We discussed Red Flag symptoms and signs in detail. Patient expressed understanding regarding what to do in case of urgent or emergency type symptoms.  Medication list was reconciled, printed and provided to the patient in AVS. Patient instructions and summary information was reviewed with the patient as documented in the AVS. This note was prepared with assistance of Dragon voice recognition software. Occasional wrong-word or sound-a-like substitutions may have occurred due to the inherent limitations of voice recognition software

## 2022-10-14 LAB — URINE CULTURE
MICRO NUMBER:: 14511704
SPECIMEN QUALITY:: ADEQUATE

## 2022-10-16 ENCOUNTER — Ambulatory Visit: Payer: 59 | Admitting: Family Medicine

## 2022-10-16 ENCOUNTER — Encounter: Payer: Self-pay | Admitting: Family Medicine

## 2022-10-20 NOTE — Telephone Encounter (Signed)
Pt would like a call back with regards with the referral that was sent to the Urologist. Please advise.

## 2022-10-23 ENCOUNTER — Encounter: Payer: Self-pay | Admitting: Family Medicine

## 2022-10-23 NOTE — Telephone Encounter (Signed)
Spoke with pt and she stated that she has already got scheduled with a Dealer.

## 2022-10-31 ENCOUNTER — Telehealth: Payer: Self-pay | Admitting: Family Medicine

## 2022-10-31 NOTE — Telephone Encounter (Signed)
Patient requests TOC from Dr. Jonni Sanger to [facility]  Please advise

## 2023-01-11 ENCOUNTER — Encounter: Payer: Self-pay | Admitting: Family Medicine

## 2023-01-11 ENCOUNTER — Ambulatory Visit: Payer: 59 | Admitting: Family Medicine

## 2023-01-11 VITALS — BP 120/74 | HR 65 | Temp 97.8°F | Ht 67.0 in | Wt 183.0 lb

## 2023-01-11 DIAGNOSIS — Z7689 Persons encountering health services in other specified circumstances: Secondary | ICD-10-CM

## 2023-01-11 DIAGNOSIS — F411 Generalized anxiety disorder: Secondary | ICD-10-CM | POA: Diagnosis not present

## 2023-01-11 DIAGNOSIS — E7800 Pure hypercholesterolemia, unspecified: Secondary | ICD-10-CM | POA: Insufficient documentation

## 2023-01-11 DIAGNOSIS — Z87442 Personal history of urinary calculi: Secondary | ICD-10-CM

## 2023-01-11 DIAGNOSIS — R928 Other abnormal and inconclusive findings on diagnostic imaging of breast: Secondary | ICD-10-CM

## 2023-01-11 HISTORY — DX: Other abnormal and inconclusive findings on diagnostic imaging of breast: R92.8

## 2023-01-11 NOTE — Progress Notes (Signed)
New Patient Office Visit  Subjective    Patient ID: Jasmine Bullock, female    DOB: Aug 16, 1981  Age: 42 y.o. MRN: [id number]  CC:  Chief Complaint  Patient presents with   Establish Care    No concerns    HPI Jasmine Bullock presents to establish care She sees Dr. Ernestina Penna at [facility]  States she has anxiety and has been on citalopram which is effective. States she has attempted to stop it but realized she felt better on the medication.   Recent kidney stone which she believes she passed. No recent symptoms.  She saw a urologist.   Hx of plantar fascitis and had surgery which improved symptoms.   2 sons- youngest is 51 months old.   Works as a Armed forces operational officer     Outpatient Encounter Medications as of 01/11/2023  Medication Sig   BIOTIN PO Take by mouth.   CALCIUM PO Take by mouth.   Cholecalciferol (VITAMIN D-3 PO) Take by mouth.   citalopram (CELEXA) 40 MG tablet Take 1 tablet by mouth daily.   famotidine (PEPCID) 10 MG tablet Take 10 mg by mouth 2 (two) times daily.   Glucosamine HCl (GLUCOSAMINE PO) Take by mouth.   Probiotic Product (PROBIOTIC PO) Take by mouth.   [DISCONTINUED] pantoprazole (PROTONIX) 20 MG tablet Take 20 mg by mouth daily. (Patient not taking: Reported on 10/05/2022)   [DISCONTINUED] sulfamethoxazole-trimethoprim (BACTRIM DS) 800-160 MG tablet Take 1 tablet by mouth 2 (two) times daily.   [DISCONTINUED] tamsulosin (FLOMAX) 0.4 MG CAPS capsule Take 1 capsule (0.4 mg total) by mouth daily.   No facility-administered encounter medications on file as of 01/11/2023.    Past Medical History:  Diagnosis Date   Abnormal mammogram 01/11/2023   Allergy    Anxiety    Asthma    childhood   Depression    Female infertility, secondary 06/19/2019   GERD (gastroesophageal reflux disease) 01/28/2020   Plantar fasciitis    left foot   Postpartum hemorrhage 1348cc 04/16/2022    Past Surgical History:  Procedure Laterality Date   L wrist surgery      PLANTAR FASCIA RELEASE Left 01/27/2020   WISDOM TOOTH EXTRACTION      Family History  Problem Relation Age of Onset   Hypertension Mother    Ulcers Father    Hearing loss Father    Healthy Sister    Healthy Son    Healthy Son    Ovarian cancer Maternal Grandmother    Cancer Maternal Grandmother    Alcohol abuse Maternal Grandfather    Hypertension Paternal Grandmother    Hyperlipidemia Paternal Grandmother    Diabetes Paternal Grandfather    Heart disease Paternal Grandfather    Hearing loss Paternal Grandfather    Hypertension Paternal Grandfather    Stroke Paternal Grandfather    Colon cancer Neg Hx    Rectal cancer Neg Hx    Esophageal cancer Neg Hx    Breast cancer Neg Hx     Social History   Socioeconomic History   Marital status: Married    Spouse name: Not on file   Number of children: 2   Years of education: Not on file   Highest education level: Not on file  Occupational History   Occupation: dental hygentist   Tobacco Use   Smoking status: Former    Packs/day: 0.25    Years: 5.00    Additional pack years: 0.00    Total pack years: 1.25  Types: Cigarettes    Quit date: 02/10/2004    Years since quitting: 18.9   Smokeless tobacco: Never   Tobacco comments:    Light social smoker in college  Vaping Use   Vaping Use: Never used  Substance and Sexual Activity   Alcohol use: Yes    Comment: 1 glass wine at night   Drug use: No   Sexual activity: Yes    Partners: Male    Birth control/protection: Condom, None    Comment: Delivered baby boy in August  Other Topics Concern   Not on file  Social History Narrative   Marital status: Married since 2009   G4P2022   Social Determinants of Health   Financial Resource Strain: Not on file  Food Insecurity: Not on file  Transportation Needs: Not on file  Physical Activity: Not on file  Stress: Not on file  Social Connections: Not on file  Intimate Partner Violence: Not on file    Review of  Systems  Constitutional:  Negative for chills and fever.  Respiratory:  Negative for shortness of breath.   Cardiovascular:  Negative for chest pain and palpitations.  Gastrointestinal:  Negative for abdominal pain, constipation, diarrhea, nausea and vomiting.  Genitourinary:  Negative for dysuria, frequency and urgency.  Neurological:  Negative for dizziness.        Objective    BP 120/74 (BP Location: Left Arm, Patient Position: Sitting, Cuff Size: Large)   Pulse 65   Temp 97.8 F (36.6 C) (Temporal)   Ht 5\' 7"  (1.702 m)   Wt 183 lb (83 kg)   SpO2 98%   BMI 28.66 kg/m   Physical Exam Constitutional:      General: She is not in acute distress.    Appearance: She is not ill-appearing.  Eyes:     Extraocular Movements: Extraocular movements intact.     Conjunctiva/sclera: Conjunctivae normal.  Cardiovascular:     Rate and Rhythm: Normal rate and regular rhythm.  Pulmonary:     Effort: Pulmonary effort is normal.     Breath sounds: Normal breath sounds.  Musculoskeletal:     Cervical back: Normal range of motion.  Skin:    General: Skin is warm and dry.  Neurological:     General: No focal deficit present.     Mental Status: She is alert and oriented to person, place, and time.  Psychiatric:        Mood and Affect: Mood normal.        Behavior: Behavior normal.        Thought Content: Thought content normal.         Assessment & Plan:   Problem List Items Addressed This Visit       Other   Elevated LDL cholesterol level - Primary   GAD (generalized anxiety disorder)   History of renal stone   Other Visit Diagnoses     Encounter to establish care          She is a pleasant 42 year old female who is new to the practice and here to establish care. Reviewed recent labs including LDL 118 with a good HDL and normal triglycerides. She has anxiety which is well controlled with citalopram. Currently feels well and in her usual state of health.  She will  follow-up when due for her CPE in December or sooner if needed.  Return if symptoms worsen or fail to improve.   Hetty Blend, NP-C

## 2023-01-11 NOTE — Patient Instructions (Signed)
Thank you for trusting us with your healthcare.    

## 2023-01-30 ENCOUNTER — Encounter: Payer: Self-pay | Admitting: Family Medicine

## 2023-03-05 ENCOUNTER — Ambulatory Visit: Payer: 59 | Admitting: Family Medicine

## 2023-03-05 VITALS — BP 108/70 | HR 67 | Ht 67.0 in | Wt 179.0 lb

## 2023-03-05 DIAGNOSIS — M7551 Bursitis of right shoulder: Secondary | ICD-10-CM

## 2023-03-05 MED ORDER — MELOXICAM 15 MG PO TABS
15.0000 mg | ORAL_TABLET | Freq: Every day | ORAL | 0 refills | Status: DC
Start: 2023-03-05 — End: 2023-08-22

## 2023-03-05 NOTE — Assessment & Plan Note (Signed)
Home exercises given, attempted osteopathic manipulation, discussed which activities to do and which ones to avoid.  Discussed keeping hands within peripheral vision.  Worsening symptoms ultrasound and x-rays.  Probable injection as well.  Meloxicam prescribed today.  Follow-up again in 4 to 6 weeks

## 2023-03-05 NOTE — Patient Instructions (Addendum)
Meloxicam 15mg  daily for 10 days Do prescribed exercises at least 3x a week Keep hands within peripheral vision See you again in 4 weeks okay to double

## 2023-03-05 NOTE — Progress Notes (Signed)
[name] [address] [place] [address] Phone: [phone] Subjective:    I'm seeing this patient by the request  of:  Hetty Blend L, NP-C  CC: right shoulder exam    [id number]  Jasmine Bullock is a 42 y.o. female coming in with complaint of pain around collar bone on the R side. Pain started about 2-3 weeks ago and getting gradually worse. Sharp pain with certain movements, pain is quick. Ibuprofen and tylenol at home helps a little bit.       Past Medical History:  Diagnosis Date   Abnormal mammogram 01/11/2023   Allergy    Anxiety    Asthma    childhood   Depression    Female infertility, secondary 06/19/2019   GERD (gastroesophageal reflux disease) 01/28/2020   Plantar fasciitis    left foot   Postpartum hemorrhage 1348cc 04/16/2022   Past Surgical History:  Procedure Laterality Date   L wrist surgery     PLANTAR FASCIA RELEASE Left 01/27/2020   WISDOM TOOTH EXTRACTION     Social History   Socioeconomic History   Marital status: Married    Spouse name: Not on file   Number of children: 2   Years of education: Not on file   Highest education level: Not on file  Occupational History   Occupation: dental hygentist   Tobacco Use   Smoking status: Former    Packs/day: 0.25    Years: 5.00    Additional pack years: 0.00    Total pack years: 1.25    Types: Cigarettes    Quit date: 02/10/2004    Years since quitting: 19.0   Smokeless tobacco: Never   Tobacco comments:    Light social smoker in college  Vaping Use   Vaping Use: Never used  Substance and Sexual Activity   Alcohol use: Yes    Comment: 1 glass wine at night   Drug use: No   Sexual activity: Yes    Partners: Male    Birth control/protection: Condom, None    Comment: Delivered baby boy in August  Other Topics Concern   Not on file  Social History Narrative   Marital status: Married since 2009   G4P2022   Social Determinants of Health    Financial Resource Strain: Not on file  Food Insecurity: Not on file  Transportation Needs: Not on file  Physical Activity: Not on file  Stress: Not on file  Social Connections: Not on file   No Known Allergies Family History  Problem Relation Age of Onset   Hypertension Mother    Ulcers Father    Hearing loss Father    Healthy Sister    Healthy Son    Healthy Son    Ovarian cancer Maternal Grandmother    Cancer Maternal Grandmother    Alcohol abuse Maternal Grandfather    Hypertension Paternal Grandmother    Hyperlipidemia Paternal Grandmother    Diabetes Paternal Grandfather    Heart disease Paternal Grandfather    Hearing loss Paternal Grandfather    Hypertension Paternal Grandfather    Stroke Paternal Grandfather    Colon cancer Neg Hx    Rectal cancer Neg Hx    Esophageal cancer Neg Hx    Breast cancer Neg Hx        Current Outpatient Medications (Analgesics):    meloxicam (MOBIC) 15 MG tablet, Take 1 tablet (15 mg total) by mouth daily.   Current Outpatient Medications (Other):  BIOTIN PO, Take by mouth.   CALCIUM PO, Take by mouth.   Cholecalciferol (VITAMIN D-3 PO), Take by mouth.   citalopram (CELEXA) 40 MG tablet, Take 1 tablet by mouth daily.   famotidine (PEPCID) 10 MG tablet, Take 10 mg by mouth 2 (two) times daily.   Glucosamine HCl (GLUCOSAMINE PO), Take by mouth.   Probiotic Product (PROBIOTIC PO), Take by mouth.    Objective  Blood pressure 108/70, pulse 67, height 5\' 7"  (1.702 m), weight 179 lb (81.2 kg), SpO2 99 %, currently breastfeeding.   General: No apparent distress alert and oriented x3 mood and affect normal, dressed appropriately.  HEENT: Pupils equal, extraocular movements intact  Respiratory: Patient's speak in full sentences and does not appear short of breath  Cardiovascular: No lower extremity edema, non tender, no erythema  Right shoulder exam does show positive impingement noted.  Rotator cuff strength is intact.   Positive O'Brien's noted.    Impression and Recommendations:    The above documentation has been reviewed and is accurate and complete Judi Saa, DO

## 2023-03-29 NOTE — Progress Notes (Deleted)
[facility] [address] [place] [address] Phone: [phone] Subjective:    I'm seeing this patient by the request  of:  Jasmine Shackleton, NP-C  CC:   [id number]  03/05/2023 Home exercises given, attempted osteopathic manipulation, discussed which activities to do and which ones to avoid.  Discussed keeping hands within peripheral vision.  Worsening symptoms ultrasound and x-rays.  Probable injection as well.  Meloxicam prescribed today.  Follow-up again in 4 to 6 weeks      Update 04/03/2023 Jasmine Bullock is a 42 y.o. female coming in with complaint of R shoulder pain. Patient states       Past Medical History:  Diagnosis Date   Abnormal mammogram 01/11/2023   Allergy    Anxiety    Asthma    childhood   Depression    Female infertility, secondary 06/19/2019   GERD (gastroesophageal reflux disease) 01/28/2020   Plantar fasciitis    left foot   Postpartum hemorrhage 1348cc 04/16/2022   Past Surgical History:  Procedure Laterality Date   L wrist surgery     PLANTAR FASCIA RELEASE Left 01/27/2020   WISDOM TOOTH EXTRACTION     Social History   Socioeconomic History   Marital status: Married    Spouse name: Not on file   Number of children: 2   Years of education: Not on file   Highest education level: Not on file  Occupational History   Occupation: dental hygentist   Tobacco Use   Smoking status: Former    Current packs/day: 0.00    Average packs/day: 0.3 packs/day for 5.0 years (1.3 ttl pk-yrs)    Types: Cigarettes    Start date: 02/10/1999    Quit date: 02/10/2004    Years since quitting: 19.1   Smokeless tobacco: Never   Tobacco comments:    Light social smoker in college  Vaping Use   Vaping status: Never Used  Substance and Sexual Activity   Alcohol use: Yes    Comment: 1 glass wine at night   Drug use: No   Sexual activity: Yes    Partners: Male    Birth control/protection: Condom, None    Comment:  Delivered baby boy in August  Other Topics Concern   Not on file  Social History Narrative   Marital status: Married since 2009   G4P2022   Social Determinants of Health   Financial Resource Strain: Not on file  Food Insecurity: Not on file  Transportation Needs: Not on file  Physical Activity: Not on file  Stress: Not on file  Social Connections: Not on file   No Known Allergies Family History  Problem Relation Age of Onset   Hypertension Mother    Ulcers Father    Hearing loss Father    Healthy Sister    Healthy Son    Healthy Son    Ovarian cancer Maternal Grandmother    Cancer Maternal Grandmother    Alcohol abuse Maternal Grandfather    Hypertension Paternal Grandmother    Hyperlipidemia Paternal Grandmother    Diabetes Paternal Grandfather    Heart disease Paternal Grandfather    Hearing loss Paternal Grandfather    Hypertension Paternal Grandfather    Stroke Paternal Grandfather    Colon cancer Neg Hx    Rectal cancer Neg Hx    Esophageal cancer Neg Hx    Breast cancer Neg Hx        Current Outpatient Medications (Analgesics):  meloxicam (MOBIC) 15 MG tablet, Take 1 tablet (15 mg total) by mouth daily.   Current Outpatient Medications (Other):    BIOTIN PO, Take by mouth.   CALCIUM PO, Take by mouth.   Cholecalciferol (VITAMIN D-3 PO), Take by mouth.   citalopram (CELEXA) 40 MG tablet, Take 1 tablet by mouth daily.   famotidine (PEPCID) 10 MG tablet, Take 10 mg by mouth 2 (two) times daily.   Glucosamine HCl (GLUCOSAMINE PO), Take by mouth.   Probiotic Product (PROBIOTIC PO), Take by mouth.   Reviewed prior external information including notes and imaging from  primary care provider As well as notes that were available from care everywhere and other healthcare systems.  Past medical history, social, surgical and family history all reviewed in electronic medical record.  No pertanent information unless stated regarding to the chief complaint.    Review of Systems:  No headache, visual changes, nausea, vomiting, diarrhea, constipation, dizziness, abdominal pain, skin rash, fevers, chills, night sweats, weight loss, swollen lymph nodes, body aches, joint swelling, chest pain, shortness of breath, mood changes. POSITIVE muscle aches  Objective  currently breastfeeding.   General: No apparent distress alert and oriented x3 mood and affect normal, dressed appropriately.  HEENT: Pupils equal, extraocular movements intact  Respiratory: Patient's speak in full sentences and does not appear short of breath  Cardiovascular: No lower extremity edema, non tender, no erythema      Impression and Recommendations:

## 2023-04-03 ENCOUNTER — Ambulatory Visit: Payer: 59 | Admitting: Family Medicine

## 2023-05-01 LAB — HM MAMMOGRAPHY

## 2023-05-02 ENCOUNTER — Encounter: Payer: Self-pay | Admitting: Obstetrics

## 2023-07-04 NOTE — Progress Notes (Unsigned)
[name] [address] [place] [address] Phone: [phone] Subjective:   INadine Counts, am serving as a scribe for Dr. Antoine Primas.  I'm seeing this patient by the request  of:  Henson, Vickie L, NP-C  CC: right shoulder pain   [id number]  Jasmine Bullock is a 42 y.o. female coming in with complaint of R shoulder pain. Patient states pain has had pain for about 3 months. Getting worse. Ibuprofen helps. Pain with movement. Overhead  reaching is the worse. Quick sharp pain. Can wake her at night. Feels deeper in the joint.     Past Medical History:  Diagnosis Date   Abnormal mammogram 01/11/2023   Allergy    Anxiety    Asthma    childhood   Depression    Female infertility, secondary 06/19/2019   GERD (gastroesophageal reflux disease) 01/28/2020   Plantar fasciitis    left foot   Postpartum hemorrhage 1348cc 04/16/2022   Past Surgical History:  Procedure Laterality Date   L wrist surgery     PLANTAR FASCIA RELEASE Left 01/27/2020   WISDOM TOOTH EXTRACTION     Social History   Socioeconomic History   Marital status: Married    Spouse name: Not on file   Number of children: 2   Years of education: Not on file   Highest education level: Not on file  Occupational History   Occupation: dental hygentist   Tobacco Use   Smoking status: Former    Current packs/day: 0.00    Average packs/day: 0.3 packs/day for 5.0 years (1.3 ttl pk-yrs)    Types: Cigarettes    Start date: 02/10/1999    Quit date: 02/10/2004    Years since quitting: 19.4   Smokeless tobacco: Never   Tobacco comments:    Light social smoker in college  Vaping Use   Vaping status: Never Used  Substance and Sexual Activity   Alcohol use: Yes    Comment: 1 glass wine at night   Drug use: No   Sexual activity: Yes    Partners: Male    Birth control/protection: Condom, None    Comment: Delivered baby boy in August  Other Topics Concern   Not on file   Social History Narrative   Marital status: Married since 2009   G4P2022   Social Determinants of Health   Financial Resource Strain: Not on file  Food Insecurity: Not on file  Transportation Needs: Not on file  Physical Activity: Not on file  Stress: Not on file  Social Connections: Not on file   No Known Allergies Family History  Problem Relation Age of Onset   Hypertension Mother    Ulcers Father    Hearing loss Father    Healthy Sister    Healthy Son    Healthy Son    Ovarian cancer Maternal Grandmother    Cancer Maternal Grandmother    Alcohol abuse Maternal Grandfather    Hypertension Paternal Grandmother    Hyperlipidemia Paternal Grandmother    Diabetes Paternal Grandfather    Heart disease Paternal Grandfather    Hearing loss Paternal Grandfather    Hypertension Paternal Grandfather    Stroke Paternal Grandfather    Colon cancer Neg Hx    Rectal cancer Neg Hx    Esophageal cancer Neg Hx    Breast cancer Neg Hx        Current Outpatient Medications (Analgesics):    meloxicam (MOBIC) 15 MG tablet, Take 1  tablet (15 mg total) by mouth daily.   Current Outpatient Medications (Other):    BIOTIN PO, Take by mouth.   CALCIUM PO, Take by mouth.   Cholecalciferol (VITAMIN D-3 PO), Take by mouth.   citalopram (CELEXA) 40 MG tablet, Take 1 tablet by mouth daily.   famotidine (PEPCID) 10 MG tablet, Take 10 mg by mouth 2 (two) times daily.   Glucosamine HCl (GLUCOSAMINE PO), Take by mouth.   Probiotic Product (PROBIOTIC PO), Take by mouth.   Reviewed prior external information including notes and imaging from  primary care provider As well as notes that were available from care everywhere and other healthcare systems.  Past medical history, social, surgical and family history all reviewed in electronic medical record.  No pertanent information unless stated regarding to the chief complaint.   Review of Systems:  No headache, visual changes, nausea, vomiting,  diarrhea, constipation, dizziness, abdominal pain, skin rash, fevers, chills, night sweats, weight loss, swollen lymph nodes, body aches, joint swelling, chest pain, shortness of breath, mood changes. POSITIVE muscle aches  Objective  currently breastfeeding.   General: No apparent distress alert and oriented x3 mood and affect normal, dressed appropriately.  HEENT: Pupils equal, extraocular movements intact  Respiratory: Patient's speak in full sentences and does not appear short of breath  Cardiovascular: No lower extremity edema, non tender, no erythema   Shoulder exam shows noted of the right shoulder.  Patient does have some positive crossover noted.  Procedure: Real-time Ultrasound Guided Injection of right glenohumeral joint Device: GE Logiq Q7  Ultrasound guided injection is preferred based studies that show increased duration, increased effect, greater accuracy, decreased procedural pain, increased response rate with ultrasound guided versus blind injection.  Verbal informed consent obtained.  Time-out conducted.  Noted no overlying erythema, induration, or other signs of local infection.  Skin prepped in a sterile fashion.  Local anesthesia: Topical Ethyl chloride.  With sterile technique and under real time ultrasound guidance:  Joint visualized.  23g 1  inch needle inserted posterior approach. Pictures taken for needle placement. Patient did have injection of 2 cc of 1% lidocaine, 2 cc of 0.5% Marcaine, and 1.0 cc of Kenalog 40 mg/dL. Completed without difficulty  Pain immediately resolved suggesting accurate placement of the medication.  Advised to call if fevers/chills, erythema, induration, drainage, or persistent bleeding.  Impression: Technically successful ultrasound guided injection.  Procedure: Real-time Ultrasound Guided Injection of right acromioclavicular joint Device: GE Logiq Q7 Ultrasound guided injection is preferred based studies that show increased duration,  increased effect, greater accuracy, decreased procedural pain, increased response rate, and decreased cost with ultrasound guided versus blind injection.  Verbal informed consent obtained.  Time-out conducted.  Noted no overlying erythema, induration, or other signs of local infection.  Skin prepped in a sterile fashion.  Local anesthesia: Topical Ethyl chloride.  With sterile technique and under real time ultrasound guidance: With a 25-gauge half inch needle injected with 0.5 cc of 0.5% Marcaine and 0.5 cc of Kenalog 40 mg/mL Completed without difficulty  Pain immediately resolved suggesting accurate placement of the medication.  Advised to call if fevers/chills, erythema, induration, drainage, or persistent bleeding.  Impression: Technically successful ultrasound guided injection.   Impression and Recommendations:     The above documentation has been reviewed and is accurate and complete Judi Saa, DO

## 2023-07-05 ENCOUNTER — Ambulatory Visit: Payer: 59 | Admitting: Family Medicine

## 2023-07-05 ENCOUNTER — Other Ambulatory Visit: Payer: Self-pay

## 2023-07-05 ENCOUNTER — Encounter: Payer: Self-pay | Admitting: Family Medicine

## 2023-07-05 VITALS — BP 96/62 | HR 67 | Ht 67.0 in | Wt 180.0 lb

## 2023-07-05 DIAGNOSIS — M7551 Bursitis of right shoulder: Secondary | ICD-10-CM | POA: Diagnosis not present

## 2023-07-05 DIAGNOSIS — M25411 Effusion, right shoulder: Secondary | ICD-10-CM | POA: Diagnosis not present

## 2023-07-05 NOTE — Assessment & Plan Note (Signed)
Patient given injection and tolerated the procedure well as well discussed icing regimen and home exercises, discussed avoiding certain activities.  I can do anytime with as needed.  Will follow-up again in 6 to 8 weeks

## 2023-07-05 NOTE — Patient Instructions (Addendum)
Injections in shoulder today Good to see you! See you again in 6-8 weeks

## 2023-07-05 NOTE — Assessment & Plan Note (Signed)
Patient given injection today and tolerated the procedure well.  Process of moving and likely will be monitored.  Follow-up with me again 6 to 8 weeks.

## 2023-07-06 ENCOUNTER — Ambulatory Visit: Payer: Self-pay | Admitting: Family Medicine

## 2023-07-19 ENCOUNTER — Other Ambulatory Visit: Payer: Self-pay | Admitting: Family Medicine

## 2023-07-19 ENCOUNTER — Telehealth: Payer: Self-pay | Admitting: Family Medicine

## 2023-07-19 ENCOUNTER — Encounter: Payer: Self-pay | Admitting: Family Medicine

## 2023-07-19 DIAGNOSIS — F411 Generalized anxiety disorder: Secondary | ICD-10-CM

## 2023-07-19 MED ORDER — CITALOPRAM HYDROBROMIDE 40 MG PO TABS: 40.0000 mg | ORAL_TABLET | Freq: Every day | ORAL | 4 refills | Status: DC

## 2023-08-21 NOTE — Progress Notes (Unsigned)
[name] [address] [place] [address] Phone: [phone] Subjective:   Bruce Donath, am serving as a scribe for Dr. Antoine Primas.  I'm seeing this patient by the request  of:  Avanell Shackleton, NP-C  CC: shoulder pain follow-up  [id number]  07/05/2023 Patient given injection and tolerated the procedure well as well discussed icing regimen and home exercises, discussed avoiding certain activities. I can do anytime with as needed. Will follow-up again in 6 to 8 weeks   Patient given injection today and tolerated the procedure well.  Process of moving and likely will be monitored.  Follow-up with me again 6 to 8 weeks.      Update 08/22/2023 Jasmine Bullock is a 42 y.o. female coming in with complaint of R shoulder pain.  Patient was found to have more of a bursitis and given an injection.  Also had an effusion of the acromioclavicular joint.  Patient states that she is doing well. Carried a lot of boxes during a move recently.        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   GERD (gastroesophageal reflux disease) 01/28/2020   Plantar fasciitis    left foot   Postpartum hemorrhage 1348cc 04/16/2022   Pregnancy 09/19/2021   Past Surgical History:  Procedure Laterality Date   L wrist surgery     PLANTAR FASCIA RELEASE Left 01/27/2020   WISDOM TOOTH EXTRACTION     Social History   Socioeconomic History   Marital status: Married    Spouse name: Not on file   Number of children: 2   Years of education: Not on  file   Highest education level: Bachelor's degree (e.g., BA, AB, BS)  Occupational History   Occupation: dental hygentist   Tobacco Use   Smoking status: Former    Current packs/day: 0.00    Average packs/day: 0.3 packs/day for 5.2 years (1.3 ttl pk-yrs)    Types: Cigarettes    Start date: 02/10/1999    Quit date: 02/10/2004    Years since quitting: 19.5   Smokeless tobacco: Never   Tobacco comments:    Light social smoker in college  Vaping Use   Vaping status: Never Used  Substance and Sexual Activity   Alcohol use: Yes    Comment: Rarely   Drug use: No   Sexual activity: Yes    Partners: Male    Birth control/protection: Condom, None    Comment: Delivered baby boy in August  Other Topics Concern   Not on file  Social History Narrative   Marital status: Married since 2009   G4P2022   Social Determinants of Health   Financial Resource Strain: Low Risk  (08/21/2023)   Overall Financial Resource Strain (CARDIA)    Difficulty of Paying Living Expenses: Not hard at all  Food Insecurity: No Food Insecurity (08/21/2023)   Hunger Vital Sign    Worried About Running Out of Food in the Last Year: Never true    Ran  Out of Food in the Last Year: Never true  Transportation Needs: No Transportation Needs (08/21/2023)   PRAPARE - Administrator, Civil Service (Medical): No    Lack of Transportation (Non-Medical): No  Physical Activity: Sufficiently Active (08/21/2023)   Exercise Vital Sign    Days of Exercise per Week: 4 days    Minutes of Exercise per Session: 40 min  Stress: No Stress Concern Present (08/21/2023)   [name] of Occupational Health - Occupational Stress Questionnaire    Feeling of Stress : Not at all  Social Connections: Socially Integrated (08/21/2023)   Social Connection and Isolation Panel [NHANES]    Frequency of Communication with Friends and Family: More than three times a week    Frequency of Social Gatherings with Friends and Family:  Once a week    Attends Religious Services: More than 4 times per year    Active Member of [employer] or Organizations: Yes    Attends Engineer, structural: 1 to 4 times per year    Marital Status: Married   Allergies  Allergen Reactions   Cat Hair Extract Other (See Comments)   Dust Mite Extract Other (See Comments)   Family History  Problem Relation Age of Onset   Hypertension Mother    Ulcers Father    Hearing loss Father    Healthy Sister    Healthy Son    Healthy Son    Ovarian cancer Maternal Grandmother    Cancer Maternal Grandmother    Alcohol abuse Maternal Grandfather    Hypertension Paternal Grandmother    Hyperlipidemia Paternal Grandmother    Diabetes Paternal Grandfather    Heart disease Paternal Grandfather    Hearing loss Paternal Grandfather    Hypertension Paternal Grandfather    Stroke Paternal Grandfather    Colon cancer Neg Hx    Rectal cancer Neg Hx    Esophageal cancer Neg Hx    Breast cancer Neg Hx          Current Outpatient Medications (Other):    BIOTIN PO, Take by mouth.   CALCIUM PO, Take by mouth.   Cholecalciferol (VITAMIN D-3 PO), Take by mouth.   famotidine (PEPCID) 10 MG tablet, Take 10 mg by mouth 2 (two) times daily.   Glucosamine HCl (GLUCOSAMINE PO), Take by mouth.   Probiotic Product (PROBIOTIC PO), Take by mouth.   citalopram (CELEXA) 20 MG tablet, Take 1 tablet (20 mg total) by mouth daily.   Prenatal Vit-Fe Fumarate-FA (PRENATAL PO), daily.   Reviewed prior external information including notes and imaging from  primary care provider As well as notes that were available from care everywhere and other healthcare systems.  Past medical history, social, surgical and family history all reviewed in electronic medical record.  No pertanent information unless stated regarding to the chief complaint.   Review of Systems:  No headache, visual changes, nausea, vomiting, diarrhea, constipation, dizziness, abdominal pain, skin  rash, fevers, chills, night sweats, weight loss, swollen lymph nodes, body aches, joint swelling, chest pain, shortness of breath, mood changes. POSITIVE muscle aches  Objective  Blood pressure 102/72, pulse 64, height 5\' 7"  (1.702 m), weight 178 lb (80.7 kg), SpO2 98%, not currently breastfeeding.   General: No apparent distress alert and oriented x3 mood and affect normal, dressed appropriately.  HEENT: Pupils equal, extraocular movements intact  Respiratory: Patient's speak in full sentences and does not appear short of breath  Cardiovascular: No lower extremity edema, non tender, no erythema  Right shoulder exam shows of significant improvement in range of motion.  Very mild impingement still noted.  Good range of motion noted.  5 out of 5 strength  Limited muscular skeletal ultrasound was performed and interpreted by Antoine Primas, M  Limited ultrasound shows some significant decrease in the hypoechoic changes that overall seems to be completely resolved. Impression: Significant interval improvement    Impression and Recommendations:    The above documentation has been reviewed and is accurate and complete Judi Saa, DO

## 2023-08-22 ENCOUNTER — Ambulatory Visit: Payer: 59 | Admitting: Family Medicine

## 2023-08-22 ENCOUNTER — Ambulatory Visit (INDEPENDENT_AMBULATORY_CARE_PROVIDER_SITE_OTHER): Payer: 59 | Admitting: Family Medicine

## 2023-08-22 ENCOUNTER — Ambulatory Visit: Payer: Self-pay

## 2023-08-22 ENCOUNTER — Encounter: Payer: Self-pay | Admitting: Family Medicine

## 2023-08-22 VITALS — BP 102/72 | HR 64 | Ht 67.0 in | Wt 178.0 lb

## 2023-08-22 VITALS — BP 110/76 | HR 62 | Temp 97.6°F | Ht 67.0 in | Wt 178.0 lb

## 2023-08-22 DIAGNOSIS — Z8759 Personal history of other complications of pregnancy, childbirth and the puerperium: Secondary | ICD-10-CM | POA: Diagnosis not present

## 2023-08-22 DIAGNOSIS — N926 Irregular menstruation, unspecified: Secondary | ICD-10-CM | POA: Diagnosis not present

## 2023-08-22 DIAGNOSIS — Z0001 Encounter for general adult medical examination with abnormal findings: Secondary | ICD-10-CM

## 2023-08-22 DIAGNOSIS — Z8659 Personal history of other mental and behavioral disorders: Secondary | ICD-10-CM | POA: Insufficient documentation

## 2023-08-22 DIAGNOSIS — M7551 Bursitis of right shoulder: Secondary | ICD-10-CM

## 2023-08-22 DIAGNOSIS — R5383 Other fatigue: Secondary | ICD-10-CM | POA: Diagnosis not present

## 2023-08-22 DIAGNOSIS — R21 Rash and other nonspecific skin eruption: Secondary | ICD-10-CM | POA: Insufficient documentation

## 2023-08-22 DIAGNOSIS — E7800 Pure hypercholesterolemia, unspecified: Secondary | ICD-10-CM | POA: Diagnosis not present

## 2023-08-22 DIAGNOSIS — Z23 Encounter for immunization: Secondary | ICD-10-CM | POA: Diagnosis not present

## 2023-08-22 DIAGNOSIS — F411 Generalized anxiety disorder: Secondary | ICD-10-CM

## 2023-08-22 DIAGNOSIS — M25511 Pain in right shoulder: Secondary | ICD-10-CM | POA: Diagnosis not present

## 2023-08-22 DIAGNOSIS — O289 Unspecified abnormal findings on antenatal screening of mother: Secondary | ICD-10-CM

## 2023-08-22 HISTORY — DX: Unspecified abnormal findings on antenatal screening of mother: O28.9

## 2023-08-22 LAB — COMPREHENSIVE METABOLIC PANEL
ALT: 13 U/L (ref 0–35)
AST: 18 U/L (ref 0–37)
Albumin: 4.5 g/dL (ref 3.5–5.2)
Alkaline Phosphatase: 40 U/L (ref 39–117)
BUN: 13 mg/dL (ref 6–23)
CO2: 25 meq/L (ref 19–32)
Calcium: 8.8 mg/dL (ref 8.4–10.5)
Chloride: 105 meq/L (ref 96–112)
Creatinine, Ser: 0.73 mg/dL (ref 0.40–1.20)
GFR: 101.38 mL/min (ref >60.00)
Glucose, Bld: 97 mg/dL (ref 70–99)
Potassium: 4.3 meq/L (ref 3.5–5.1)
Sodium: 139 meq/L (ref 135–145)
Total Bilirubin: 0.5 mg/dL (ref 0.2–1.2)
Total Protein: 7.4 g/dL (ref 6.0–8.3)

## 2023-08-22 LAB — CBC WITH DIFFERENTIAL/PLATELET
Basophils Absolute: 0 10*3/uL (ref 0.0–0.1)
Basophils Relative: 0.4 % (ref 0.0–3.0)
Eosinophils Absolute: 0.1 10*3/uL (ref 0.0–0.7)
Eosinophils Relative: 1.3 % (ref 0.0–5.0)
HCT: 44.1 % (ref 36.0–46.0)
Hemoglobin: 14.7 g/dL (ref 12.0–15.0)
Lymphocytes Relative: 24 % (ref 12.0–46.0)
Lymphs Abs: 2 10*3/uL (ref 0.7–4.0)
MCHC: 33.4 g/dL (ref 30.0–36.0)
MCV: 97.7 fL (ref 78.0–100.0)
Monocytes Absolute: 0.5 10*3/uL (ref 0.1–1.0)
Monocytes Relative: 5.6 % (ref 3.0–12.0)
Neutro Abs: 5.6 10*3/uL (ref 1.4–7.7)
Neutrophils Relative %: 68.7 % (ref 43.0–77.0)
Platelets: 313 10*3/uL (ref 150.0–400.0)
RBC: 4.51 Mil/uL (ref 3.87–5.11)
RDW: 13.5 % (ref 11.5–15.5)
WBC: 8.2 10*3/uL (ref 4.0–10.5)

## 2023-08-22 LAB — LIPID PANEL
Cholesterol: 177 mg/dL (ref 0–200)
HDL: 63.2 mg/dL (ref >39.00)
LDL Cholesterol: 103 mg/dL — ABNORMAL HIGH (ref 0–99)
NonHDL: 113.91
Total CHOL/HDL Ratio: 3
Triglycerides: 56 mg/dL (ref 0.0–149.0)
VLDL: 11.2 mg/dL (ref 0.0–40.0)

## 2023-08-22 LAB — VITAMIN B12: Vitamin B-12: 495 pg/mL (ref 211–911)

## 2023-08-22 LAB — FOLATE: Folate: >24.2 ng/mL (ref >5.9)

## 2023-08-22 LAB — T4, FREE: Free T4: 0.8 ng/dL (ref 0.60–1.60)

## 2023-08-22 LAB — TSH: TSH: 1.44 u[IU]/mL (ref 0.35–5.50)

## 2023-08-22 LAB — VITAMIN D 25 HYDROXY (VIT D DEFICIENCY, FRACTURES): VITD: 29.46 ng/mL — ABNORMAL LOW (ref 30.00–100.00)

## 2023-08-22 LAB — FERRITIN: Ferritin: 27.2 ng/mL (ref 10.0–291.0)

## 2023-08-22 MED ORDER — CITALOPRAM HYDROBROMIDE 20 MG PO TABS: 20.0000 mg | ORAL_TABLET | Freq: Every day | ORAL | 3 refills | Status: DC

## 2023-08-22 NOTE — Patient Instructions (Signed)
Please go downstairs for labs.   Try Aquaphor on the dry area. You can also try over the counter hydrocortisone but do not get this in your eye.   Follow up with dermatology if not improving.   We will be in touch with your results.

## 2023-08-22 NOTE — Assessment & Plan Note (Signed)
Check labs for underlying etiology. Prolonged period. R/o anemia

## 2023-08-22 NOTE — Assessment & Plan Note (Signed)
Check fasting lipids and follow up. Healthy diet and exercise.

## 2023-08-22 NOTE — Assessment & Plan Note (Signed)
Doing fairly well on citalopram 20 mg

## 2023-08-22 NOTE — Assessment & Plan Note (Signed)
Unknown etiology. Try Aquaphor then hydrocortisone. Follow up with dermatology if not improving.

## 2023-08-22 NOTE — Assessment & Plan Note (Signed)
On ultrasound today seem to completely resolved.  Discussed icing regimen and home exercises when needed.  Continue to work on Air cabin crew, if needed could be a candidate for possible injections again for osteopathic manipulation.  Will follow-up with me again as needed

## 2023-08-22 NOTE — Progress Notes (Signed)
Complete physical exam  Patient: Jasmine Bullock   DOB: [birth year]   42 y.o. Female  MRN: [id number]  Subjective:    Chief Complaint  Patient presents with   Annual Exam    Fasting    She is here for a complete physical exam. Sees OB/GYN this week.    Prolonged menstrual cycle. [place] recently. [facility]  Cyst on right ovary. Prescribed progesterone which has made the bleeding worse.  She is going to have a hysteroscopy. Hx of endometrial biopsy.   States she had CBC last week to look for anemia and it was normal.   Dry patch under left eye x 2 weeks. Sunscreen burns.        Health Maintenance  Topic Date Due   COVID-19 Vaccine (4 - 2023-24 season) 09/07/2023*   Pap with HPV screening  10/12/2025   DTaP/Tdap/Td vaccine (5 - Td or Tdap) 02/08/2032   Flu Shot  Completed   Hepatitis C Screening  Completed   HIV Screening  Completed   HPV Vaccine  Aged Out  *Topic was postponed. The date shown is not the original due date.    Wears seatbelt always, uses sunscreen, smoke detectors in home and functioning, does not text while driving, feels safe in home environment.  Depression screening:    08/22/2023   10:17 AM 01/11/2023    9:45 AM 10/13/2022   11:23 AM  Depression screen PHQ 2/9  Decreased Interest 0 0 1  Down, Depressed, Hopeless 0 0 1  PHQ - 2 Score 0 0 2  Altered sleeping   1  Tired, decreased energy   2  Change in appetite   2  Feeling bad or failure about yourself    0  Trouble concentrating   0  Moving slowly or fidgety/restless   0  Suicidal thoughts   0  PHQ-9 Score   7  Difficult doing work/chores   Somewhat difficult   Anxiety Screening:    10/13/2022   11:23 AM 11/05/2019    4:04 PM  GAD 7 : Generalized Anxiety Score  Nervous, Anxious, on Edge 1 0  Control/stop worrying 1 0  Worry too much - different things 1 0  Trouble relaxing 0 0  Restless 0 0  Easily annoyed or irritable 1 0  Afraid - awful might happen 0 0  Total GAD 7  Score 4 0  Anxiety Difficulty Somewhat difficult     Vision:Not within last year  and Dental: No current dental problems and Receives regular dental care  Patient Active Problem List   Diagnosis Date Noted   History of postpartum depression 08/22/2023   Fatigue 08/22/2023   Rash of face 08/22/2023   Effusion of acromioclavicular joint, right 07/05/2023   Acute bursitis of right shoulder 03/05/2023   Pure hypercholesterolemia 01/11/2023   History of renal stone 01/11/2023   Acute right-sided low back pain without sciatica 10/05/2022   SVD 8/6 04/16/2022   GAD (generalized anxiety disorder) 01/28/2020   Past Medical History:  Diagnosis Date   Abnormal finding on antenatal screening of mother 08/22/2023   low PAPP-A     Abnormal mammogram 01/11/2023   Acute thrombosis of superficial veins of upper extremity 04/19/2022   left; dx'd 4d PP     Allergy    Anxiety    Asthma    childhood   Bacterial vaginosis in pregnancy 12/15/2021   Candidiasis of vagina 12/15/2021   Depression    Depressive disorder 12/30/2020  Female infertility, secondary 06/19/2019   GERD (gastroesophageal reflux disease) 01/28/2020   Plantar fasciitis    left foot   Postpartum hemorrhage 1348cc 04/16/2022   Pregnancy 09/19/2021   Past Surgical History:  Procedure Laterality Date   L wrist surgery     PLANTAR FASCIA RELEASE Left 01/27/2020   WISDOM TOOTH EXTRACTION     Social History   Tobacco Use   Smoking status: Former    Current packs/day: 0.00    Average packs/day: 0.3 packs/day for 5.2 years (1.3 ttl pk-yrs)    Types: Cigarettes    Start date: 02/10/1999    Quit date: 02/10/2004    Years since quitting: 19.5   Smokeless tobacco: Never   Tobacco comments:    Light social smoker in college  Vaping Use   Vaping status: Never Used  Substance Use Topics   Alcohol use: Yes    Comment: Rarely   Drug use: No      Patient Care Team: Avanell Shackleton, NP-C as PCP - General (Family  Medicine) Tressia Danas, MD (Inactive) as Consulting Physician (Gastroenterology) Toni Arthurs, MD as Consulting Physician (Orthopedic Surgery) Noland Fordyce, MD as Consulting Physician (Obstetrics and Gynecology)   Outpatient Medications Prior to Visit  Medication Sig Note   BIOTIN PO Take by mouth.    CALCIUM PO Take by mouth.    Cholecalciferol (VITAMIN D-3 PO) Take by mouth.    famotidine (PEPCID) 10 MG tablet Take 10 mg by mouth 2 (two) times daily.    Glucosamine HCl (GLUCOSAMINE PO) Take by mouth.    Prenatal Vit-Fe Fumarate-FA (PRENATAL PO) daily.    Probiotic Product (PROBIOTIC PO) Take by mouth.    [DISCONTINUED] citalopram (CELEXA) 40 MG tablet Take 1 tablet (40 mg total) by mouth daily. 08/22/2023: Taking 20 mg   [DISCONTINUED] meloxicam (MOBIC) 15 MG tablet Take 1 tablet (15 mg total) by mouth daily.    [DISCONTINUED] progesterone 50 MG/ML injection Inject 50 mg into the muscle daily.    No facility-administered medications prior to visit.    Review of Systems  Constitutional:  Positive for malaise/fatigue. Negative for chills and fever.  HENT:  Negative for congestion, ear pain, sinus pain and sore throat.   Eyes:  Negative for blurred vision, double vision and pain.  Respiratory:  Negative for cough, shortness of breath and wheezing.   Cardiovascular:  Negative for chest pain, palpitations and leg swelling.  Gastrointestinal:  Negative for abdominal pain, constipation, diarrhea, nausea and vomiting.  Genitourinary:  Negative for dysuria, frequency and urgency.  Musculoskeletal:  Negative for back pain, joint pain and myalgias.  Skin:  Positive for rash.  Neurological:  Negative for dizziness, tingling, focal weakness and headaches.  Psychiatric/Behavioral:  Negative for depression. The patient is nervous/anxious.        Objective:    BP 110/76 (BP Location: Left Arm, Patient Position: Sitting, Cuff Size: Normal)   Pulse 62   Temp 97.6 F (36.4 C)  (Temporal)   Ht 5\' 7"  (1.702 m)   Wt 178 lb (80.7 kg)   SpO2 98%   Breastfeeding No   BMI 27.88 kg/m  BP Readings from Last 3 Encounters:  08/22/23 110/76  08/22/23 102/72  07/05/23 96/62   Wt Readings from Last 3 Encounters:  08/22/23 178 lb (80.7 kg)  08/22/23 178 lb (80.7 kg)  07/05/23 180 lb (81.6 kg)    Physical Exam Constitutional:      General: She is not in acute distress.  Appearance: She is not ill-appearing.  HENT:     Right Ear: Tympanic membrane, ear canal and external ear normal.     Left Ear: Tympanic membrane, ear canal and external ear normal.     Nose: Nose normal.     Mouth/Throat:     Mouth: Mucous membranes are moist.     Pharynx: Oropharynx is clear.  Eyes:     Extraocular Movements: Extraocular movements intact.     Conjunctiva/sclera: Conjunctivae normal.     Pupils: Pupils are equal, round, and reactive to light.  Neck:     Thyroid: No thyroid mass, thyromegaly or thyroid tenderness.  Cardiovascular:     Rate and Rhythm: Normal rate and regular rhythm.     Pulses: Normal pulses.     Heart sounds: Normal heart sounds.  Pulmonary:     Effort: Pulmonary effort is normal.     Breath sounds: Normal breath sounds.  Abdominal:     General: Bowel sounds are normal.     Palpations: Abdomen is soft.     Tenderness: There is no abdominal tenderness. There is no right CVA tenderness, left CVA tenderness, guarding or rebound.  Musculoskeletal:        General: Normal range of motion.     Cervical back: Normal range of motion and neck supple. No tenderness.     Right lower leg: No edema.     Left lower leg: No edema.  Lymphadenopathy:     Cervical: No cervical adenopathy.  Skin:    General: Skin is warm and dry.     Findings: Rash present. No lesion.     Comments: Small patch of dry skin beneath left eye. No erythema, crusting, or induration.   Neurological:     General: No focal deficit present.     Mental Status: She is alert and oriented to  person, place, and time.     Cranial Nerves: No cranial nerve deficit.     Sensory: No sensory deficit.     Motor: No weakness.     Gait: Gait normal.  Psychiatric:        Mood and Affect: Mood normal.        Behavior: Behavior normal.        Thought Content: Thought content normal.      Results for orders placed or performed in visit on 08/22/23  Folate  Result Value Ref Range   Folate >24.2 >5.9 ng/mL  VITAMIN D 25 Hydroxy (Vit-D Deficiency, Fractures)  Result Value Ref Range   VITD 29.46 (L) 30.00 - 100.00 ng/mL  Vitamin B12  Result Value Ref Range   Vitamin B-12 495 211 - 911 pg/mL  Ferritin  Result Value Ref Range   Ferritin 27.2 10.0 - 291.0 ng/mL  T4, free  Result Value Ref Range   Free T4 0.80 0.60 - 1.60 ng/dL  Lipid panel  Result Value Ref Range   Cholesterol 177 0 - 200 mg/dL   Triglycerides 95.2 0.0 - 149.0 mg/dL   HDL 84.13 >24.40 mg/dL   VLDL 10.2 0.0 - 72.5 mg/dL   LDL Cholesterol 366 (H) 0 - 99 mg/dL   Total CHOL/HDL Ratio 3    NonHDL 113.91   TSH  Result Value Ref Range   TSH 1.44 0.35 - 5.50 uIU/mL  Comprehensive metabolic panel  Result Value Ref Range   Sodium 139 135 - 145 mEq/L   Potassium 4.3 3.5 - 5.1 mEq/L   Chloride 105 96 - 112 mEq/L  CO2 25 19 - 32 mEq/L   Glucose, Bld 97 70 - 99 mg/dL   BUN 13 6 - 23 mg/dL   Creatinine, Ser 2.95 0.40 - 1.20 mg/dL   Total Bilirubin 0.5 0.2 - 1.2 mg/dL   Alkaline Phosphatase 40 39 - 117 U/L   AST 18 0 - 37 U/L   ALT 13 0 - 35 U/L   Total Protein 7.4 6.0 - 8.3 g/dL   Albumin 4.5 3.5 - 5.2 g/dL   GFR 621.30 >86.57 mL/min   Calcium 8.8 8.4 - 10.5 mg/dL  CBC with Differential/Platelet  Result Value Ref Range   WBC 8.2 4.0 - 10.5 K/uL   RBC 4.51 3.87 - 5.11 Mil/uL   Hemoglobin 14.7 12.0 - 15.0 g/dL   HCT 84.6 96.2 - 95.2 %   MCV 97.7 78.0 - 100.0 fl   MCHC 33.4 30.0 - 36.0 g/dL   RDW 84.1 32.4 - 40.1 %   Platelets 313.0 150.0 - 400.0 K/uL   Neutrophils Relative % 68.7 43.0 - 77.0 %    Lymphocytes Relative 24.0 12.0 - 46.0 %   Monocytes Relative 5.6 3.0 - 12.0 %   Eosinophils Relative 1.3 0.0 - 5.0 %   Basophils Relative 0.4 0.0 - 3.0 %   Neutro Abs 5.6 1.4 - 7.7 K/uL   Lymphs Abs 2.0 0.7 - 4.0 K/uL   Monocytes Absolute 0.5 0.1 - 1.0 K/uL   Eosinophils Absolute 0.1 0.0 - 0.7 K/uL   Basophils Absolute 0.0 0.0 - 0.1 K/uL      Assessment & Plan:    Routine Health Maintenance and Physical Exam  Problem List Items Addressed This Visit     Fatigue    Check labs for underlying etiology. Prolonged period. R/o anemia      Relevant Orders   CBC with Differential/Platelet (Completed)   Comprehensive metabolic panel (Completed)   TSH (Completed)   T4, free (Completed)   Ferritin (Completed)   Folate (Completed)   Vitamin B12 (Completed)   VITAMIN D 25 Hydroxy (Vit-D Deficiency, Fractures) (Completed)   GAD (generalized anxiety disorder)    Doing fairly well on citalopram 20 mg      Relevant Medications   citalopram (CELEXA) 20 MG tablet   History of postpartum depression   Relevant Medications   citalopram (CELEXA) 20 MG tablet   Pure hypercholesterolemia    Check fasting lipids and follow up. Healthy diet and exercise.       Relevant Orders   Lipid panel (Completed)   Rash of face    Unknown etiology. Try Aquaphor then hydrocortisone. Follow up with dermatology if not improving.       Other Visit Diagnoses     Encounter for general adult medical examination with abnormal findings    -  Primary   Needs flu shot       Prolonged periods       Relevant Orders   TSH (Completed)   T4, free (Completed)   Need for influenza vaccination       Relevant Orders   Flu vaccine trivalent PF, 6mos and older(Flulaval,Afluria,Fluarix,Fluzone) (Completed)      Preventive health care reviewed.  Upcoming appt with OB/GYN. Also sees fertility specialist. Counseling on healthy lifestyle including diet and exercise.  Recommend regular dental and eye exams.   Immunizations reviewed.  Discussed safety.   Return in about 6 months (around 02/20/2024).     Hetty Blend, NP-C

## 2023-09-13 ENCOUNTER — Ambulatory Visit: Payer: 59 | Admitting: Sports Medicine

## 2023-09-25 ENCOUNTER — Encounter: Payer: Self-pay | Admitting: Family Medicine

## 2023-09-27 ENCOUNTER — Ambulatory Visit: Payer: 59 | Admitting: Family Medicine

## 2023-09-27 ENCOUNTER — Encounter: Payer: Self-pay | Admitting: Family Medicine

## 2023-09-27 VITALS — BP 102/64 | HR 60 | Temp 97.8°F | Ht 67.0 in | Wt 184.0 lb

## 2023-09-27 DIAGNOSIS — E559 Vitamin D deficiency, unspecified: Secondary | ICD-10-CM | POA: Insufficient documentation

## 2023-09-27 DIAGNOSIS — H6122 Impacted cerumen, left ear: Secondary | ICD-10-CM

## 2023-09-27 NOTE — Progress Notes (Signed)
Subjective:  Jasmine Bullock is a 43 y.o. female who presents for a 2-3 wk hx of left ear feeling full, clogged, and decreased hearing. Hx of cerumen impaction. She has been using a Q tip.   Denies fever, chills, dizziness, headache, congestion, sneezing, rhinorrhea, ST, cough.     ROS as in subjective.   Objective: Vitals:   09/27/23 1028  BP: 102/64  Pulse: 60  Temp: 97.8 F (36.6 C)  SpO2: 98%    General appearance: Alert, WD/WN, no distress                             Skin: warm, no rash                           Head: no sinus tenderness                            Eyes: conjunctiva normal, corneas clear, PERRLA                            Ears: Left ear canal with cerumen impaction.  Normal-appearing right TM and canal.                          Nose: septum midline, no drainage              Mouth/throat: MMM, tongue normal                           Neck: supple, no adenopathy, no thyromegaly, nontender                    Lungs: normal respirations       Assessment: Impacted cerumen of left ear    Plan: Cerumen impaction on the left ear.  Ear lavage performed by CMA with patient's consent.  She tolerated it well and symptoms resolved post lavage.  Normal left TM post lavage.

## 2023-11-05 ENCOUNTER — Encounter: Payer: Self-pay | Admitting: Family Medicine

## 2023-11-05 ENCOUNTER — Ambulatory Visit: Payer: Self-pay | Admitting: Family Medicine

## 2023-11-05 NOTE — Telephone Encounter (Signed)
 Chief Complaint: ABD distension Symptoms: RLQ pain when pushed on Frequency: started 2 days ago, upper ABD pain has resolved, Now exp RLQ pain Pertinent Negatives: Patient denies fever, diarrhea, others in the household sick, pregnancy, constipation, pain without palpation  Disposition: [] ED /[] Urgent Care (no appt availability in office) / [x] Appointment(In office/virtual)/ []  [facility]/ [] Home Care/ [x] Refused Recommended Disposition /[] [facility]/ []  Follow-up with PCP  Additional Notes: Pt was seen in the ED yesterday. Was not given Bullock dx. Pt states they controlled her pain, but her stomach still does not feel right. Pt states that her entire stomach is distended. Denies pain, unless palpated, then describes pressure. LMP 2/7. Pt states that she is not constipated that she has been before and this is not the same, that she also had Bullock large BM this morning. Pt states that the BM was hard. Denies SOB at this time. Denies fever. Pt states that she has never had this pain before. Still has appendic's.  Offered pt appt today, pt declined, states that she will go to the ER as she is worried about her appendic's.   Copied from CRM [id number]. Topic: Clinical - Red Word Triage >> Nov 05, 2023  9:20 AM Jasmine Bullock wrote: Red Word that prompted transfer to Nurse Triage: Patient seen at [facility] for abdominal pain on 2/23, still experiencing pain when she presses on her stomach and swelling of her stomach as well. Reason for Disposition  [1] MODERATE pain (e.g., interferes with normal activities) AND [2] pain comes and goes (cramps) AND [3] present > 24 hours  (Exception: Pain with Vomiting or Diarrhea - see that Guideline.)  Answer Assessment - Initial Assessment Questions 1. LOCATION: "Where does it hurt?"      Mostly hurts RLQ, tender all over, worse in RLQ 2. RADIATION: "Does the pain shoot anywhere else?" (e.g., chest, back)     denies 3. ONSET: "When did the pain begin?"  (e.g., minutes, hours or days ago)      1800 2 days ago 4. SUDDEN: "Gradual or sudden onset?"     Gradual 5. PATTERN "Does the pain come and go, or is it constant?"    - If it comes and goes: "How long does it last?" "Do you have pain now?"     (Note: Comes and goes means the pain is intermittent. It goes away completely between bouts.)    - If constant: "Is it getting better, staying the same, or getting worse?"      (Note: Constant means the pain never goes away completely; most serious pain is constant and gets worse.)      Currently the more mild pain in stomach, dull pain, only occurs with palpation of abd 6. SEVERITY: "How bad is the pain?"  (e.g., Scale 1-10; mild, moderate, or severe)    - MILD (1-3): Doesn't interfere with normal activities, abdomen soft and not tender to touch.     - MODERATE (4-7): Interferes with normal activities or awakens from sleep, abdomen tender to touch.     - SEVERE (8-10): Excruciating pain, doubled over, unable to do any normal activities.       With palpation, 5-6 7. RECURRENT SYMPTOM: "Have you ever had this type of stomach pain before?" If Yes, ask: "When was the last time?" and "What happened that time?"      States never had this pressure pain 8. CAUSE: "What do you think is causing the stomach pain?"  unsure 9. RELIEVING/AGGRAVATING FACTORS: "What makes it better or worse?" (e.g., antacids, bending or twisting motion, bowel movement)     BM earlier today, didn't seem to make Bullock difference, movement does not make it better or worse 10. OTHER SYMPTOMS: "Do you have any other symptoms?" (e.g., back pain, diarrhea, fever, urination pain, vomiting)       N/V, hard stool 11. PREGNANCY: "Is there any chance you are pregnant?" "When was your last menstrual period?"       denies  Protocols used: Abdominal Pain - [facility]

## 2023-11-05 NOTE — Telephone Encounter (Deleted)
 Copied from CRM [id number]. Topic: Clinical - Red Word Triage >> Nov 05, 2023  9:20 AM Isabell A wrote: Red Word that prompted transfer to Nurse Triage: Patient seen at [facility] for abdominal pain on 2/23, still experiencing pain when she presses on her stomach and swelling of her stomach as well.

## 2023-11-05 NOTE — Telephone Encounter (Signed)
 Pt would like to know if you recommend GI referral?

## 2023-11-06 ENCOUNTER — Encounter: Payer: Self-pay | Admitting: Family Medicine

## 2023-11-06 ENCOUNTER — Other Ambulatory Visit: Payer: Self-pay

## 2023-11-06 ENCOUNTER — Ambulatory Visit (HOSPITAL_COMMUNITY)
Admission: EM | Admit: 2023-11-06 | Discharge: 2023-11-07 | Disposition: A | Discharge status: Home / Self Care | Payer: 59 | Attending: Surgery | Admitting: Surgery

## 2023-11-06 ENCOUNTER — Telehealth: Payer: Self-pay

## 2023-11-06 ENCOUNTER — Ambulatory Visit (INDEPENDENT_AMBULATORY_CARE_PROVIDER_SITE_OTHER): Payer: 59 | Admitting: Family Medicine

## 2023-11-06 ENCOUNTER — Ambulatory Visit
Admission: RE | Admit: 2023-11-06 | Discharge: 2023-11-06 | Disposition: A | Discharge status: Home / Self Care | Payer: 59 | Source: Ambulatory Visit | Attending: Family Medicine | Admitting: Family Medicine

## 2023-11-06 ENCOUNTER — Encounter (HOSPITAL_COMMUNITY): Payer: Self-pay | Admitting: Emergency Medicine

## 2023-11-06 VITALS — BP 100/62 | HR 60 | Temp 97.8°F | Ht 67.0 in | Wt 182.0 lb

## 2023-11-06 DIAGNOSIS — K3589 Other acute appendicitis without perforation or gangrene: Secondary | ICD-10-CM | POA: Insufficient documentation

## 2023-11-06 DIAGNOSIS — R1031 Right lower quadrant pain: Secondary | ICD-10-CM | POA: Diagnosis not present

## 2023-11-06 DIAGNOSIS — J45909 Unspecified asthma, uncomplicated: Secondary | ICD-10-CM | POA: Insufficient documentation

## 2023-11-06 DIAGNOSIS — R10823 Right lower quadrant rebound abdominal tenderness: Secondary | ICD-10-CM | POA: Diagnosis not present

## 2023-11-06 DIAGNOSIS — F32A Depression, unspecified: Secondary | ICD-10-CM | POA: Diagnosis not present

## 2023-11-06 DIAGNOSIS — R112 Nausea with vomiting, unspecified: Secondary | ICD-10-CM

## 2023-11-06 DIAGNOSIS — K219 Gastro-esophageal reflux disease without esophagitis: Secondary | ICD-10-CM | POA: Diagnosis not present

## 2023-11-06 DIAGNOSIS — F419 Anxiety disorder, unspecified: Secondary | ICD-10-CM | POA: Diagnosis not present

## 2023-11-06 DIAGNOSIS — Z87891 Personal history of nicotine dependence: Secondary | ICD-10-CM | POA: Diagnosis not present

## 2023-11-06 LAB — CBC
HCT: 40 % (ref 36.0–46.0)
Hemoglobin: 13.9 g/dL (ref 12.0–15.0)
MCH: 33.2 pg (ref 26.0–34.0)
MCHC: 34.8 g/dL (ref 30.0–36.0)
MCV: 95.5 fL (ref 80.0–100.0)
Platelets: 270 10*3/uL (ref 150–400)
RBC: 4.19 MIL/uL (ref 3.87–5.11)
RDW: 11.9 % (ref 11.5–15.5)
WBC: 9.4 10*3/uL (ref 4.0–10.5)
nRBC: 0 % (ref 0.0–0.2)

## 2023-11-06 LAB — CBC WITH DIFFERENTIAL/PLATELET
Basophils Absolute: 0 10*3/uL (ref 0.0–0.1)
Basophils Relative: 0.3 % (ref 0.0–3.0)
Eosinophils Absolute: 0.1 10*3/uL (ref 0.0–0.7)
Eosinophils Relative: 1 % (ref 0.0–5.0)
HCT: 41.6 % (ref 36.0–46.0)
Hemoglobin: 14.2 g/dL (ref 12.0–15.0)
Lymphocytes Relative: 18.9 % (ref 12.0–46.0)
Lymphs Abs: 1.7 10*3/uL (ref 0.7–4.0)
MCHC: 34.1 g/dL (ref 30.0–36.0)
MCV: 96.8 fL (ref 78.0–100.0)
Monocytes Absolute: 0.5 10*3/uL (ref 0.1–1.0)
Monocytes Relative: 5.6 % (ref 3.0–12.0)
Neutro Abs: 6.5 10*3/uL (ref 1.4–7.7)
Neutrophils Relative %: 74.2 % (ref 43.0–77.0)
Platelets: 269 10*3/uL (ref 150.0–400.0)
RBC: 4.3 Mil/uL (ref 3.87–5.11)
RDW: 12.2 % (ref 11.5–15.5)
WBC: 8.8 10*3/uL (ref 4.0–10.5)

## 2023-11-06 LAB — HCG, SERUM, QUALITATIVE: Preg, Serum: NEGATIVE

## 2023-11-06 LAB — COMPREHENSIVE METABOLIC PANEL
ALT: 9 U/L (ref 0–35)
ALT: 13 U/L (ref 0–44)
AST: 15 U/L (ref 0–37)
AST: 16 U/L (ref 15–41)
Albumin: 4.1 g/dL (ref 3.5–5.2)
Albumin: 3.6 g/dL (ref 3.5–5.0)
Alkaline Phosphatase: 37 U/L — ABNORMAL LOW (ref 39–117)
Alkaline Phosphatase: 37 U/L — ABNORMAL LOW (ref 38–126)
Anion gap: 8 (ref 5–15)
BUN: 12 mg/dL (ref 6–23)
BUN: 14 mg/dL (ref 6–20)
CO2: 26 meq/L (ref 19–32)
CO2: 22 mmol/L (ref 22–32)
Calcium: 8.5 mg/dL (ref 8.4–10.5)
Calcium: 9 mg/dL (ref 8.9–10.3)
Chloride: 106 meq/L (ref 96–112)
Chloride: 105 mmol/L (ref 98–111)
Creatinine, Ser: 0.69 mg/dL (ref 0.40–1.20)
Creatinine, Ser: 0.73 mg/dL (ref 0.44–1.00)
GFR, Estimated: >60 mL/min (ref >60)
GFR: 106.83 mL/min (ref >60.00)
Glucose, Bld: 93 mg/dL (ref 70–99)
Glucose, Bld: 112 mg/dL — ABNORMAL HIGH (ref 70–99)
Potassium: 4.6 meq/L (ref 3.5–5.1)
Potassium: 4 mmol/L (ref 3.5–5.1)
Sodium: 138 meq/L (ref 135–145)
Sodium: 135 mmol/L (ref 135–145)
Total Bilirubin: 0.4 mg/dL (ref 0.2–1.2)
Total Bilirubin: 0.5 mg/dL (ref 0.0–1.2)
Total Protein: 7 g/dL (ref 6.0–8.3)
Total Protein: 6.6 g/dL (ref 6.5–8.1)

## 2023-11-06 LAB — POC URINALSYSI DIPSTICK (AUTOMATED)
Bilirubin, UA: NEGATIVE
Blood, UA: NEGATIVE
Glucose, UA: NEGATIVE
Ketones, UA: NEGATIVE
Leukocytes, UA: NEGATIVE
Nitrite, UA: NEGATIVE
Protein, UA: NEGATIVE
Spec Grav, UA: 1.02 (ref 1.010–1.025)
Urobilinogen, UA: 0.2 U/dL
pH, UA: 6 (ref 5.0–8.0)

## 2023-11-06 LAB — POCT URINE PREGNANCY: Preg Test, Ur: NEGATIVE

## 2023-11-06 LAB — LIPASE: Lipase: 17 U/L (ref 11.0–59.0)

## 2023-11-06 NOTE — Progress Notes (Signed)
 Subjective:     Patient ID: Jasmine Bullock, female    DOB: 1981/09/08, 43 y.o.   MRN: [id number]  Chief Complaint  Patient presents with   Hospitalization Follow-up    11/03/23 for epigastric pain, possible GI referral     HPI  History of Present Illness         Here with c/o RLQ pain, N/V.  She was in [place] and went to the ED for RUQ abdominal pain, N/V. She was vomiting bile.   She was having epigastric pain and describes pain as indigestion. Pepcid and Tylenol did not help. She also took [employer].   At the ED she was given IV fluids, Toradol, and Zofran.  RUQ [place] negative for acute chole  Her WBC was 17.5   States GERD is getting worse in general. She takes Pepcid prn.  Drinks coffee in the morning and 2 shots of espresso but has not had coffee the past 2 days.   She has had similar episodes and typically feels better after vomiting. This past weekend she was vomiting and did not feel better.   Noticed abdominal pain moved from RUQ to RLQ yesterday.    She had a "compassion transfer" of an embryo via IVF in late January. She had a normal period in February.      There are no preventive care reminders to display for this patient.  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   GERD (gastroesophageal reflux disease) 01/28/2020   Plantar fasciitis    left foot   Postpartum hemorrhage 1348cc 04/16/2022   Pregnancy 09/19/2021    Past Surgical History:  Procedure Laterality Date   L wrist surgery     PLANTAR FASCIA RELEASE Left 01/27/2020   WISDOM  TOOTH EXTRACTION      Family History  Problem Relation Age of Onset   Hypertension Mother    Ulcers Father    Hearing loss Father    Healthy Sister    Healthy Son    Healthy Son    Ovarian cancer Maternal Grandmother    Cancer Maternal Grandmother    Alcohol abuse Maternal Grandfather    Hypertension Paternal Grandmother    Hyperlipidemia Paternal Grandmother    Diabetes Paternal Grandfather    Heart disease Paternal Grandfather    Hearing loss Paternal Grandfather    Hypertension Paternal Grandfather    Stroke Paternal Grandfather    Colon cancer Neg Hx    Rectal cancer Neg Hx    Esophageal cancer Neg Hx    Breast cancer Neg Hx     Social History   Socioeconomic History   Marital status: Married    Spouse name: Not on file   Number of children: 2   Years of education: Not on file   Highest education level: Bachelor's degree (e.g., BA, AB, BS)  Occupational History   Occupation: dental hygentist   Tobacco Use   Smoking status: Former    Current packs/day: 0.00    Average packs/day: 0.3  packs/day for 5.2 years (1.3 ttl pk-yrs)    Types: Cigarettes    Start date: 02/10/1999    Quit date: 02/10/2004    Years since quitting: 19.7   Smokeless tobacco: Never   Tobacco comments:    Light social smoker in college  Vaping Use   Vaping status: Never Used  Substance and Sexual Activity   Alcohol use: Yes    Comment: Rarely   Drug use: No   Sexual activity: Yes    Partners: Male    Birth control/protection: Condom, None    Comment: Delivered baby boy in August  Other Topics Concern   Not on file  Social History Narrative   Marital status: Married since 2009   [id number]   Social Drivers of Health   Financial Resource Strain: Low Risk  (09/26/2023)   Overall Financial Resource Strain (CARDIA)    Difficulty of Paying Living Expenses: Not hard at all  Food Insecurity: No Food Insecurity (09/26/2023)   Hunger Vital Sign    Worried About Running Out of Food in the Last  Year: Never true    Ran Out of Food in the Last Year: Never true  Transportation Needs: No Transportation Needs (09/26/2023)   PRAPARE - Administrator, Civil Service (Medical): No    Lack of Transportation (Non-Medical): No  Physical Activity: Insufficiently Active (09/26/2023)   Exercise Vital Sign    Days of Exercise per Week: 3 days    Minutes of Exercise per Session: 40 min  Stress: No Stress Concern Present (09/26/2023)   [name] of Occupational Health - Occupational Stress Questionnaire    Feeling of Stress : Only a little  Social Connections: Socially Integrated (09/26/2023)   Social Connection and Isolation Panel [NHANES]    Frequency of Communication with Friends and Family: More than three times a week    Frequency of Social Gatherings with Friends and Family: More than three times a week    Attends Religious Services: More than 4 times per year    Active Member of [employer] or Organizations: Yes    Attends Engineer, structural: More than 4 times per year    Marital Status: Married  Catering manager Violence: Not on file    Outpatient Medications Prior to Visit  Medication Sig Dispense Refill   BIOTIN PO Take by mouth.     CALCIUM PO Take by mouth.     Cholecalciferol (VITAMIN D-3 PO) Take by mouth.     citalopram (CELEXA) 20 MG tablet Take 1 tablet (20 mg total) by mouth daily. 30 tablet 3   famotidine (PEPCID) 10 MG tablet Take 10 mg by mouth 2 (two) times daily.     Glucosamine HCl (GLUCOSAMINE PO) Take by mouth.     Prenatal Vit-Fe Fumarate-FA (PRENATAL PO) daily. (Patient not taking: Reported on 11/06/2023)     Probiotic Product (PROBIOTIC PO) Take by mouth. (Patient not taking: Reported on 11/06/2023)     No facility-administered medications prior to visit.    Allergies  Allergen Reactions   Cat Dander Other (See Comments)   Dust Mite Extract Other (See Comments)    Review of Systems  Constitutional:  Negative for chills and fever.   Respiratory:  Negative for shortness of breath.   Cardiovascular:  Negative for chest pain, palpitations and leg swelling.  Gastrointestinal:  Positive for abdominal pain, nausea and vomiting. Negative for constipation and diarrhea.  Genitourinary:  Negative for dysuria, flank pain, frequency, hematuria and urgency.  Musculoskeletal:  Negative for back pain.  Neurological:  Negative for dizziness, focal weakness and headaches.       Objective:    Physical Exam Constitutional:      General: She is not in acute distress.    Appearance: She is ill-appearing.  HENT:     Mouth/Throat:     Mouth: Mucous membranes are moist.     Pharynx: Oropharynx is clear.  Eyes:     Extraocular Movements: Extraocular movements intact.     Conjunctiva/sclera: Conjunctivae normal.  Cardiovascular:     Rate and Rhythm: Normal rate and regular rhythm.  Pulmonary:     Effort: Pulmonary effort is normal.     Breath sounds: Normal breath sounds.  Abdominal:     General: Bowel sounds are normal. There is no distension.     Palpations: Abdomen is soft.     Tenderness: There is abdominal tenderness in the right lower quadrant. There is rebound. There is no right CVA tenderness, left CVA tenderness or guarding. Positive signs include McBurney's sign. Negative signs include Murphy's sign and psoas sign.  Musculoskeletal:        General: Normal range of motion.     Cervical back: Normal range of motion and neck supple.     Right lower leg: No edema.     Left lower leg: No edema.  Skin:    General: Skin is warm and dry.  Neurological:     General: No focal deficit present.     Mental Status: She is alert and oriented to person, place, and time.     Motor: No weakness.     Coordination: Coordination normal.     Gait: Gait normal.  Psychiatric:        Mood and Affect: Mood normal.        Behavior: Behavior normal.        Thought Content: Thought content normal.      BP 100/62 (BP Location: Left Arm,  Patient Position: Sitting)   Pulse 60   Temp 97.8 F (36.6 C) (Temporal)   Ht 5\' 7"  (1.702 m)   Wt 182 lb (82.6 kg)   SpO2 99%   Breastfeeding No   BMI 28.51 kg/m  Wt Readings from Last 3 Encounters:  11/06/23 182 lb (82.6 kg)  09/27/23 184 lb (83.5 kg)  08/22/23 178 lb (80.7 kg)       Assessment & Plan:   Problem List Items Addressed This Visit   None Visit Diagnoses       Right lower quadrant abdominal tenderness with rebound tenderness    -  Primary   Relevant Orders   CT ABDOMEN PELVIS WO CONTRAST   hCG, serum, qualitative   CBC with Differential/Platelet   Comprehensive metabolic panel   Lipase     Right lower quadrant abdominal pain       Relevant Orders   POCT Urinalysis Dipstick (Automated) (Completed)   POCT urine pregnancy (Completed)     Gastroesophageal reflux disease, unspecified whether esophagitis present         Nausea and vomiting, unspecified vomiting type       Relevant Orders   POCT Urinalysis Dipstick (Automated) (Completed)   POCT urine pregnancy (Completed)   CT ABDOMEN PELVIS WO CONTRAST   CBC with Differential/Platelet   Comprehensive metabolic panel   Lipase      She is here with RLQ, nausea and vomiting. Reviewed notes and results from her ED visit while in [place] this weekend.  WBC 17.5 Ultrasound negative for acute cholecystitis.  She was discharged home with Zofran but she was unable to pick up the prescription. She has not vomited today or yesterday.  Poor appetite and intake I will order a stat CT abdomen pelvis to rule out acute appendicitis or right ovarian abnormality Urine pregnancy test is negative. Check stat CBC, CMP and lipase Start precautions that if she has any worsening symptoms that she will call 911 or go to the emergency department.     I am having Jasmine Phenix. Pizzolato maintain her famotidine, BIOTIN PO, Glucosamine HCl (GLUCOSAMINE PO), Probiotic Product (PROBIOTIC PO), Cholecalciferol (VITAMIN D-3 PO), CALCIUM PO,  Prenatal Vit-Fe Fumarate-FA (PRENATAL PO), and citalopram.  No orders of the defined types were placed in this encounter.

## 2023-11-06 NOTE — ED Triage Notes (Signed)
 Pt c/o being diagnosed with appendicitis earlier today by CT and sent by her PCP.

## 2023-11-06 NOTE — Patient Instructions (Addendum)
 Please go downstairs for labs.  Your urine pregnancy test is negative. Your urine is negative for an infection.  Avoid eating or drinking anything for the next hour or 2 until we find out if you get your CT scan done today.  If you are getting worse at all, fever, chills, vomiting or worsening pain then please go to the emergency department or call 911.

## 2023-11-06 NOTE — ED Provider Triage Note (Signed)
 Emergency Medicine Provider Triage Evaluation Note  Jasmine Bullock , a 43 y.o. female  was evaluated in triage.  Pt complains of right lower quadrant abdominal pain.  Was seen at PCPs office earlier today where she was noted to have acute uncomplicated appendicitis on CT scan.  Chart reviewed, patient had labs drawn earlier today.  No leukocytosis noted.  Patient will have labs redrawn.  Will also collect lactic acid, blood cultures.  Review of Systems  Positive:  Negative:   Physical Exam  BP 117/76   Pulse 62   Temp 98.8 F (37.1 C)   Resp 18   Ht 5\' 7"  (1.702 m)   Wt 82.6 kg   LMP 10/19/2023   SpO2 100%   BMI 28.52 kg/m  Gen:   Awake, no distress   Resp:  Normal effort  MSK:   Moves extremities without difficulty  Other:    Medical Decision Making  Medically screening exam initiated at 8:26 PM.  Appropriate orders placed.  Jasmine Bullock was informed that the remainder of the evaluation will be completed by another provider, this initial triage assessment does not replace that evaluation, and the importance of remaining in the ED until their evaluation is complete.     Al Decant, PA-C 11/06/23 2026

## 2023-11-06 NOTE — Telephone Encounter (Signed)
 Noted critical finding acute appendicitis on imaging done stat. Please call and inform patient to go to ER emergently for assessment and treatment.

## 2023-11-06 NOTE — Progress Notes (Signed)
 Called and spoke with patient. She was advised to go to the [facility] ED due to having acute appendicitis on CT today. She agrees to go.  Please check on her Wednesday.

## 2023-11-06 NOTE — Telephone Encounter (Signed)
 Diane with [facility] called with a STAT read for pts findings in her Abdomen Pelvis CT w/o Contrast of the following: 1. Acute appendicitis, without evidence of acute rupture or abscess. 2. Punctate bilateral nonobstructive nephrolithiasis. 3. Stable punctate calcification inferiorly along the urinary bladder wall, no change from last year, probably not loose within the urinary bladder given the non dependent position. 4. Stable subtle hypodensity anteriorly in the lateral segment left hepatic lobe potentially from focal steatosis or a small cyst or similar benign lesion. No change over the past year.

## 2023-11-07 ENCOUNTER — Emergency Department (HOSPITAL_COMMUNITY): Payer: 59 | Admitting: Certified Registered"

## 2023-11-07 ENCOUNTER — Emergency Department (HOSPITAL_BASED_OUTPATIENT_CLINIC_OR_DEPARTMENT_OTHER): Payer: 59 | Admitting: Certified Registered"

## 2023-11-07 ENCOUNTER — Other Ambulatory Visit: Payer: Self-pay

## 2023-11-07 ENCOUNTER — Encounter (HOSPITAL_COMMUNITY): Payer: Self-pay

## 2023-11-07 ENCOUNTER — Encounter (HOSPITAL_COMMUNITY)
Admission: EM | Disposition: A | Discharge status: Home / Self Care | Payer: Self-pay | Source: Home / Self Care | Attending: Emergency Medicine

## 2023-11-07 DIAGNOSIS — K3532 Acute appendicitis with perforation and localized peritonitis, without abscess: Secondary | ICD-10-CM | POA: Diagnosis not present

## 2023-11-07 HISTORY — PX: LAPAROSCOPIC APPENDECTOMY: SHX408

## 2023-11-07 SURGERY — APPENDECTOMY, LAPAROSCOPIC
Anesthesia: General | Site: Abdomen

## 2023-11-07 MED ORDER — KETOROLAC TROMETHAMINE 30 MG/ML IJ SOLN
INTRAMUSCULAR | Status: AC
  Filled 2023-11-07: qty 1

## 2023-11-07 MED ORDER — OXYCODONE HCL 5 MG/5ML PO SOLN: 5.0000 mg | Freq: Once | ORAL | Status: DC | PRN

## 2023-11-07 MED ORDER — MEPERIDINE HCL 25 MG/ML IJ SOLN: 6.2500 mg | INTRAMUSCULAR | Status: DC | PRN

## 2023-11-07 MED ORDER — SUGAMMADEX SODIUM 200 MG/2ML IV SOLN
INTRAVENOUS | Status: AC
  Filled 2023-11-07: qty 2

## 2023-11-07 MED ORDER — ACETAMINOPHEN 10 MG/ML IV SOLN
INTRAVENOUS | Status: AC
  Filled 2023-11-07: qty 100

## 2023-11-07 MED ORDER — SUGAMMADEX SODIUM 200 MG/2ML IV SOLN
INTRAVENOUS | Status: DC | PRN
  Administered 2023-11-07: 200 mg via INTRAVENOUS

## 2023-11-07 MED ORDER — FENTANYL CITRATE (PF) 250 MCG/5ML IJ SOLN
INTRAMUSCULAR | Status: DC | PRN
  Administered 2023-11-07: 100 ug via INTRAVENOUS
  Administered 2023-11-07 (×3): 50 ug via INTRAVENOUS

## 2023-11-07 MED ORDER — 0.9 % SODIUM CHLORIDE (POUR BTL) OPTIME
TOPICAL | Status: DC | PRN
  Administered 2023-11-07: 1000 mL

## 2023-11-07 MED ORDER — DEXAMETHASONE SODIUM PHOSPHATE 10 MG/ML IJ SOLN
INTRAMUSCULAR | Status: AC
  Filled 2023-11-07: qty 1

## 2023-11-07 MED ORDER — CHLORHEXIDINE GLUCONATE 0.12 % MT SOLN: 15.0000 mL | Freq: Once | OROMUCOSAL | Status: AC

## 2023-11-07 MED ORDER — KETOROLAC TROMETHAMINE 30 MG/ML IJ SOLN
INTRAMUSCULAR | Status: DC | PRN
Start: 2023-11-07 — End: 2023-11-07
  Administered 2023-11-07: 30 mg via INTRAVENOUS

## 2023-11-07 MED ORDER — ACETAMINOPHEN 500 MG PO TABS
1000.0000 mg | ORAL_TABLET | Freq: Once | ORAL | Status: AC
  Administered 2023-11-07: 1000 mg via ORAL
  Filled 2023-11-07: qty 2

## 2023-11-07 MED ORDER — BUPIVACAINE-EPINEPHRINE (PF) 0.25% -1:200000 IJ SOLN
INTRAMUSCULAR | Status: AC
  Filled 2023-11-07: qty 30

## 2023-11-07 MED ORDER — SODIUM CHLORIDE 0.9 % IR SOLN
Status: DC | PRN
  Administered 2023-11-07: 1000 mL

## 2023-11-07 MED ORDER — SODIUM CHLORIDE 0.9 % IV SOLN: 12.5000 mg | INTRAVENOUS | Status: DC | PRN

## 2023-11-07 MED ORDER — ROCURONIUM BROMIDE 10 MG/ML (PF) SYRINGE
PREFILLED_SYRINGE | INTRAVENOUS | Status: AC
  Filled 2023-11-07: qty 10

## 2023-11-07 MED ORDER — PROPOFOL 10 MG/ML IV BOLUS
INTRAVENOUS | Status: DC | PRN
  Administered 2023-11-07: 150 mg via INTRAVENOUS

## 2023-11-07 MED ORDER — ONDANSETRON HCL 4 MG/2ML IJ SOLN
INTRAMUSCULAR | Status: AC
  Filled 2023-11-07: qty 2

## 2023-11-07 MED ORDER — FENTANYL CITRATE (PF) 250 MCG/5ML IJ SOLN
INTRAMUSCULAR | Status: AC
  Filled 2023-11-07: qty 5

## 2023-11-07 MED ORDER — AMISULPRIDE (ANTIEMETIC) 5 MG/2ML IV SOLN
10.0000 mg | Freq: Once | INTRAVENOUS | Status: AC | PRN
  Administered 2023-11-07: 10 mg via INTRAVENOUS

## 2023-11-07 MED ORDER — SUCCINYLCHOLINE CHLORIDE 200 MG/10ML IV SOSY
PREFILLED_SYRINGE | INTRAVENOUS | Status: AC
  Filled 2023-11-07: qty 10

## 2023-11-07 MED ORDER — MIDAZOLAM HCL 2 MG/2ML IJ SOLN
INTRAMUSCULAR | Status: AC
  Filled 2023-11-07: qty 2

## 2023-11-07 MED ORDER — LIDOCAINE 2% (20 MG/ML) 5 ML SYRINGE
INTRAMUSCULAR | Status: AC
  Filled 2023-11-07: qty 5

## 2023-11-07 MED ORDER — METRONIDAZOLE 500 MG/100ML IV SOLN
500.0000 mg | Freq: Once | INTRAVENOUS | Status: AC
  Administered 2023-11-07: 500 mg via INTRAVENOUS
  Filled 2023-11-07: qty 100

## 2023-11-07 MED ORDER — PROPOFOL 10 MG/ML IV BOLUS
INTRAVENOUS | Status: AC
  Filled 2023-11-07: qty 20

## 2023-11-07 MED ORDER — CHLORHEXIDINE GLUCONATE 0.12 % MT SOLN
OROMUCOSAL | Status: AC
  Administered 2023-11-07: 15 mL via OROMUCOSAL
  Filled 2023-11-07: qty 15

## 2023-11-07 MED ORDER — LIDOCAINE 2% (20 MG/ML) 5 ML SYRINGE
INTRAMUSCULAR | Status: DC | PRN
  Administered 2023-11-07: 60 mg via INTRAVENOUS

## 2023-11-07 MED ORDER — ONDANSETRON HCL 4 MG/2ML IJ SOLN
INTRAMUSCULAR | Status: DC | PRN
  Administered 2023-11-07: 4 mg via INTRAVENOUS

## 2023-11-07 MED ORDER — DEXAMETHASONE SODIUM PHOSPHATE 10 MG/ML IJ SOLN
INTRAMUSCULAR | Status: DC | PRN
  Administered 2023-11-07: 10 mg via INTRAVENOUS

## 2023-11-07 MED ORDER — OXYCODONE HCL 5 MG PO TABS: 5.0000 mg | ORAL_TABLET | Freq: Once | ORAL | Status: DC | PRN

## 2023-11-07 MED ORDER — AMISULPRIDE (ANTIEMETIC) 5 MG/2ML IV SOLN
INTRAVENOUS | Status: AC
  Filled 2023-11-07: qty 4

## 2023-11-07 MED ORDER — ACETAMINOPHEN 10 MG/ML IV SOLN
1000.0000 mg | Freq: Once | INTRAVENOUS | Status: AC
  Administered 2023-11-07: 1000 mg via INTRAVENOUS

## 2023-11-07 MED ORDER — SUCCINYLCHOLINE CHLORIDE 200 MG/10ML IV SOSY
PREFILLED_SYRINGE | INTRAVENOUS | Status: DC | PRN
  Administered 2023-11-07: 120 mg via INTRAVENOUS

## 2023-11-07 MED ORDER — HYDROMORPHONE HCL 1 MG/ML IJ SOLN: 0.2500 mg | INTRAMUSCULAR | Status: DC | PRN

## 2023-11-07 MED ORDER — ROCURONIUM BROMIDE 10 MG/ML (PF) SYRINGE
PREFILLED_SYRINGE | INTRAVENOUS | Status: DC | PRN
  Administered 2023-11-07: 10 mg via INTRAVENOUS
  Administered 2023-11-07: 20 mg via INTRAVENOUS

## 2023-11-07 MED ORDER — OXYCODONE HCL 5 MG PO TABS
5.0000 mg | ORAL_TABLET | Freq: Four times a day (QID) | ORAL | 0 refills | Status: DC | PRN
Start: 2023-11-07 — End: 2023-12-05

## 2023-11-07 MED ORDER — BUPIVACAINE-EPINEPHRINE 0.25% -1:200000 IJ SOLN
INTRAMUSCULAR | Status: DC | PRN
  Administered 2023-11-07: 7 mL

## 2023-11-07 MED ORDER — MIDAZOLAM HCL 2 MG/2ML IJ SOLN
INTRAMUSCULAR | Status: DC | PRN
  Administered 2023-11-07: 2 mg via INTRAVENOUS

## 2023-11-07 MED ORDER — SODIUM CHLORIDE 0.9 % IV SOLN
2.0000 g | Freq: Once | INTRAVENOUS | Status: AC
  Administered 2023-11-07: 2 g via INTRAVENOUS
  Filled 2023-11-07: qty 20

## 2023-11-07 MED ORDER — LACTATED RINGERS IV SOLN: INTRAVENOUS | Status: DC

## 2023-11-07 MED ORDER — EPHEDRINE SULFATE (PRESSORS) 50 MG/ML IJ SOLN
INTRAMUSCULAR | Status: DC | PRN
  Administered 2023-11-07: 5 mg via INTRAVENOUS

## 2023-11-07 MED ORDER — ORAL CARE MOUTH RINSE: 15.0000 mL | Freq: Once | OROMUCOSAL | Status: AC

## 2023-11-07 SURGICAL SUPPLY — 42 items
BAG COUNTER SPONGE SURGICOUNT (BAG) ×1 IMPLANT
BENZOIN TINCTURE PRP APPL 2/3 (GAUZE/BANDAGES/DRESSINGS) ×1 IMPLANT
BLADE CLIPPER SURG (BLADE) IMPLANT
CANISTER SUCT 3000ML PPV (MISCELLANEOUS) IMPLANT
CHLORAPREP W/TINT 26 (MISCELLANEOUS) ×1 IMPLANT
COVER SURGICAL LIGHT HANDLE (MISCELLANEOUS) ×1 IMPLANT
CUTTER FLEX LINEAR 45M (STAPLE) ×1 IMPLANT
DRSG COVADERM PLUS 2X2 (GAUZE/BANDAGES/DRESSINGS) IMPLANT
DRSG TEGADERM 2-3/8X2-3/4 SM (GAUZE/BANDAGES/DRESSINGS) ×2 IMPLANT
DRSG TEGADERM 4X4.75 (GAUZE/BANDAGES/DRESSINGS) ×1 IMPLANT
ELECT REM PT RETURN 9FT ADLT (ELECTROSURGICAL) ×1 IMPLANT
ELECTRODE REM PT RTRN 9FT ADLT (ELECTROSURGICAL) ×1 IMPLANT
ENDOLOOP SUT PDS II 0 18 (SUTURE) IMPLANT
GAUZE SPONGE 2X2 8PLY STRL LF (GAUZE/BANDAGES/DRESSINGS) ×1 IMPLANT
GLOVE BIO SURGEON STRL SZ7 (GLOVE) ×1 IMPLANT
GLOVE BIOGEL PI IND STRL 7.5 (GLOVE) ×1 IMPLANT
GOWN STRL REUS W/ TWL LRG LVL3 (GOWN DISPOSABLE) ×3 IMPLANT
IRRIG SUCT STRYKERFLOW 2 WTIP (MISCELLANEOUS) ×1 IMPLANT
IRRIGATION SUCT STRKRFLW 2 WTP (MISCELLANEOUS) IMPLANT
KIT BASIN OR (CUSTOM PROCEDURE TRAY) ×1 IMPLANT
KIT TURNOVER KIT B (KITS) ×1 IMPLANT
NS IRRIG 1000ML POUR BTL (IV SOLUTION) ×1 IMPLANT
PAD ARMBOARD 7.5X6 YLW CONV (MISCELLANEOUS) ×2 IMPLANT
RELOAD STAPLE 45 3.5 BLU ETS (ENDOMECHANICALS) ×1 IMPLANT
RELOAD STAPLE TA45 3.5 REG BLU (ENDOMECHANICALS) ×1 IMPLANT
SCISSORS LAP 5X35 DISP (ENDOMECHANICALS) IMPLANT
SET TUBE SMOKE EVAC HIGH FLOW (TUBING) ×1 IMPLANT
SHEARS HARMONIC ACE PLUS 36CM (ENDOMECHANICALS) ×1 IMPLANT
SLEEVE Z-THREAD 5X100MM (TROCAR) ×1 IMPLANT
SPECIMEN JAR SMALL (MISCELLANEOUS) ×1 IMPLANT
STRIP CLOSURE SKIN 1/2X4 (GAUZE/BANDAGES/DRESSINGS) ×1 IMPLANT
SUT MNCRL AB 4-0 PS2 18 (SUTURE) ×1 IMPLANT
SUT VICRYL 0 UR6 27IN ABS (SUTURE) IMPLANT
SYS BAG RETRIEVAL 10MM (BASKET) ×1 IMPLANT
SYSTEM BAG RETRIEVAL 10MM (BASKET) ×1 IMPLANT
TOWEL GREEN STERILE (TOWEL DISPOSABLE) ×1 IMPLANT
TOWEL GREEN STERILE FF (TOWEL DISPOSABLE) ×1 IMPLANT
TRAY LAPAROSCOPIC MC (CUSTOM PROCEDURE TRAY) ×1 IMPLANT
TROCAR BALLN 12MMX100 BLUNT (TROCAR) ×1 IMPLANT
TROCAR Z-THREAD OPTICAL 5X100M (TROCAR) ×1 IMPLANT
WARMER LAPAROSCOPE (MISCELLANEOUS) ×1 IMPLANT
WATER STERILE IRR 1000ML POUR (IV SOLUTION) ×1 IMPLANT

## 2023-11-07 NOTE — Transfer of Care (Signed)
 Immediate Anesthesia Transfer of Care Note  Patient: Jasmine Bullock  Procedure(s) Performed: APPENDECTOMY LAPAROSCOPIC (Abdomen)  Patient Location: PACU  Anesthesia Type:General  Level of Consciousness: awake, alert , and oriented  Airway & Oxygen Therapy: Patient Spontanous Breathing and Patient connected to nasal cannula oxygen  Post-op Assessment: Report given to RN and Post -op Vital signs reviewed and stable  Post vital signs: Reviewed and stable  Last Vitals:  Vitals Value Taken Time  BP    Temp    Pulse 82 11/07/23 1011  Resp 18 11/07/23 1011  SpO2 96 % 11/07/23 1011  Vitals shown include unfiled device data.  Last Pain:  Vitals:   11/07/23 0829  TempSrc:   PainSc: 0-No pain         Complications: No notable events documented.

## 2023-11-07 NOTE — ED Provider Notes (Signed)
 [place] EMERGENCY DEPARTMENT AT [facility] Provider Note   CSN: [id number] Arrival date & time: 11/06/23  1944     History  Chief Complaint  Patient presents with   Abdominal Pain    Jasmine Bullock is a 43 y.o. female.  The history is provided by the patient and medical records.  Abdominal Pain  43 year old female with history of anxiety, asthma, GERD, depression, presenting to the ED for abnormal CT scan results.  Patient reports she actually started having abdominal pain on Friday while she was vacationing in [place] with her husband.  She was seen at a local ER there where she had an ultrasound to evaluate her gallbladder which was normal.  She was given pain and nausea medication along with IV fluids and felt better.  They returned home Sunday evening.  She followed up with PCP yesterday and had CT scan which showed appendicitis.  Pain currently controlled, still able to eat/drink normally.  No vomiting or fevers.  Denies prior abdominal surgeries.  Home Medications Prior to Admission medications   Medication Sig Start Date End Date Taking? Authorizing Provider  BIOTIN PO Take by mouth.    [provider]  CALCIUM PO Take by mouth.    [provider]  Cholecalciferol (VITAMIN D-3 PO) Take by mouth.    [provider]  citalopram (CELEXA) 20 MG tablet Take 1 tablet (20 mg total) by mouth daily. 08/22/23   Henson, Vickie L, NP-C  famotidine (PEPCID) 10 MG tablet Take 10 mg by mouth 2 (two) times daily.    [provider]  Glucosamine HCl (GLUCOSAMINE PO) Take by mouth.    [provider]  Prenatal Vit-Fe Fumarate-FA (PRENATAL PO) daily. Patient not taking: Reported on 11/06/2023    [provider]  Probiotic Product (PROBIOTIC PO) Take by mouth. Patient not taking: Reported on 11/06/2023    [provider]      Allergies    Cat dander and Dust mite extract    Review of Systems   Review of Systems   Gastrointestinal:  Positive for abdominal pain.  All other systems reviewed and are negative.   Physical Exam Updated Vital Signs BP 117/76   Pulse 62   Temp 98.8 F (37.1 C)   Resp 18   Ht 5\' 7"  (1.702 m)   Wt 82.6 kg   LMP 10/19/2023   SpO2 100%   BMI 28.52 kg/m   Physical Exam Vitals and nursing note reviewed.  Constitutional:      Appearance: She is well-developed.  HENT:     Head: Normocephalic and atraumatic.  Eyes:     Conjunctiva/sclera: Conjunctivae normal.     Pupils: Pupils are equal, round, and reactive to light.  Cardiovascular:     Rate and Rhythm: Normal rate and regular rhythm.     Heart sounds: Normal heart sounds.  Pulmonary:     Effort: Pulmonary effort is normal.     Breath sounds: Normal breath sounds.  Abdominal:     General: Bowel sounds are normal.     Palpations: Abdomen is soft.     Comments: Not exquisitely tender on palpation  Musculoskeletal:        General: Normal range of motion.     Cervical back: Normal range of motion.  Skin:    General: Skin is warm and dry.  Neurological:     Mental Status: She is alert and oriented to person, place, and time.  ED Results / Procedures / Treatments   Labs (all labs ordered are listed, but only abnormal results are displayed) Labs Reviewed  COMPREHENSIVE METABOLIC PANEL - Abnormal; Notable for the following components:      Result Value   Glucose, Bld 112 (*)    Alkaline Phosphatase 37 (*)    All other components within normal limits  CULTURE, BLOOD (ROUTINE X 2)  CULTURE, BLOOD (ROUTINE X 2)  CBC  I-STAT CG4 LACTIC ACID, ED    EKG None  Radiology CT ABDOMEN PELVIS WO CONTRAST Result Date: 11/06/2023 CLINICAL DATA:  Right lower quadrant abdominal pain over the last 5 days EXAM: CT ABDOMEN AND PELVIS WITHOUT CONTRAST TECHNIQUE: Multidetector CT imaging of the abdomen and pelvis was performed following the standard protocol without IV contrast. RADIATION DOSE REDUCTION: This exam  was performed according to the departmental dose-optimization program which includes automated exposure control, adjustment of the mA and/or kV according to patient size and/or use of iterative reconstruction technique. COMPARISON:  10/13/2022 FINDINGS: Lower chest: Unremarkable Hepatobiliary: Stable subtle hypodensity anteriorly in the lateral segment left hepatic lobe potentially from focal steatosis or a small cyst or similar benign lesion. No change over the past year. Pancreas: Unremarkable Spleen: Unremarkable Adrenals/Urinary Tract: Punctate bilateral nonobstructive nephrolithiasis. The largest visible stone is 3 mm in diameter in the left mid kidney. No hydronephrosis, hydroureter, or ureteral stone identified. Stable punctate calcification inferiorly along the urinary bladder wall, no change from last year, probably not loose within the urinary bladder given the non dependent position. Stomach/Bowel: Acute appendicitis is present, with appendiceal caliber thickened to 1 cm and surrounding mild inflammatory stranding. No extraluminal gas, abscess, or evidence of acute rupture at this time. Vascular/Lymphatic: Unremarkable Reproductive: 4 cm left adnexal cyst, image 58 series 2, internal density 19 Hounsfield units. No follow-up imaging recommended. Note: This recommendation does not apply to premenarchal patients and to those with increased risk (genetic, family history, elevated tumor markers or other high-risk factors) of ovarian cancer. Reference: JACR 2020 Feb; 17(2):248-254 Other: No supplemental non-categorized findings. Musculoskeletal: Unremarkable IMPRESSION: 1. Acute appendicitis, without evidence of acute rupture or abscess. 2. Punctate bilateral nonobstructive nephrolithiasis. 3. Stable punctate calcification inferiorly along the urinary bladder wall, no change from last year, probably not loose within the urinary bladder given the non dependent position. 4. Stable subtle hypodensity anteriorly  in the lateral segment left hepatic lobe potentially from focal steatosis or a small cyst or similar benign lesion. No change over the past year. Electronically Signed   By: Gaylyn Rong M.D.   On: 11/06/2023 15:36    Procedures Procedures    CRITICAL CARE Performed by: Garlon Hatchet   Total critical care time: 35 minutes  Critical care time was exclusive of separately billable procedures and treating other patients.  Critical care was necessary to treat or prevent imminent or life-threatening deterioration.  Critical care was time spent personally by me on the following activities: development of treatment plan with patient and/or surrogate as well as nursing, discussions with consultants, evaluation of patient's response to treatment, examination of patient, obtaining history from patient or surrogate, ordering and performing treatments and interventions, ordering and review of laboratory studies, ordering and review of radiographic studies, pulse oximetry and re-evaluation of patient's condition.   Medications Ordered in ED Medications  cefTRIAXone (ROCEPHIN) 2 g in sodium chloride 0.9 % 100 mL IVPB (0 g Intravenous Stopped 11/07/23 0313)    And  metroNIDAZOLE (FLAGYL) IVPB 500 mg (500 mg Intravenous New  Bag/Given 11/07/23 0314)  acetaminophen (TYLENOL) tablet 1,000 mg (1,000 mg Oral Given 11/07/23 5784)    ED Course/ Medical Decision Making/ A&P                                 Medical Decision Making Amount and/or Complexity of Data Reviewed Labs: ordered. Radiology: ordered and independent interpretation performed. ECG/medicine tests: ordered and independent interpretation performed.  Risk OTC drugs. Prescription drug management. Decision regarding hospitalization.   43 year old female presenting to the ED for right lower quadrant abdominal pain.  Actually began last weekend while she was in [place], seen in the ED there but discharge.  Followed up with PCP  yesterday and had CT scan which showed acute appendicitis.  Currently pain is well-controlled.  She has not had any nausea or vomiting.  Labs here without leukocytosis or electrolyte derangement.  She is given IV abx.  Spoke with general surgery, Dr. Derrell Lolling-- will see in consult in AM.  Final Clinical Impression(s) / ED Diagnoses Final diagnoses:  Other acute appendicitis    Rx / DC Orders ED Discharge Orders     None         Garlon Hatchet, PA-C 11/07/23 0544    Tilden Fossa, MD 11/07/23 [phone]

## 2023-11-07 NOTE — Interval H&P Note (Signed)
 History and Physical Interval Note:  11/07/2023 8:32 AM  Jasmine Bullock  has presented today for surgery, with the diagnosis of Acute Appendicitis.  The various methods of treatment have been discussed with the patient and family. After consideration of risks, benefits and other options for treatment, the patient has consented to  Procedure(s): APPENDECTOMY LAPAROSCOPIC (N/A) as a surgical intervention.  The patient's history has been reviewed, patient examined, no change in status, stable for surgery.  I have reviewed the patient's chart and labs.  Questions were answered to the patient's satisfaction.     Wynona Luna

## 2023-11-07 NOTE — Discharge Instructions (Signed)
 [facility] ______[facility] LAPAROSCOPIC SURGERY: POST OP INSTRUCTIONS Always review your discharge instruction sheet given to you by the facility where your surgery was performed. IF YOU HAVE DISABILITY OR FAMILY LEAVE FORMS, YOU MUST BRING THEM TO THE OFFICE FOR PROCESSING.   DO NOT GIVE THEM TO YOUR DOCTOR.  A prescription for pain medication may be given to you upon discharge.  Take your pain medication as prescribed, if needed.  If narcotic pain medicine is not needed, then you may take acetaminophen (Tylenol) or ibuprofen (Advil) as needed. Take your usually prescribed medications unless otherwise directed. If you need a refill on your pain medication, please contact your pharmacy.  They will contact our office to request authorization. Prescriptions will not be filled after 5pm or on week-ends. You should follow a light diet the first few days after arrival home, such as soup and crackers, etc.  Be sure to include lots of fluids daily. Most patients will experience some swelling and bruising in the area of the incisions.  Ice packs will help.  Swelling and bruising can take several days to resolve.  It is common to experience some constipation if taking pain medication after surgery.  Increasing fluid intake and taking a stool softener (such as Colace) will usually help or prevent this problem from occurring.  A mild laxative (Milk of Magnesia or Miralax) should be taken according to package instructions if there are no bowel movements after 48 hours. Unless discharge instructions indicate otherwise, you may remove your bandages 24-48 hours after surgery, and you may shower at that time.  You may have steri-strips (small skin tapes) in place directly over the incision.  These strips should be left on the skin for 7-10 days.   ACTIVITIES:  You may resume regular (light) daily activities beginning the next day--such as daily self-care, walking, climbing stairs--gradually increasing  activities as tolerated.  You may have sexual intercourse when it is comfortable.  Refrain from any heavy lifting or straining until the soreness has resolved. You may drive when you are no longer taking prescription pain medication, you can comfortably wear a seatbelt, and you can safely maneuver your car and apply brakes. RETURN TO WORK:  __________________________________________________________ Jasmine Bullock should see your doctor in the office for a follow-up appointment approximately 2-3 weeks after your surgery.  Make sure that you call for this appointment within a day or two after you arrive home to insure a convenient appointment time. OTHER INSTRUCTIONS: __________________________________________________________________________________________________________________________ __________________________________________________________________________________________________________________________ WHEN TO CALL YOUR DOCTOR: Fever over 101.0 Inability to urinate Continued bleeding from incision. Increased pain, redness, or drainage from the incision. Increasing abdominal pain  The clinic staff is available to answer your questions during regular business hours.  Please don't hesitate to call and ask to speak to one of the nurses for clinical concerns.  If you have a medical emergency, go to the nearest emergency room or call 911.  A surgeon from [facility] is always on call at the hospital. [address], [place], [place]  [address] ? [address] [address], [place], [place]   [address] [phone] ? [phone] ? FAX [phone] Web site: [email or web address]

## 2023-11-07 NOTE — Op Note (Signed)
 Appendectomy, Lap, Procedure Note  Indications: The patient presented with a history of right-sided abdominal pain. A CT scan revealed findings consistent with acute appendicitis.  Pre-operative Diagnosis: Acute appendicitis without mention of peritonitis  Post-operative Diagnosis: Same  Surgeon: Wynona Luna   Assistants: none  Anesthesia: General endotracheal anesthesia  ASA Class: 1E  Procedure Details  The patient was seen again in the Holding Room. The risks, benefits, complications, treatment options, and expected outcomes were discussed with the patient and/or family. The possibilities of reaction to medication, perforation of viscus, bleeding, recurrent infection, finding a normal appendix, the need for additional procedures, failure to diagnose a condition, and creating a complication requiring transfusion or operation were discussed. There was concurrence with the proposed plan and informed consent was obtained. The site of surgery was properly noted. The patient was taken to Operating Room, identified as AILY TZENG and the procedure verified as Appendectomy. A Time Out was held and the above information confirmed.  The patient was placed in the supine position and general anesthesia was induced.  The abdomen was prepped and draped in a sterile fashion. A one centimeter infraumbilical incision was made.  Dissection was carried down to the fascia bluntly.  The fascia was incised vertically.  We entered the peritoneal cavity bluntly.  A pursestring suture was passed around the fascial opening with a 0 Vicryl.  The Hasson cannula was introduced into the abdomen and the tails of the suture were used to hold the Hasson in place.   The pneumoperitoneum was then established maintaining a maximum pressure of 15 mmHg.  Additional 5 mm cannulas then placed in the left lower quadrant of the abdomen and the epigastrium under direct visualization. A careful evaluation of the entire abdomen  was carried out. The patient was placed in Trendelenburg and left lateral decubitus position.  The scope was moved to the epigastric port site. The cecum was mobilized medially.  The appendix was mildly inflamed, but there was no sign of necrosis, perforation, or abscess. The appendix was carefully dissected. The appendix was skeletonized with the harmonic scalpel.   The appendix was divided at its base using an endo-GIA stapler. No appendiceal stump was left in place. There was no evidence of bleeding, leakage, or complication after division of the appendix. Irrigation was also performed and irrigate suctioned from the abdomen as well.  The patient had reported that she had been worked up for possible gallbladder disease with an ultrasound that was reportedly normal.  I visually examined her gallbladder.  There were some omental adhesions to the gallbladder, but no sign of active inflammation or thickening.  The umbilical port site was closed with the purse string suture. There was no residual palpable fascial defect.  The trocar site skin wounds were closed with 4-0 Monocryl.  Instrument, sponge, and needle counts were correct at the conclusion of the case.   Findings: The appendix was found to be inflamed. There were not signs of necrosis.  There was not perforation. There was not abscess formation.  Estimated Blood Loss:  Minimal         Drains: none         Specimens: Appendix         Complications:  None; patient tolerated the procedure well.         Disposition: PACU - hemodynamically stable.         Condition: stable  Wilmon Arms. Corliss Skains, MD, [facility]  General Surgery  11/07/2023 10:04 AM

## 2023-11-07 NOTE — Anesthesia Postprocedure Evaluation (Signed)
 Anesthesia Post Note  Patient: Jasmine Bullock  Procedure(s) Performed: APPENDECTOMY LAPAROSCOPIC (Abdomen)     Patient location during evaluation: PACU Anesthesia Type: General Level of consciousness: awake and alert Pain management: pain level controlled Vital Signs Assessment: post-procedure vital signs reviewed and stable Respiratory status: spontaneous breathing, nonlabored ventilation and respiratory function stable Cardiovascular status: blood pressure returned to baseline and stable Postop Assessment: no apparent nausea or vomiting Anesthetic complications: no   No notable events documented.  Last Vitals:  Vitals:   11/07/23 1030 11/07/23 1040  BP: 110/64 101/63  Pulse: 69 68  Resp: 17 16  Temp:  36.8 C  SpO2: 99% 97%    Last Pain:  Vitals:   11/07/23 1040  TempSrc:   PainSc: 0-No pain                 Lowella Curb

## 2023-11-07 NOTE — Anesthesia Procedure Notes (Signed)
 Procedure Name: Intubation Date/Time: 11/07/2023 9:19 AM  Performed by: Alwyn Ren, CRNAPre-anesthesia Checklist: Patient identified, Emergency Drugs available, Suction available and Patient being monitored Patient Re-evaluated:Patient Re-evaluated prior to induction Oxygen Delivery Method: Circle system utilized Preoxygenation: Pre-oxygenation with 100% oxygen Induction Type: IV induction Ventilation: Mask ventilation without difficulty Laryngoscope Size: Miller and 2 Grade View: Grade I Tube type: Oral Tube size: 7.0 mm Number of attempts: 1 Airway Equipment and Method: Stylet and Oral airway Placement Confirmation: ETT inserted through vocal cords under direct vision, positive ETCO2 and breath sounds checked- equal and bilateral Secured at: 22 cm Tube secured with: Tape Dental Injury: Teeth and Oropharynx as per pre-operative assessment

## 2023-11-07 NOTE — Anesthesia Preprocedure Evaluation (Signed)
 Anesthesia Evaluation  Patient identified by MRN, date of birth, ID band Patient awake    Reviewed: Allergy & Precautions, NPO status , Patient's Chart, lab work & pertinent test results  Airway Mallampati: II  TM Distance: >3 FB Neck ROM: Full    Dental no notable dental hx.    Pulmonary asthma , former smoker   Pulmonary exam normal breath sounds clear to auscultation       Cardiovascular negative cardio ROS Normal cardiovascular exam Rhythm:Regular Rate:Normal     Neuro/Psych   Anxiety Depression    negative neurological ROS  negative psych ROS   GI/Hepatic Neg liver ROS,GERD  ,,  Endo/Other  negative endocrine ROS    Renal/GU negative Renal ROS  negative genitourinary   Musculoskeletal negative musculoskeletal ROS (+)    Abdominal   Peds negative pediatric ROS (+)  Hematology negative hematology ROS (+)   Anesthesia Other Findings   Reproductive/Obstetrics                             Anesthesia Physical Anesthesia Plan  ASA: 2  Anesthesia Plan: General   Post-op Pain Management: Tylenol PO (pre-op)*   Induction:   PONV Risk Score and Plan: 3 and Ondansetron, Dexamethasone, Midazolam and Treatment may vary due to age or medical condition  Airway Management Planned: Oral ETT  Additional Equipment:   Intra-op Plan:   Post-operative Plan: Extubation in OR  Informed Consent:   Plan Discussed with:   Anesthesia Plan Comments:         Anesthesia Quick Evaluation

## 2023-11-07 NOTE — Telephone Encounter (Signed)
 Spoke with pt and she went in to the ED. Pt was advised of findings.

## 2023-11-07 NOTE — H&P (Signed)
 Jasmine Bullock is an 43 y.o. female.   Chief Complaint: abd pain HPI: Pt  is a 75F with pain that started while visiting [place] on Fri. Pt was eval'd there and was thought to have gb symptoms.  Had [place] which was neg. Pt came back home and f/u'd with PCP and underwent CT and was found to have appendicitis  Pt came to ED.  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   GERD (gastroesophageal reflux disease) 01/28/2020   Plantar fasciitis    left foot   Postpartum hemorrhage 1348cc 04/16/2022   Pregnancy 09/19/2021    Past Surgical History:  Procedure Laterality Date   L wrist surgery     PLANTAR FASCIA RELEASE Left 01/27/2020   WISDOM TOOTH EXTRACTION      Family History  Problem Relation Age of Onset   Hypertension Mother    Ulcers Father    Hearing loss Father    Healthy Sister    Healthy Son    Healthy Son    Ovarian cancer Maternal Grandmother    Cancer Maternal Grandmother    Alcohol abuse Maternal Grandfather    Hypertension Paternal Grandmother    Hyperlipidemia Paternal Grandmother    Diabetes Paternal Grandfather    Heart disease Paternal Grandfather    Hearing loss Paternal Grandfather    Hypertension Paternal Grandfather    Stroke Paternal Grandfather    Colon cancer Neg Hx    Rectal cancer Neg Hx    Esophageal cancer Neg Hx    Breast cancer Neg Hx    Social History:  reports that she quit smoking about 19 years ago. Her smoking use included cigarettes. She started smoking about 24 years ago. She has a  1.3 pack-year smoking history. She has never used smokeless tobacco. She reports current alcohol use. She reports that she does not use drugs.  Allergies:  Allergies  Allergen Reactions   Cat Dander Other (See Comments)   Dust Mite Extract Other (See Comments)    (Not in a hospital admission)   Results for orders placed or performed during the hospital encounter of 11/06/23 (from the past 48 hours)  CBC     Status: None   Collection Time: 11/06/23  8:04 PM  Result Value Ref Range   WBC 9.4 4.0 - 10.5 K/uL   RBC 4.19 3.87 - 5.11 MIL/uL   Hemoglobin 13.9 12.0 - 15.0 g/dL   HCT 21.3 08.6 - 57.8 %   MCV 95.5 80.0 - 100.0 fL   MCH 33.2 26.0 - 34.0 pg   MCHC 34.8 30.0 - 36.0 g/dL   RDW 46.9 62.9 - 52.8 %   Platelets 270 150 - 400 K/uL   nRBC 0.0 0.0 - 0.2 %    Comment: Performed at [facility], [address] [place], [place] [address]  Comprehensive metabolic panel     Status: Abnormal   Collection Time: 11/06/23  8:04 PM  Result Value  Ref Range   Sodium 135 135 - 145 mmol/L   Potassium 4.0 3.5 - 5.1 mmol/L   Chloride 105 98 - 111 mmol/L   CO2 22 22 - 32 mmol/L   Glucose, Bld 112 (H) 70 - 99 mg/dL    Comment: Glucose reference range applies only to samples taken after fasting for at least 8 hours.   BUN 14 6 - 20 mg/dL   Creatinine, Ser 1.61 0.44 - 1.00 mg/dL   Calcium 9.0 8.9 - 09.6 mg/dL   Total Protein 6.6 6.5 - 8.1 g/dL   Albumin 3.6 3.5 - 5.0 g/dL   AST 16 15 - 41 U/L   ALT 13 0 - 44 U/L   Alkaline Phosphatase 37 (L) 38 - 126 U/L   Total Bilirubin 0.5 0.0 - 1.2 mg/dL   GFR, Estimated >04 >54 mL/min    Comment: (NOTE) Calculated using the CKD-EPI Creatinine Equation (2021)    Anion gap 8 5 - 15    Comment: Performed at [facility], [address] [place], [place] [address]  Blood culture (routine x 2)     Status: None (Preliminary result)   Collection Time: 11/06/23  8:22 PM   Specimen: BLOOD  Result Value Ref Range   Specimen Description BLOOD  RIGHT ANTECUBITAL    Special Requests      BOTTLES DRAWN AEROBIC AND ANAEROBIC Blood Culture results may not be optimal due to an inadequate volume of blood received in culture bottles   Culture      NO GROWTH < 12 HOURS Performed at [facility], [address], [place], [place] [address]    Report Status PENDING    CT ABDOMEN PELVIS WO CONTRAST Result Date: 11/06/2023 CLINICAL DATA:  Right lower quadrant abdominal pain over the last 5 days EXAM: CT ABDOMEN AND PELVIS WITHOUT CONTRAST TECHNIQUE: Multidetector CT imaging of the abdomen and pelvis was performed following the standard protocol without IV contrast. RADIATION DOSE REDUCTION: This exam was performed according to the departmental dose-optimization program which includes automated exposure control, adjustment of the mA and/or kV according to patient size and/or use of iterative reconstruction technique. COMPARISON:  10/13/2022 FINDINGS: Lower chest: Unremarkable Hepatobiliary: Stable subtle hypodensity anteriorly in the lateral segment left hepatic lobe potentially from focal steatosis or a small cyst or similar benign lesion. No change over the past year. Pancreas: Unremarkable Spleen: Unremarkable Adrenals/Urinary Tract: Punctate bilateral nonobstructive nephrolithiasis. The largest visible stone is 3 mm in diameter in the left mid kidney. No hydronephrosis, hydroureter, or ureteral stone identified. Stable punctate calcification inferiorly along the urinary bladder wall, no change from last year, probably not loose within the urinary bladder given the non dependent position. Stomach/Bowel: Acute appendicitis is present, with appendiceal caliber thickened to 1 cm and surrounding mild inflammatory stranding. No extraluminal gas, abscess, or evidence of acute rupture at this time. Vascular/Lymphatic: Unremarkable Reproductive: 4 cm left adnexal cyst, image 58 series 2, internal density 19 Hounsfield units. No follow-up imaging recommended.  Note: This recommendation does not apply to premenarchal patients and to those with increased risk (genetic, family history, elevated tumor markers or other high-risk factors) of ovarian cancer. Reference: JACR 2020 Feb; 17(2):248-254 Other: No supplemental non-categorized findings. Musculoskeletal: Unremarkable IMPRESSION: 1. Acute appendicitis, without evidence of acute rupture or abscess. 2. Punctate bilateral nonobstructive nephrolithiasis. 3. Stable punctate calcification inferiorly along the urinary bladder wall, no change from last year, probably not loose within the urinary bladder given the non dependent position.  4. Stable subtle hypodensity anteriorly in the lateral segment left hepatic lobe potentially from focal steatosis or a small cyst or similar benign lesion. No change over the past year. Electronically Signed   By: Gaylyn Rong M.D.   On: 11/06/2023 15:36    Review of Systems  Constitutional:  Negative for chills and fever.  HENT:  Negative for ear discharge, hearing loss and sore throat.   Eyes:  Negative for discharge.  Respiratory:  Negative for cough and shortness of breath.   Cardiovascular:  Negative for chest pain and leg swelling.  Gastrointestinal:  Positive for abdominal pain. Negative for constipation, diarrhea, nausea and vomiting.  Musculoskeletal:  Negative for myalgias and neck pain.  Skin:  Negative for rash.  Allergic/Immunologic: Negative for environmental allergies.  Neurological:  Negative for dizziness and seizures.  Hematological:  Does not bruise/bleed easily.  Psychiatric/Behavioral:  Negative for suicidal ideas.   All other systems reviewed and are negative.   Blood pressure 100/62, pulse 63, temperature 98 F (36.7 C), temperature source Oral, resp. rate 15, height 5\' 7"  (1.702 m), weight 82.6 kg, last menstrual period 10/19/2023, SpO2 97%, not currently breastfeeding. Physical Exam Constitutional:      Appearance: She is well-developed.      Comments: Conversant No acute distress  HENT:     Head: Normocephalic and atraumatic.  Eyes:     General: Lids are normal. No scleral icterus.    Pupils: Pupils are equal, round, and reactive to light.     Comments: Pupils are equal round and reactive No lid lag Moist conjunctiva  Neck:     Thyroid: No thyromegaly.     Trachea: No tracheal tenderness.     Comments: No cervical lymphadenopathy Cardiovascular:     Rate and Rhythm: Normal rate and regular rhythm.     Heart sounds: No murmur heard. Pulmonary:     Effort: Pulmonary effort is normal.     Breath sounds: Normal breath sounds. No wheezing or rales.  Abdominal:     Tenderness: There is abdominal tenderness in the right lower quadrant.     Hernia: No hernia is present.  Musculoskeletal:     Cervical back: Normal range of motion and neck supple.  Skin:    General: Skin is warm.     Findings: No rash.     Nails: There is no clubbing.     Comments: Normal skin turgor  Neurological:     Mental Status: She is alert and oriented to person, place, and time.     Comments: Normal gait and station  Psychiatric:        Mood and Affect: Mood normal.        Thought Content: Thought content normal.        Judgment: Judgment normal.     Comments: Appropriate affect      Assessment/Plan 56F with acute appendicitis To OR this AM with Dr. Corliss Skains for lap appy I discussed with the patient the risks benefits of the procedure to include but not limited to: Infection, bleeding, damage to surrounding structures, possible ileus, possible postoperative infection. Patient voiced understanding and wishes to proceed.   Axel Filler, MD 11/07/2023, 6:54 AM

## 2023-11-07 NOTE — Telephone Encounter (Signed)
 Spoke w pcp, pt having surgery this morning

## 2023-11-08 ENCOUNTER — Encounter (HOSPITAL_COMMUNITY): Payer: Self-pay | Admitting: Surgery

## 2023-11-08 LAB — SURGICAL PATHOLOGY

## 2023-11-11 LAB — CULTURE, BLOOD (ROUTINE X 2): Culture: NO GROWTH

## 2023-11-14 ENCOUNTER — Encounter: Payer: Self-pay | Admitting: Family Medicine

## 2023-11-14 ENCOUNTER — Other Ambulatory Visit: Payer: Self-pay | Admitting: Family Medicine

## 2023-11-14 MED ORDER — FLUCONAZOLE 150 MG PO TABS: 150.0000 mg | ORAL_TABLET | Freq: Once | ORAL | 0 refills | Status: AC

## 2023-11-14 NOTE — Telephone Encounter (Signed)
 Pt requesting diflucan for yeast infection after appendectomy where she had IV abx

## 2023-11-26 ENCOUNTER — Other Ambulatory Visit: Payer: Self-pay

## 2023-11-26 ENCOUNTER — Emergency Department (HOSPITAL_COMMUNITY)
Admission: EM | Admit: 2023-11-26 | Discharge: 2023-11-27 | Discharge status: Against Medical Advice | Attending: Emergency Medicine | Admitting: Emergency Medicine

## 2023-11-26 ENCOUNTER — Encounter (HOSPITAL_COMMUNITY): Payer: Self-pay

## 2023-11-26 ENCOUNTER — Ambulatory Visit: Payer: Self-pay | Admitting: Family Medicine

## 2023-11-26 ENCOUNTER — Encounter: Payer: Self-pay | Admitting: Family Medicine

## 2023-11-26 DIAGNOSIS — Z5321 Procedure and treatment not carried out due to patient leaving prior to being seen by health care provider: Secondary | ICD-10-CM | POA: Diagnosis not present

## 2023-11-26 DIAGNOSIS — R1013 Epigastric pain: Secondary | ICD-10-CM | POA: Insufficient documentation

## 2023-11-26 DIAGNOSIS — R112 Nausea with vomiting, unspecified: Secondary | ICD-10-CM | POA: Insufficient documentation

## 2023-11-26 LAB — COMPREHENSIVE METABOLIC PANEL
ALT: 11 U/L (ref 0–44)
AST: 17 U/L (ref 15–41)
Albumin: 3.8 g/dL (ref 3.5–5.0)
Alkaline Phosphatase: 42 U/L (ref 38–126)
Anion gap: 11 (ref 5–15)
BUN: 15 mg/dL (ref 6–20)
CO2: 23 mmol/L (ref 22–32)
Calcium: 9.1 mg/dL (ref 8.9–10.3)
Chloride: 102 mmol/L (ref 98–111)
Creatinine, Ser: 0.72 mg/dL (ref 0.44–1.00)
GFR, Estimated: >60 mL/min (ref >60)
Glucose, Bld: 85 mg/dL (ref 70–99)
Potassium: 3.9 mmol/L (ref 3.5–5.1)
Sodium: 136 mmol/L (ref 135–145)
Total Bilirubin: 0.6 mg/dL (ref 0.0–1.2)
Total Protein: 6.6 g/dL (ref 6.5–8.1)

## 2023-11-26 LAB — HCG, SERUM, QUALITATIVE: Preg, Serum: NEGATIVE

## 2023-11-26 LAB — CBC
HCT: 40 % (ref 36.0–46.0)
Hemoglobin: 13.7 g/dL (ref 12.0–15.0)
MCH: 32 pg (ref 26.0–34.0)
MCHC: 34.3 g/dL (ref 30.0–36.0)
MCV: 93.5 fL (ref 80.0–100.0)
Platelets: 277 10*3/uL (ref 150–400)
RBC: 4.28 MIL/uL (ref 3.87–5.11)
RDW: 11.6 % (ref 11.5–15.5)
WBC: 9.5 10*3/uL (ref 4.0–10.5)
nRBC: 0 % (ref 0.0–0.2)

## 2023-11-26 LAB — URINALYSIS, ROUTINE W REFLEX MICROSCOPIC
Bilirubin Urine: NEGATIVE
Glucose, UA: NEGATIVE mg/dL
Hgb urine dipstick: NEGATIVE
Ketones, ur: NEGATIVE mg/dL
Leukocytes,Ua: NEGATIVE
Nitrite: NEGATIVE
Protein, ur: NEGATIVE mg/dL
Specific Gravity, Urine: 1.004 — ABNORMAL LOW (ref 1.005–1.030)
pH: 6 (ref 5.0–8.0)

## 2023-11-26 LAB — TROPONIN I (HIGH SENSITIVITY)
Troponin I (High Sensitivity): 3 ng/L (ref <18)
Troponin I (High Sensitivity): 4 ng/L (ref <18)

## 2023-11-26 LAB — LIPASE, BLOOD: Lipase: 32 U/L (ref 11–51)

## 2023-11-26 NOTE — Telephone Encounter (Signed)
 Chief Complaint: epigastric pain Symptoms: epigastric pain, diarrhea Frequency: since last night Pertinent Negatives: Patient denies SOB, weakness, dizziness, sweating, heart palpitations, blurry vision, headache, vomiting Disposition: [x] ED /[] Urgent Care (no appt availability in office) / [] Appointment(In office/virtual)/ []  [facility]/ [] Home Care/ [] Refused Recommended Disposition /[] [facility]/ []  Follow-up with PCP Additional Notes: Pt reports epigastric pain that started last night when she rolled over in bed. Pt states the pain radiates to the L side of her back. Pt reports pain is worse on palpation and rates it a 4/10. Pt has a hx of indigestion and states this does not hurt as much as indigestion, but feels different. Pt had an recent appendectomy. Endorses a little diarrhea today. Denies belching, vomiting, fever. Denies SOB, diaphoresis, palpitations, syncope. States the surgeon that performed her appendectomy told her she was scarring in her gallbladder. Pt does not have a cardiac hx. Per protocol RN advised pt go to the ED to rule out a cardiac cause for her symptoms. RN educated pt on why the ED is the bes and safest place to have her symptoms addressed. Pt verbalized understanding and was agreeable. Pt denies dizziness or lightheadedness. RN advised pt if she becomes SOB or her pain worsens, or if she begins vomiting, feels weak and dizzy, or becomes sweaty, she needs to call 911 and she verbalized understanding.   Copied from CRM [id number]. Topic: Clinical - Red Word Triage >> Nov 26, 2023  2:09 PM Chantha C wrote: Red Word that prompted transfer to Nurse Triage: Patient saw NP, Henson 3 weeks and had appendectomy 2 weeks ago, pain and pressure around the sternum, also left back pain. Patient denies fatigue, weakness, dizziness, shortness of breath, nor headaches. Patient is asking she needs to go to urgent care, wants to speak with a  nurse. Please advise  [phone]. Reason for Disposition  [1] Chest pain (or "angina") comes and goes AND [2] is happening more often (increasing in frequency) or getting worse (increasing in severity)  (Exception: Chest pains that last only a few seconds.)  Answer Assessment - Initial Assessment Questions 1. LOCATION: "Where does it hurt?"       Upper abd pain, by sternum 2. RADIATION: "Does the pain go anywhere else?" (e.g., into neck, jaw, arms, back)     L back - "it came on with the pain in my sternum", that pain was worse this AM (felt like she pulled a muscle) 3. ONSET: "When did the chest pain begin?" (Minutes, hours or days)      "In the middle of the night last night I rolled over and I immediately felt it in my sternum, I did not feel it in my back until this AM" 4. PATTERN: "Does the pain come and go, or has it been constant since it started?"  "Does it get worse with exertion?"      "Right now I am hunched over and feel good, when I get up I will feel it" 6. SEVERITY: "How bad is the pain?"  (e.g., Scale 1-10; mild, moderate, or severe)    - MILD (1-3): doesn't interfere with normal activities     - MODERATE (4-7): interferes with normal activities or awakens from sleep    - SEVERE (8-10): excruciating pain, unable to do any normal activities       "Definitely tolerable, it's just not normal", maybe a 4/10 when she presses on it 7. CARDIAC RISK FACTORS: "Do you have any history of heart problems  or risk factors for heart disease?" (e.g., angina, prior heart attack; diabetes, high blood pressure, high cholesterol, smoker, or strong family history of heart disease)     High cholesterol 8. PULMONARY RISK FACTORS: "Do you have any history of lung disease?"  (e.g., blood clots in lung, asthma, emphysema, birth control pills)     No 9. CAUSE: "What do you think is causing the chest pain?"     Not sure - maybe indigestion 10. OTHER SYMPTOMS: "Do you have any other symptoms?" (e.g., dizziness, nausea,  vomiting, sweating, fever, difficulty breathing, cough) "Pressure/pain" same area as indigestion pain. Pain in the upper abdominal area right below her sternum. Hurts worse with palpation, "problems with indigestion for a long time, went to the ED, they did not see anything with my gallbladder on an [place]", "Henson recommended CT, we found appendicitis." Appendectomy 2 wks ago. Still having problems with indigestion. Denies nausea and vomiting, but endorses "a little" diarrhea earlier. No belching or gas. No difficulty breathing but states deep inspiration makes the pain worse. No dizziness, no weakness, not feeling like she is going to pass out. No sweating today, had cold sweats last week. Denies fever. H/A couple days ago, no blurry vision. No heart palpitations. Pt reports this pain feels less bad than her regular indigestion, states this pain is "different." Surgeon said he saw gallbladder scarring.  Protocols used: Chest Pain-A-AH

## 2023-11-26 NOTE — ED Notes (Signed)
 Pt states she is leaving d/t wait time after being encouraged to stay.

## 2023-11-26 NOTE — ED Triage Notes (Signed)
 Pt presents with epigastric pain and tenderness described as a pressure and radiates to her back. Pt reports issues started 3.5 weeks ago when she had severe epigastric pain with N/V while vacationing in [place]. When she got home and saw her PCP she was hurting in a different location and was found to have appendicitis. She had an appendectomy 2.5 weeks ago. Last week she had some mild indigestion, but then this AM is when the epigastric pain became severe and radiated into her back. Pt was referred to ED by her PCP for a cardiac workup.

## 2023-11-26 NOTE — ED Provider Triage Note (Signed)
 Emergency Medicine Provider Triage Evaluation Note  Jasmine Bullock , a 43 y.o. female  was evaluated in triage.  Pt complains of epigastric abdominal pain with radiation to chest, back.  Patient reports that she had some severe epigastric abdominal pain, nausea, vomiting, was seen in [place], right upper quadrant ultrasound with no focal etiology, went to primary care doctor and ended up having an appendicitis diagnosed, appendectomy 2 weeks ago.  Began having return of epigastric abdominal pain not worse with exertion, not worse with eating.  Told to come get evaluated for possible ACS by PCP.  Denies lower abdominal pain, denies any complication surgical site..  Review of Systems  Positive: Abdominal pain, nausea, vomiting Negative: Fever, chills, diarrhea  Physical Exam  BP 110/62 (BP Location: Right Arm)   Pulse 71   Temp 98.4 F (36.9 C)   Resp 16   Ht 5' 6.75" (1.695 m)   Wt 79.4 kg   LMP 11/15/2023   SpO2 98%   BMI 27.61 kg/m  Gen:   Awake, no distress   Resp:  Normal effort  MSK:   Moves extremities without difficulty  Other:  Most focally tender in the epigastric region, no rebound, rigidity, guarding throughout, no Murphy sign on my exam.  Medical Decision Making  Medically screening exam initiated at 3:33 PM.  Appropriate orders placed.  JEANNIA TATRO was informed that the remainder of the evaluation will be completed by another provider, this initial triage assessment does not replace that evaluation, and the importance of remaining in the ED until their evaluation is complete.  Workup initiated in triage    Olene Floss, [place] 11/26/23 1533

## 2023-11-26 NOTE — Telephone Encounter (Signed)
 Please advise as PCP is out of office. Pt recently had appendectomy

## 2023-11-27 ENCOUNTER — Encounter: Payer: Self-pay | Admitting: Internal Medicine

## 2023-11-27 ENCOUNTER — Ambulatory Visit: Payer: Self-pay | Admitting: Family Medicine

## 2023-11-27 ENCOUNTER — Ambulatory Visit (INDEPENDENT_AMBULATORY_CARE_PROVIDER_SITE_OTHER): Admitting: Internal Medicine

## 2023-11-27 VITALS — BP 122/70 | HR 67 | Temp 98.3°F | Ht 66.75 in | Wt 175.0 lb

## 2023-11-27 DIAGNOSIS — R9431 Abnormal electrocardiogram [ECG] [EKG]: Secondary | ICD-10-CM | POA: Diagnosis not present

## 2023-11-27 DIAGNOSIS — R350 Frequency of micturition: Secondary | ICD-10-CM | POA: Diagnosis not present

## 2023-11-27 DIAGNOSIS — R1013 Epigastric pain: Secondary | ICD-10-CM | POA: Diagnosis not present

## 2023-11-27 DIAGNOSIS — E559 Vitamin D deficiency, unspecified: Secondary | ICD-10-CM

## 2023-11-27 DIAGNOSIS — E7800 Pure hypercholesterolemia, unspecified: Secondary | ICD-10-CM

## 2023-11-27 LAB — URINALYSIS, ROUTINE W REFLEX MICROSCOPIC
Bilirubin Urine: NEGATIVE
Hgb urine dipstick: NEGATIVE
Ketones, ur: NEGATIVE
Leukocytes,Ua: NEGATIVE
Nitrite: NEGATIVE
RBC / HPF: NONE SEEN (ref 0–?)
Specific Gravity, Urine: 1.01 (ref 1.000–1.030)
Total Protein, Urine: NEGATIVE
Urine Glucose: NEGATIVE
Urobilinogen, UA: 0.2 (ref 0.0–1.0)
pH: 7.5 (ref 5.0–8.0)

## 2023-11-27 MED ORDER — SUCRALFATE 1 G PO TABS: 1.0000 g | ORAL_TABLET | Freq: Four times a day (QID) | ORAL | 0 refills | Status: DC

## 2023-11-27 MED ORDER — PANTOPRAZOLE SODIUM 40 MG PO TBEC: 40.0000 mg | DELAYED_RELEASE_TABLET | Freq: Every day | ORAL | 3 refills | Status: AC

## 2023-11-27 NOTE — Assessment & Plan Note (Signed)
 Incidental today - ok for ua and cx r/o uti

## 2023-11-27 NOTE — Telephone Encounter (Signed)
 Copied from CRM [id number]. Topic: Clinical - Red Word Triage >> Nov 27, 2023  8:09 AM Elizebeth Brooking wrote: Red Word that prompted transfer to Nurse Triage: Patient called in yesterday, was suggested to got to ER to make sure he wasn't having a heart attack, got EKG ran , showed abnormalities, would like for a nurse to read that and explain that to her  Patient called for apt and wants to see provider today; was in ER yesterday to be r/o MI but patient left ER.  Noticed today that she had an abnormal EKG.  Apt made for today.  Would like call back from the office.

## 2023-11-27 NOTE — Progress Notes (Signed)
 Patient ID: Jasmine Bullock, female   DOB: 11/30/1980, 43 y.o.   MRN: [id number]        Chief Complaint: follow up several concerns       HPI:  Jasmine Bullock is a 43 y.o. female here with longer hx - 3.5 wks ago with indigetsion in [place], seen in ED, GB scan neg, pain improved, later f/u here with NP found RLQ pain, had CT and then appendectomy;  did well post op but again later had epigastric pain pressure like - pt directed to ED, seen mar 17, initial EKG ok per pt, waited over 7 hrs in waiting room and had to leave to go home without further eval.  Has had mild worsening reflux as well as tender to zyphoid epigastric area, but no dysphagia, n/v, bowel change or blood.  Pt denies chest pain, increased sob or doe, wheezing, orthopnea, PND, increased LE swelling, palpitations, dizziness or syncope.   Pt denies polydipsia, polyuria, or new focal neuro s/s.  Denies urinary symptoms such as dysuria, urgency, flank pain, hematuria or n/v, fever, chills, though does have urinary frequency increased last 3 days.   Wt Readings from Last 3 Encounters:  11/27/23 175 lb (79.4 kg)  11/26/23 175 lb (79.4 kg)  11/07/23 (P) 182 lb 1.6 oz (82.6 kg)   BP Readings from Last 3 Encounters:  11/27/23 122/70  11/26/23 103/64  11/07/23 101/63         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 Her smoking use included cigarettes. She started smoking about 24 years ago. She has a 1.3 pack-year smoking history. She has never used smokeless tobacco. She reports current alcohol use. She reports that she does not use drugs. family history includes Alcohol abuse in her maternal grandfather; Cancer in her maternal grandmother; Diabetes in her paternal grandfather; Healthy in her sister, son, and son; Hearing loss in her father and paternal grandfather; Heart disease in her paternal grandfather; Hyperlipidemia in her paternal grandmother; Hypertension in her mother, paternal grandfather, and paternal grandmother; Ovarian cancer in her maternal grandmother; Stroke in her paternal grandfather; Ulcers in her father. Allergies  Allergen Reactions   Cat Dander Other (See Comments)   Dust Mite Extract Other (See Comments)   Current Outpatient Medications on File Prior to Visit  Medication Sig Dispense Refill   acetaminophen (TYLENOL) 500 MG tablet Take 1,000 mg by mouth every 6 (six) hours as needed for  mild pain (pain score 1-3).     Calcium Carb-Cholecalciferol (CALCIUM + VITAMIN D3) 600-10 MG-MCG TABS Take 1 tablet by mouth daily.     citalopram (CELEXA) 20 MG tablet Take 1 tablet (20 mg total) by mouth daily. 30 tablet 3   famotidine (PEPCID) 10 MG tablet Take 10 mg by mouth as needed for heartburn or indigestion.     fluconazole (DIFLUCAN) 150 MG tablet Take 150 mg by mouth once.     Glucosamine HCl (GLUCOSAMINE PO) Take by mouth.     oxyCODONE (OXY IR/ROXICODONE) 5 MG immediate release tablet Take 1 tablet (5 mg total) by mouth every 6 (six) hours as needed for severe pain  (pain score 7-10). 15 tablet 0   No current facility-administered medications on file prior to visit.        ROS:  All others reviewed and negative.  Objective        PE:  BP 122/70 (BP Location: Left Arm, Patient Position: Sitting, Cuff Size: Normal)   Pulse 67   Temp 98.3 F (36.8 C) (Oral)   Ht 5' 6.75" (1.695 m)   Wt 175 lb (79.4 kg)   LMP 11/15/2023 (Exact Date)   SpO2 99%   Breastfeeding No   BMI 27.61 kg/m                 Constitutional: Pt appears in NAD               HENT: Head: NCAT.                Right Ear: External ear normal.                 Left Ear: External ear normal.                Eyes: . Pupils are equal, round, and reactive to light. Conjunctivae and EOM are normal               Nose: without d/c or deformity               Neck: Neck supple. Gross normal ROM               Cardiovascular: Normal rate and regular rhythm.                 Pulmonary/Chest: Effort normal and breath sounds without rales or wheezing.                Abd:  Soft, mild epigastric and xyphoid tender, ND, + BS, no organomegaly               Neurological: Pt is alert. At baseline orientation, motor grossly intact               Skin: Skin is warm. No rashes, no other new lesions, LE edema - none               Psychiatric: Pt behavior is normal without agitation, mild to mod nervous  Micro: none  Cardiac tracings I have personally interpreted today:  none  Pertinent Radiological findings (summarize): none   Lab Results  Component Value Date   WBC 9.5 11/26/2023   HGB 13.7 11/26/2023   HCT 40.0 11/26/2023   PLT 277 11/26/2023   GLUCOSE 85 11/26/2023   CHOL 177 08/22/2023   TRIG 56.0 08/22/2023   HDL 63.20 08/22/2023   LDLCALC 103 (H) 08/22/2023   ALT 11 11/26/2023   AST 17 11/26/2023  NA 136 11/26/2023   K 3.9 11/26/2023   CL 102 11/26/2023   CREATININE 0.72 11/26/2023   BUN 15 11/26/2023   CO2 23 11/26/2023   TSH 1.44 08/22/2023   HGBA1C 4.5 (L) 06/27/2019    Assessment/Plan:  Jasmine Bullock is a 43 y.o. White or Caucasian [1] female with  has a past medical history of Abnormal finding on antenatal screening of mother (08/22/2023), Abnormal mammogram (01/11/2023), Acute thrombosis of superficial veins of upper extremity (04/19/2022), Allergy, Anxiety, Asthma, Bacterial vaginosis in pregnancy (12/15/2021), Candidiasis of vagina (12/15/2021), Depression, Depressive disorder (12/30/2020), Female infertility, secondary (06/19/2019), GERD (gastroesophageal reflux disease) (01/28/2020), Plantar fasciitis, Postpartum hemorrhage 1348cc (04/16/2022), and Pregnancy (09/19/2021).  Abnormal ECG ECG yesterday with suggestion of septal infarct, pt very concerned, I supsect this seems less likely, but will check echo per pt request; also for Card Ct score given age, hld, and abnormal ecg  Epigastric pain Has some xyphoid tender but also can't r/o gastritis in addition to reflux; now for protonix 40 every day, carafate 1 qid x 1 mo, and refer GI though this may be cancelled if symptoms improve  Pure hypercholesterolemia Lab Results  Component Value Date   LDLCALC 103 (H) 08/22/2023   Mild uncontrolled, for lower chol diet, declines statin for now  Urinary frequency Incidental today - ok for ua and cx r/o uti  Vitamin D deficiency Last vitamin D Lab Results  Component Value Date   VD25OH 29.46 (L) 08/22/2023   Low, to start oral replacement  Followup: Return if symptoms worsen or fail to improve.  Oliver Barre, MD 11/27/2023 1:16 PM [facility] Medicine

## 2023-11-27 NOTE — Assessment & Plan Note (Signed)
 Has some xyphoid tender but also can't r/o gastritis in addition to reflux; now for protonix 40 every day, carafate 1 qid x 1 mo, and refer GI though this may be cancelled if symptoms improve

## 2023-11-27 NOTE — Patient Instructions (Signed)
 Please take all new medication as prescribed  - the protonix for at least 1 mo (or longer if needed)  Please take all new medication as prescribed - the carafate for 1 month only  Please continue all other medications as before, and refills have been done if requested.  Please have the pharmacy call with any other refills you may need.  Please continue your efforts at being more active, low cholesterol diet, and weight control.  You are otherwise up to date with prevention measures today.  Please keep your appointments with your specialists as you may have planned  You will be contacted regarding the referral for: Gastroenterology, Echocardiogram, and Cardiac CT score  Please go to the LAB at the blood drawing area for the tests to be done  - just the urine testing today  You will be contacted by phone if any changes need to be made immediately.  Otherwise, you will receive a letter about your results with an explanation, but please check with MyChart first.

## 2023-11-27 NOTE — Assessment & Plan Note (Signed)
 Lab Results  Component Value Date   LDLCALC 103 (H) 08/22/2023   Mild uncontrolled, for lower chol diet, declines statin for now

## 2023-11-27 NOTE — Assessment & Plan Note (Signed)
 Last vitamin D Lab Results  Component Value Date   VD25OH 29.46 (L) 08/22/2023   Low, to start oral replacement

## 2023-11-27 NOTE — Assessment & Plan Note (Signed)
 ECG yesterday with suggestion of septal infarct, pt very concerned, I supsect this seems less likely, but will check echo per pt request; also for Card Ct score given age, hld, and abnormal ecg

## 2023-11-27 NOTE — Telephone Encounter (Signed)
 Pt has appt today at 11 am and will need to discuss at visit. Unable to read EKG from hospital as it was not done in office.

## 2023-11-28 ENCOUNTER — Ambulatory Visit (HOSPITAL_COMMUNITY): Attending: Internal Medicine

## 2023-11-28 ENCOUNTER — Encounter: Payer: Self-pay | Admitting: Internal Medicine

## 2023-11-28 DIAGNOSIS — R9431 Abnormal electrocardiogram [ECG] [EKG]: Secondary | ICD-10-CM | POA: Diagnosis present

## 2023-11-28 LAB — ECHOCARDIOGRAM COMPLETE
Area-P 1/2: 3.65 cm2
S' Lateral: 3.2 cm

## 2023-11-28 LAB — URINE CULTURE: Result:: NO GROWTH

## 2023-11-28 NOTE — Telephone Encounter (Signed)
 Pt was seen by Dr. Jonny Ruiz yesterday

## 2023-11-30 ENCOUNTER — Other Ambulatory Visit: Payer: Self-pay | Admitting: Surgery

## 2023-11-30 DIAGNOSIS — R1011 Right upper quadrant pain: Secondary | ICD-10-CM

## 2023-12-05 ENCOUNTER — Encounter: Payer: Self-pay | Admitting: Family Medicine

## 2023-12-05 ENCOUNTER — Ambulatory Visit (INDEPENDENT_AMBULATORY_CARE_PROVIDER_SITE_OTHER): Admitting: Family Medicine

## 2023-12-05 VITALS — BP 100/62 | Temp 98.6°F | Ht 66.75 in | Wt 175.8 lb

## 2023-12-05 DIAGNOSIS — B9689 Other specified bacterial agents as the cause of diseases classified elsewhere: Secondary | ICD-10-CM

## 2023-12-05 DIAGNOSIS — J069 Acute upper respiratory infection, unspecified: Secondary | ICD-10-CM

## 2023-12-05 DIAGNOSIS — J029 Acute pharyngitis, unspecified: Secondary | ICD-10-CM | POA: Diagnosis not present

## 2023-12-05 DIAGNOSIS — R0981 Nasal congestion: Secondary | ICD-10-CM

## 2023-12-05 DIAGNOSIS — R051 Acute cough: Secondary | ICD-10-CM | POA: Diagnosis not present

## 2023-12-05 LAB — POCT RAPID STREP A (OFFICE): Rapid Strep A Screen: NEGATIVE

## 2023-12-05 LAB — POC COVID19 BINAXNOW: SARS Coronavirus 2 Ag: NEGATIVE

## 2023-12-05 LAB — POCT INFLUENZA A/B
Influenza A, POC: NEGATIVE
Influenza B, POC: NEGATIVE

## 2023-12-05 MED ORDER — PREDNISONE 20 MG PO TABS
40.0000 mg | ORAL_TABLET | Freq: Every day | ORAL | 0 refills | Status: AC
Start: 2023-12-05 — End: 2023-12-10

## 2023-12-05 MED ORDER — FLUCONAZOLE 150 MG PO TABS: 150.0000 mg | ORAL_TABLET | Freq: Once | ORAL | 0 refills | Status: AC

## 2023-12-05 MED ORDER — AZITHROMYCIN 250 MG PO TABS: ORAL_TABLET | ORAL | 0 refills | Status: AC

## 2023-12-05 NOTE — Progress Notes (Signed)
 Acute Office Visit  Subjective:     Patient ID: Jasmine Bullock, female    DOB: Jan 21, 1981, 43 y.o.   MRN: [id number]  No chief complaint on file.   HPI Patient is in today for evaluation of sore throat, fatigue, cough, for the last week. Has tried ibuprofen and tylenol with mild temporary relief. Reports that one of her children has strep and the other has an ear infection. Denies abdominal pain, nausea, vomiting, diarrhea, rash, fever, chills, other symptoms.  Medical hx as outlined below.  ROS Per HPI      Objective:    BP 100/62 (BP Location: Left Arm, Patient Position: Sitting)   Temp 98.6 F (37 C) (Temporal)   Ht 5' 6.75" (1.695 m)   Wt 175 lb 12.8 oz (79.7 kg)   LMP 11/15/2023 (Exact Date)   SpO2 98%   BMI 27.74 kg/m    Physical Exam Vitals and nursing note reviewed.  Constitutional:      General: She is not in acute distress.    Comments: Appears fatigued  HENT:     Head: Normocephalic and atraumatic.     Nose: Congestion present.     Mouth/Throat:     Mouth: Mucous membranes are moist.     Pharynx: Oropharynx is clear. Posterior oropharyngeal erythema present. No oropharyngeal exudate.     Comments: Oropharyngeal cobblestoning   Eyes:     Extraocular Movements: Extraocular movements intact.  Cardiovascular:     Rate and Rhythm: Normal rate and regular rhythm.     Heart sounds: Normal heart sounds.  Pulmonary:     Effort: Pulmonary effort is normal. No respiratory distress.     Breath sounds: No wheezing, rhonchi or rales.  Musculoskeletal:     Cervical back: Normal range of motion and neck supple.  Lymphadenopathy:     Cervical: No cervical adenopathy.  Skin:    General: Skin is warm and dry.  Neurological:     Mental Status: She is alert.   Results for orders placed or performed in visit on 12/05/23  POCT Influenza A/B  Result Value Ref Range   Influenza A, POC Negative Negative   Influenza B, POC Negative Negative  POC COVID-19  BinaxNow  Result Value Ref Range   SARS Coronavirus 2 Ag Negative Negative  POCT rapid strep A  Result Value Ref Range   Rapid Strep A Screen Negative Negative        Assessment & Plan:   Bacterial URI -     Azithromycin; Take 2 tablets on day 1, then 1 tablet daily on days 2 through 5  Dispense: 6 tablet; Refill: 0 -     predniSONE; Take 2 tablets (40 mg total) by mouth daily for 5 days.  Dispense: 10 tablet; Refill: 0 -     Fluconazole; Take 1 tablet (150 mg total) by mouth once for 1 dose. If symptoms are still present after 3 days, may take 2nd dose  Dispense: 2 tablet; Refill: 0  Acute cough -     POCT Influenza A/B -     POC COVID-19 BinaxNow -     POCT rapid strep A  Sore throat -     POCT rapid strep A  Nasal congestion -     POCT Influenza A/B -     POC COVID-19 BinaxNow -     POCT rapid strep A     Meds ordered this encounter  Medications   azithromycin (ZITHROMAX) 250 MG tablet  Sig: Take 2 tablets on day 1, then 1 tablet daily on days 2 through 5    Dispense:  6 tablet    Refill:  0   predniSONE (DELTASONE) 20 MG tablet    Sig: Take 2 tablets (40 mg total) by mouth daily for 5 days.    Dispense:  10 tablet    Refill:  0   fluconazole (DIFLUCAN) 150 MG tablet    Sig: Take 1 tablet (150 mg total) by mouth once for 1 dose. If symptoms are still present after 3 days, may take 2nd dose    Dispense:  2 tablet    Refill:  0    Return if symptoms worsen or fail to improve.  Moshe Cipro, FNP

## 2023-12-10 ENCOUNTER — Encounter (HOSPITAL_COMMUNITY)
Admission: RE | Admit: 2023-12-10 | Discharge: 2023-12-10 | Disposition: A | Discharge status: Home / Self Care | Source: Ambulatory Visit | Attending: Surgery | Admitting: Surgery

## 2023-12-10 ENCOUNTER — Encounter: Payer: Self-pay | Admitting: Internal Medicine

## 2023-12-10 ENCOUNTER — Ambulatory Visit (HOSPITAL_COMMUNITY)
Admission: RE | Admit: 2023-12-10 | Discharge: 2023-12-10 | Disposition: A | Discharge status: Home / Self Care | Payer: Self-pay | Source: Ambulatory Visit | Attending: Internal Medicine | Admitting: Internal Medicine

## 2023-12-10 DIAGNOSIS — R9431 Abnormal electrocardiogram [ECG] [EKG]: Secondary | ICD-10-CM | POA: Insufficient documentation

## 2023-12-10 DIAGNOSIS — R1011 Right upper quadrant pain: Secondary | ICD-10-CM | POA: Diagnosis present

## 2023-12-10 DIAGNOSIS — E7800 Pure hypercholesterolemia, unspecified: Secondary | ICD-10-CM | POA: Insufficient documentation

## 2023-12-10 MED ORDER — TECHNETIUM TC 99M MEBROFENIN IV KIT
5.4000 | PACK | Freq: Once | INTRAVENOUS | Status: DC
Start: 2023-12-10 — End: 2023-12-16

## 2023-12-11 NOTE — Progress Notes (Signed)
 Please call the patient and let them know that their HIDA scan showed normal gallbladder function.  After her GI evaluation, if she is still having symptoms and her GI work-up is negative, I can see her to discuss elective cholecystectomy

## 2023-12-13 ENCOUNTER — Encounter: Payer: Self-pay | Admitting: Physician Assistant

## 2023-12-21 ENCOUNTER — Encounter: Payer: Self-pay | Admitting: Family Medicine

## 2023-12-23 ENCOUNTER — Other Ambulatory Visit: Payer: Self-pay | Admitting: Family Medicine

## 2023-12-23 DIAGNOSIS — R1013 Epigastric pain: Secondary | ICD-10-CM

## 2023-12-31 ENCOUNTER — Other Ambulatory Visit

## 2023-12-31 DIAGNOSIS — R1013 Epigastric pain: Secondary | ICD-10-CM

## 2024-01-02 ENCOUNTER — Encounter: Payer: Self-pay | Admitting: Family Medicine

## 2024-01-02 LAB — H. PYLORI ANTIGEN, STOOL: H pylori Ag, Stl: NEGATIVE

## 2024-02-05 ENCOUNTER — Other Ambulatory Visit: Payer: Self-pay | Admitting: Family Medicine

## 2024-02-05 DIAGNOSIS — Z8659 Personal history of other mental and behavioral disorders: Secondary | ICD-10-CM

## 2024-02-05 DIAGNOSIS — F411 Generalized anxiety disorder: Secondary | ICD-10-CM

## 2024-02-06 ENCOUNTER — Telehealth: Payer: Self-pay | Admitting: Family Medicine

## 2024-02-06 MED ORDER — CITALOPRAM HYDROBROMIDE 20 MG PO TABS: ORAL_TABLET | ORAL | 0 refills | Status: DC

## 2024-02-06 NOTE — Addendum Note (Signed)
 Addended by: Nekeisha Aure E on: 02/06/2024 11:22 AM   Modules accepted: Orders

## 2024-02-06 NOTE — Telephone Encounter (Signed)
 Copied from CRM [id number]. Topic: Clinical - Medication Question >> Feb 06, 2024 10:29 AM Adonis Hoot wrote: Reason for CRM: Patient would like to know if prescription citalopram  (CELEXA ) 20 MG tablet could be resent to   [facility] [id number] [place], [place] - [address]  Phone: [phone] Fax: [phone]  She stated that this will be her new pharmacy.

## 2024-02-06 NOTE — Telephone Encounter (Signed)
 Rx resent.

## 2024-02-11 NOTE — Progress Notes (Addendum)
 02/12/2024 IDORA BROSIOUS [id number] [birth year]  Referring provider: Abram Abraham, NP-C Primary GI doctor: Dr. Yvone Herd ( Dr. Savannah Curlin)   ASSESSMENT AND PLAN:  Epigastric pain/GERD x 2021 improved with pantoprazole  40 mg however returned, with nausea occ bilious vomiting, bloating S/p appendectomy but symptoms continued after removal Worse post prandial, has early satiety, has had weight loss 11/26/2023 normal liver function, normal CBC no anemia, normal lipase CT abdomen pelvis 10/2023 without contrast shows acute appendicitis bilateral nonobstructing nephrolithiasis focal fat sparing along liver possible cyst has been stable, s/p appendectomy  12/10/2023 HIDA scan unremarkable 12/10/2023 cardiac calcium score 0 12/31/2023 H. pylori stool antigen negative but she was on PPI -continue pantoprazole  40 mg daily, 30 mins before food - sent in levsin as needed for spasms - check CBC, CMET, celiac  -Lifestyle changes discussed, avoid NSAIDS, ETOH, hand out given to the patient -Schedule EGD at [facility] to evaluate GERD, esophagitis, hiatal hernia,I discussed risks of EGD with patient today, including risk of sedation, bleeding or perforation. Patient provides understanding and gave verbal consent to proceed. -Consider GES if EGD is negative with description, gastroparesis diet given -if this is all negative consider referral back to [facility] to evaluate for cholecystectomy   Screening colonoscopy No family history of colon cancer Screening at age 87 unless she develops symtptoms  I have reviewed the clinic note as outlined by Santina Cull, PA and agree with the assessment, plan and medical decision making.  Ms. Corl presents to the office for evaluation and management of epigastric abdominal pain, GERD, early satiety and weight loss.  Underwent appendectomy in February 2025 with persistent symptoms.  Negative H. pylori stool antigen but was on PPI.  HIDA negative.  Agree with PPI and EGD at  [facility].  Eugenia Hess, MD  Patient Care Team: Abram Abraham, NP-C as PCP - General (Family Medicine) Lindle Rhea, MD (Inactive) as Consulting Physician (Gastroenterology) Amada Backer, MD as Consulting Physician (Orthopedic Surgery) Audelia Leaks, MD as Consulting Physician (Obstetrics and Gynecology)  HISTORY OF PRESENT ILLNESS: 43 y.o. female with a past medical history listed below presents for evaluation of epigastric pain.   Discussed the use of AI scribe software for clinical note transcription with the patient, who gave verbal consent to proceed.  History of Present Illness   SUZY KUGEL is a 43 year old female who presents with recurrent abdominal pain and gastrointestinal symptoms.  She has experienced gastrointestinal symptoms since 2021, initially while on meloxicam  for foot pain. An H. pylori breath test in 2021 and a stool test in April 2025 were both negative. She has been taking Protonix , which has helped manage her symptoms.  In February 2025, she had severe abdominal pain after eating [place] food in [place] , described as 'worse than childbirth pain', with continuous vomiting. She visited the ER, where cardiac issues were ruled out, and she received IV Zofran . An ultrasound of her gallbladder showed no abnormalities.  After returning to [place] , she experienced bloating and abdominal tenderness. A CT scan led to an appendectomy. Post-surgery, she continued to have similar symptoms, prompting further evaluation, including a normal echocardiogram.  Her symptoms are exacerbated by certain foods, particularly large salads without carbohydrates and carbonated drinks. She avoids these triggers and has adjusted her diet. She experiences early satiety and has lost weight post-appendectomy due to eating smaller meals.  Her current medications include Protonix  40 mg once daily and Carafate  as needed. She occasionally uses Tums for symptom relief.  No trouble  swallowing, shortness of breath, or chest discomfort, but she reports a burning sensation in the epigastric region without acid reflux symptoms. Her symptoms are not related to any recent infections or COVID-19.  She has a history of occasional nausea, particularly after her appendectomy, managed with Zofran . No swelling in her legs and no significant family history of gastrointestinal issues. Her social history includes occasional alcohol consumption and rare use of ibuprofen  for cramps.        She  reports that she quit smoking about 20 years ago. Her smoking use included cigarettes. She started smoking about 25 years ago. She has a 1.3 pack-year smoking history. She has never used smokeless tobacco. She reports current alcohol use. She reports that she does not use drugs.  RELEVANT GI HISTORY, IMAGING AND LABS: Results   LABS H pylori breath test: Negative (2021) Stool test: Negative (12/31/2023)  RADIOLOGY Gallbladder ultrasound: No abnormalities (10/2023) Abdominal CT scan: Appendicitis (10/2023) HIDA scan: No blockages (12/10/2023)  DIAGNOSTIC Echocardiogram: Normal (11/2023)     11/06/2023 CT Ab without contrast 1. Acute appendicitis, without evidence of acute rupture or abscess. 2. Punctate bilateral nonobstructive nephrolithiasis. 3. Stable punctate calcification inferiorly along the urinary bladder wall, no change from last year, probably not loose within the urinary bladder given the non dependent position. 4. Stable subtle hypodensity anteriorly in the lateral segment left hepatic lobe potentially from focal steatosis or a small cyst or similar benign lesion. No change over the past year. CBC    Component Value Date/Time   WBC 9.5 11/26/2023 1523   RBC 4.28 11/26/2023 1523   HGB 13.7 11/26/2023 1523   HGB 13.9 06/27/2019 0930   HCT 40.0 11/26/2023 1523   HCT 40.0 06/27/2019 0930   PLT 277 11/26/2023 1523   PLT 247 06/27/2019 0930   MCV 93.5 11/26/2023 1523    MCV 93 06/27/2019 0930   MCH 32.0 11/26/2023 1523   MCHC 34.3 11/26/2023 1523   RDW 11.6 11/26/2023 1523   RDW 11.4 (L) 06/27/2019 0930   LYMPHSABS 1.7 11/06/2023 0939   LYMPHSABS 2.0 06/27/2019 0930   MONOABS 0.5 11/06/2023 0939   EOSABS 0.1 11/06/2023 0939   EOSABS 0.2 06/27/2019 0930   BASOSABS 0.0 11/06/2023 0939   BASOSABS 0.0 06/27/2019 0930   Recent Labs    08/22/23 1114 11/06/23 0939 11/06/23 2004 11/26/23 1523  HGB 14.7 14.2 13.9 13.7    CMP     Component Value Date/Time   NA 136 11/26/2023 1523   NA 138 06/27/2019 0930   K 3.9 11/26/2023 1523   CL 102 11/26/2023 1523   CO2 23 11/26/2023 1523   GLUCOSE 85 11/26/2023 1523   BUN 15 11/26/2023 1523   BUN 11 06/27/2019 0930   CREATININE 0.72 11/26/2023 1523   CREATININE 0.66 07/09/2020 1340   CALCIUM 9.1 11/26/2023 1523   PROT 6.6 11/26/2023 1523   PROT 6.9 06/27/2019 0930   ALBUMIN 3.8 11/26/2023 1523   ALBUMIN 4.2 06/27/2019 0930   AST 17 11/26/2023 1523   ALT 11 11/26/2023 1523   ALKPHOS 42 11/26/2023 1523   BILITOT 0.6 11/26/2023 1523   BILITOT 0.6 06/27/2019 0930   GFRNONAA >60 11/26/2023 1523   GFRNONAA 111 07/09/2020 1340   GFRAA 129 07/09/2020 1340      Latest Ref Rng & Units 11/26/2023    3:23 PM 11/06/2023    8:04 PM 11/06/2023    9:39 AM  Hepatic Function  Total Protein 6.5 - 8.1 g/dL  6.6  6.6  7.0   Albumin 3.5 - 5.0 g/dL 3.8  3.6  4.1   AST 15 - 41 U/L 17  16  15    ALT 0 - 44 U/L 11  13  9    Alk Phosphatase 38 - 126 U/L 42  37  37   Total Bilirubin 0.0 - 1.2 mg/dL 0.6  0.5  0.4       Current Medications:      Current Outpatient Medications (Analgesics):    acetaminophen  (TYLENOL ) 500 MG tablet, Take 1,000 mg by mouth as needed for mild pain (pain score 1-3).   Current Outpatient Medications (Other):    Calcium Carb-Cholecalciferol (CALCIUM + VITAMIN D3) 600-10 MG-MCG TABS, Take 1 tablet by mouth daily.   citalopram  (CELEXA ) 20 MG tablet, TAKE 1 TABLET(20 MG) BY MOUTH DAILY    Glucosamine HCl (GLUCOSAMINE PO), Take by mouth.   hyoscyamine (LEVSIN) 0.125 MG tablet, Take 1 tablet (0.125 mg total) by mouth every 6 (six) hours as needed for cramping.   pantoprazole  (PROTONIX ) 40 MG tablet, Take 1 tablet (40 mg total) by mouth daily.   sucralfate  (CARAFATE ) 1 g tablet, Take 1 tablet (1 g total) by mouth 4 (four) times daily.  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 Family History:  Her family history includes Alcohol abuse in her maternal grandfather; Appendicitis in her son; Diabetes in her paternal grandfather; Gallbladder disease in her father; Healthy in her sister and son; Hearing loss in her father and paternal grandfather; Heart disease in her paternal grandfather; Hyperlipidemia in her mother and paternal grandmother; Hypertension in her mother, paternal grandfather, and paternal grandmother; Ovarian cancer in her maternal grandmother; Stroke in her paternal grandfather; Ulcers in her father.  REVIEW OF SYSTEMS  : All other systems reviewed and negative except where noted in the History of Present Illness.  PHYSICAL EXAM: BP 100/70 (BP  Location: Left Arm, Patient Position: Sitting, Cuff Size: Normal)   Pulse 76   Ht 5' 6.5" (1.689 m) Comment: height measured without shoes  Wt 182 lb 8 oz (82.8 kg)   LMP 01/28/2024   BMI 29.01 kg/m  Physical Exam   GENERAL APPEARANCE: Well nourished, in no apparent distress, no edema in legs. HEENT: No cervical lymphadenopathy, unremarkable thyroid, sclerae anicteric, conjunctiva pink. RESPIRATORY: Respiratory effort normal, BS equal bilateral without rales, rhonchi, wheezing. CARDIO: RRR with no MRGs, peripheral pulses intact. ABDOMEN: Soft, non distended, active bowel sounds in all 4 quadrants, slight epigastric discomfort, no tenderness to palpation, no rebound, no mass appreciated, negative Carnett's sign. RECTAL: Declines. MUSCULOSKELETAL: Full ROM, normal gait, without edema. SKIN: Dry, intact  without rashes or lesions. No jaundice. NEURO: Alert, oriented, no focal deficits. PSYCH: Cooperative, normal mood and affect.      Edmonia Gottron, PA-C 9:37 AM

## 2024-02-12 ENCOUNTER — Encounter: Payer: Self-pay | Admitting: Physician Assistant

## 2024-02-12 ENCOUNTER — Encounter: Payer: Self-pay | Admitting: Pediatrics

## 2024-02-12 ENCOUNTER — Other Ambulatory Visit (INDEPENDENT_AMBULATORY_CARE_PROVIDER_SITE_OTHER)

## 2024-02-12 ENCOUNTER — Ambulatory Visit: Payer: Self-pay | Admitting: Physician Assistant

## 2024-02-12 ENCOUNTER — Ambulatory Visit (INDEPENDENT_AMBULATORY_CARE_PROVIDER_SITE_OTHER): Admitting: Physician Assistant

## 2024-02-12 VITALS — BP 100/70 | HR 76 | Ht 66.5 in | Wt 182.5 lb

## 2024-02-12 DIAGNOSIS — R140 Abdominal distension (gaseous): Secondary | ICD-10-CM

## 2024-02-12 DIAGNOSIS — K219 Gastro-esophageal reflux disease without esophagitis: Secondary | ICD-10-CM | POA: Diagnosis not present

## 2024-02-12 DIAGNOSIS — R1013 Epigastric pain: Secondary | ICD-10-CM

## 2024-02-12 DIAGNOSIS — R1114 Bilious vomiting: Secondary | ICD-10-CM

## 2024-02-12 DIAGNOSIS — R110 Nausea: Secondary | ICD-10-CM

## 2024-02-12 DIAGNOSIS — R634 Abnormal weight loss: Secondary | ICD-10-CM

## 2024-02-12 LAB — CBC WITH DIFFERENTIAL/PLATELET
Basophils Absolute: 0 10*3/uL (ref 0.0–0.1)
Basophils Relative: 0.7 % (ref 0.0–3.0)
Eosinophils Absolute: 0.2 10*3/uL (ref 0.0–0.7)
Eosinophils Relative: 2.2 % (ref 0.0–5.0)
HCT: 40.1 % (ref 36.0–46.0)
Hemoglobin: 13.6 g/dL (ref 12.0–15.0)
Lymphocytes Relative: 24.6 % (ref 12.0–46.0)
Lymphs Abs: 1.8 10*3/uL (ref 0.7–4.0)
MCHC: 34 g/dL (ref 30.0–36.0)
MCV: 93.1 fl (ref 78.0–100.0)
Monocytes Absolute: 0.6 10*3/uL (ref 0.1–1.0)
Monocytes Relative: 7.7 % (ref 3.0–12.0)
Neutro Abs: 4.9 10*3/uL (ref 1.4–7.7)
Neutrophils Relative %: 64.8 % (ref 43.0–77.0)
Platelets: 250 10*3/uL (ref 150.0–400.0)
RBC: 4.31 Mil/uL (ref 3.87–5.11)
RDW: 12.9 % (ref 11.5–15.5)
WBC: 7.5 10*3/uL (ref 4.0–10.5)

## 2024-02-12 LAB — COMPREHENSIVE METABOLIC PANEL WITH GFR
ALT: 10 U/L (ref 0–35)
AST: 14 U/L (ref 0–37)
Albumin: 4.3 g/dL (ref 3.5–5.2)
Alkaline Phosphatase: 39 U/L (ref 39–117)
BUN: 14 mg/dL (ref 6–23)
CO2: 29 meq/L (ref 19–32)
Calcium: 9 mg/dL (ref 8.4–10.5)
Chloride: 104 meq/L (ref 96–112)
Creatinine, Ser: 0.7 mg/dL (ref 0.40–1.20)
GFR: 106.26 mL/min (ref >60.00)
Glucose, Bld: 88 mg/dL (ref 70–99)
Potassium: 3.9 meq/L (ref 3.5–5.1)
Sodium: 138 meq/L (ref 135–145)
Total Bilirubin: 0.7 mg/dL (ref 0.2–1.2)
Total Protein: 6.7 g/dL (ref 6.0–8.3)

## 2024-02-12 LAB — SEDIMENTATION RATE: Sed Rate: 1 mm/h (ref 0–20)

## 2024-02-12 MED ORDER — SUCRALFATE 1 G PO TABS: 1.0000 g | ORAL_TABLET | Freq: Four times a day (QID) | ORAL | 0 refills | Status: AC

## 2024-02-12 MED ORDER — HYOSCYAMINE SULFATE 0.125 MG PO TABS: 0.1250 mg | ORAL_TABLET | Freq: Four times a day (QID) | ORAL | 0 refills | Status: DC | PRN

## 2024-02-12 NOTE — Patient Instructions (Addendum)
 Your provider has requested that you go to the basement level for lab work before leaving today. Press "B" on the elevator. The lab is located at the first door on the left as you exit the elevator.   Please take your proton pump inhibitor medication, protonix  40 mg  Please take this medication 30 minutes to 1 hour before meals- this makes it more effective.  Avoid spicy and acidic foods Avoid fatty foods Limit your intake of coffee, tea, alcohol, and carbonated drinks Work to maintain a healthy weight Keep the head of the bed elevated at least 3 inches with blocks or a wedge pillow if you are having any nighttime symptoms Stay upright for 2 hours after eating Avoid meals and snacks three to four hours before bedtime   Gastroparesis Please do small frequent meals like 4-6 meals a day.  Eat and drink liquids at separate times.  Avoid high fiber foods, cook your vegetables, avoid high fat food.  Suggest spreading protein throughout the day (greek yogurt, glucerna, soft meat, milk, eggs) Choose soft foods that you can mash with a fork When you are more symptomatic, change to pureed foods foods and liquids.  Consider reading "Living well with Gastroparesis" by Creasie Doctor Gastroparesis is a condition in which food takes longer than normal to empty from the stomach. This condition is also known as delayed gastric emptying. It is usually a long-term (chronic) condition. There is no cure, but there are treatments and things that you can do at home to help relieve symptoms. Treating the underlying condition that causes gastroparesis can also help relieve symptoms What are the causes? In many cases, the cause of this condition is not known. Possible causes include: A hormone (endocrine) disorder, such as hypothyroidism or diabetes. A nervous system disease, such as Parkinson's disease or multiple sclerosis. Cancer, infection, or surgery that affects the stomach or vagus nerve. The vagus  nerve runs from your chest, through your neck, and to the lower part of your brain. A connective tissue disorder, such as scleroderma. Certain medicines. What increases the risk? You are more likely to develop this condition if: You have certain disorders or diseases. These may include: An endocrine disorder. An eating disorder. Amyloidosis. Scleroderma. Parkinson's disease. Multiple sclerosis. Cancer or infection of the stomach or the vagus nerve. You have had surgery on your stomach or vagus nerve. You take certain medicines. You are female. What are the signs or symptoms? Symptoms of this condition include: Feeling full after eating very little or a loss of appetite. Nausea, vomiting, or heartburn. Bloating of your abdomen. Inconsistent blood sugar (glucose) levels on blood tests. Unexplained weight loss. Acid from the stomach coming up into the esophagus (gastroesophageal reflux). Sudden tightening (spasm) of the stomach, which can be painful. Symptoms may come and go. Some people may not notice any symptoms. How is this diagnosed? This condition is diagnosed with tests, such as: Tests that check how long it takes food to move through the stomach and intestines. These tests include: Upper gastrointestinal (GI) series. For this test, you drink a liquid that shows up well on X-rays, and then X-rays are taken of your intestines. Gastric emptying scintigraphy. For this test, you eat food that contains a small amount of radioactive material, and then scans are taken. Wireless capsule GI monitoring system. For this test, you swallow a pill (capsule) that records information about how foods and fluid move through your stomach. Gastric manometry. For this test, a tube is passed down  your throat and into your stomach to measure electrical and muscular activity. Endoscopy. For this test, a long, thin tube with a camera and light on the end is passed down your throat and into your stomach  to check for problems in your stomach lining. Ultrasound. This test uses sound waves to create images of the inside of your body. This can help rule out gallbladder disease or pancreatitis as a cause of your symptoms. How is this treated? There is no cure for this condition, but treatment and home care may relieve symptoms. Treatment may include: Treating the underlying cause. Managing your symptoms by making changes to your diet and exercise habits. Taking medicines to control nausea and vomiting and to stimulate stomach muscles. Getting food through a feeding tube in the hospital. This may be done in severe cases. Having surgery to insert a device called a gastric electrical stimulator into your body. This device helps improve stomach emptying and control nausea and vomiting. Follow these instructions at home: Take over-the-counter and prescription medicines only as told by your health care provider. Follow instructions from your health care provider about eating or drinking restrictions. Your health care provider may recommend that you: Eat smaller meals more often. Eat low-fat foods. Eat low-fiber forms of high-fiber foods. For example, eat cooked vegetables instead of raw vegetables. Have only liquid foods instead of solid foods. Liquid foods are easier to digest. Drink enough fluid to keep your urine pale yellow. Exercise as often as told by your health care provider. Keep all follow-up visits. This is important. Contact a health care provider if you: Notice that your symptoms do not improve with treatment. Have new symptoms. Get help right away if you: Have severe pain in your abdomen that does not improve with treatment. Have nausea that is severe or does not go away. Vomit every time you drink fluids. Summary Gastroparesis is a long-term (chronic) condition in which food takes longer than normal to empty from the stomach. Symptoms include nausea, vomiting, heartburn, bloating of  your abdomen, and loss of appetite. Eating smaller portions, low-fat foods, and low-fiber forms of high-fiber foods may help you manage your symptoms. Get help right away if you have severe pain in your abdomen. This information is not intended to replace advice given to you by your health care provider. Make sure you discuss any questions you have with your health care provider. Document Revised: 01/05/2020 Document Reviewed: 01/05/2020 Elsevier Patient Education  2021 [employer]  You have been scheduled for an endoscopy. Please follow written instructions given to you at your visit today.  If you use inhalers (even only as needed), please bring them with you on the day of your procedure.  If you take any of the following medications, they will need to be adjusted prior to your procedure:   DO NOT TAKE 7 DAYS PRIOR TO TEST- Trulicity (dulaglutide) Ozempic, Wegovy (semaglutide) Mounjaro (tirzepatide) Bydureon Bcise (exanatide extended release)  DO NOT TAKE 1 DAY PRIOR TO YOUR TEST Rybelsus (semaglutide) Adlyxin (lixisenatide) Victoza (liraglutide) Byetta (exanatide) ___________________________________________________________________________    Due to recent changes in healthcare laws, you may see the results of your imaging and laboratory studies on MyChart before your provider has had a chance to review them.  We understand that in some cases there may be results that are confusing or concerning to you. Not all laboratory results come back in the same time frame and the provider may be waiting for multiple results in order to interpret others.  Please give us  48 hours in order for your provider to thoroughly review all the results before contacting the office for clarification of your results.    I appreciate the  opportunity to care for you  Thank You   [facility]

## 2024-02-13 LAB — IGA: Immunoglobulin A: 303 mg/dL (ref 47–310)

## 2024-02-13 LAB — TISSUE TRANSGLUTAMINASE, IGA: (tTG) Ab, IgA: <1 U/mL

## 2024-02-17 NOTE — Progress Notes (Unsigned)
 [facility] History and Physical   Primary Care Physician:  Abram Abraham, NP-C   Reason for Procedure:  Epigastric abdominal pain, nausea, vomiting, bloating, early satiety, weight loss  Plan:    Upper endoscopy     HPI: Jasmine Bullock is a 43 y.o. female undergoing upper endoscopy for investigation of epigastric abdominal pain, nausea, vomiting, bloating, early satiety and weight loss.  Patient reports first onset of GI symptoms in 2021 after taking meloxicam  for foot pain.  Since that time H. pylori breath test has been negative x 2.  Developed severe abdominal pain in February 2025-cardiac etiologies ruled out and negative ultrasound of gallbladder.  Subsequent CT showed appendectomy.  Symptoms have persisted post appendectomy.  Symptoms are exacerbated by the ingestion of large salads, carbohydrates and carbonated beverages.   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 Authorizing Provider  acetaminophen  (TYLENOL )  500 MG tablet Take 1,000 mg by mouth as needed for mild pain (pain score 1-3).    [provider]  Calcium Carb-Cholecalciferol (CALCIUM + VITAMIN D3) 600-10 MG-MCG TABS Take 1 tablet by mouth daily.    [provider]  citalopram  (CELEXA ) 20 MG tablet TAKE 1 TABLET(20 MG) BY MOUTH DAILY 02/06/24   Henson, Vickie L, NP-C  Glucosamine HCl (GLUCOSAMINE PO) Take by mouth.    [provider]  hyoscyamine  (LEVSIN) 0.125 MG tablet Take 1 tablet (0.125 mg total) by mouth every 6 (six) hours as needed for cramping. 02/12/24   Edmonia Gottron, PA-C  pantoprazole  (PROTONIX ) 40 MG tablet Take 1 tablet (40 mg total) by mouth daily. 11/27/23   Roslyn Coombe, MD  sucralfate  (CARAFATE ) 1 g tablet Take 1 tablet (1 g total) by mouth 4 (four) times daily. 02/12/24   Edmonia Gottron, PA-C    Current Outpatient Medications  Medication Sig Dispense Refill   acetaminophen  (TYLENOL ) 500 MG tablet Take 1,000 mg by mouth as needed for mild pain (pain score 1-3).     Calcium Carb-Cholecalciferol (CALCIUM + VITAMIN D3) 600-10 MG-MCG TABS Take 1 tablet by mouth daily.     citalopram  (CELEXA ) 20 MG tablet TAKE 1 TABLET(20 MG) BY MOUTH DAILY 90 tablet 0   Glucosamine HCl (GLUCOSAMINE PO) Take by mouth.     pantoprazole  (PROTONIX ) 40 MG tablet Take 1 tablet (40 mg total) by mouth daily. 90 tablet 3   hyoscyamine  (LEVSIN) 0.125 MG tablet Take 1 tablet (0.125 mg total)  by mouth every 6 (six) hours as needed for cramping. 30 tablet 0   sucralfate  (CARAFATE ) 1 g tablet Take 1 tablet (1 g total) by mouth 4 (four) times daily. 120 tablet 0   Current Facility-Administered Medications  Medication Dose Route Frequency Provider Last Rate Last Admin   0.9 %  sodium chloride  infusion  500 mL Intravenous Once Trini Soldo, Scarlette Currier, MD        Allergies as of 02/18/2024   (No Known Allergies)    Family History  Problem Relation Age of Onset   Hypertension Mother    Hyperlipidemia Mother    Ulcers Father     Hearing loss Father    Gallbladder disease Father    Healthy Sister    Ovarian cancer Maternal Grandmother    Alcohol abuse Maternal Grandfather    Hypertension Paternal Grandmother    Hyperlipidemia Paternal Grandmother    Diabetes Paternal Grandfather    Heart disease Paternal Grandfather    Hearing loss Paternal Grandfather    Hypertension Paternal Grandfather    Stroke Paternal Grandfather    Appendicitis Son    Healthy Son    Colon cancer Neg Hx    Rectal cancer Neg Hx    Esophageal cancer Neg Hx    Breast cancer Neg Hx     Social History   Socioeconomic History   Marital status: Married    Spouse name: Not on file   Number of children: 2   Years of education: Not on file   Highest education level: Bachelor's degree (e.g., BA, AB, BS)  Occupational History   Occupation: dental hygentist   Tobacco Use   Smoking status: Former    Current packs/day: 0.00    Average packs/day: 0.3 packs/day for 5.2 years (1.3 ttl pk-yrs)    Types: Cigarettes    Start date: 02/10/1999    Quit date: 02/10/2004    Years since quitting: 20.0   Smokeless tobacco: Never   Tobacco comments:    Light social smoker in college  Vaping Use   Vaping status: Never Used  Substance and Sexual Activity   Alcohol use: Yes    Comment: Rarely   Drug use: No   Sexual activity: Yes    Partners: Male    Birth control/protection: Condom, None    Comment: Delivered baby boy in August  Other Topics Concern   Not on file  Social History Narrative   Marital status: Married since 2009   [id number]   Social Drivers of Health   Financial Resource Strain: Low Risk  (09/26/2023)   Overall Financial Resource Strain (CARDIA)    Difficulty of Paying Living Expenses: Not hard at all  Food Insecurity: No Food Insecurity (09/26/2023)   Hunger Vital Sign    Worried About Running Out of Food in the Last Year: Never true    Ran Out of Food in the Last Year: Never true  Transportation Needs: No Transportation Needs  (09/26/2023)   PRAPARE - Administrator, Civil Service (Medical): No    Lack of Transportation (Non-Medical): No  Physical Activity: Insufficiently Active (09/26/2023)   Exercise Vital Sign    Days of Exercise per Week: 3 days    Minutes of Exercise per Session: 40 min  Stress: No Stress Concern Present (09/26/2023)   [name] of Occupational Health - Occupational Stress Questionnaire    Feeling of Stress : Only a little  Social Connections: Socially Integrated (09/26/2023)   Social Connection and Isolation Panel [  NHANES]    Frequency of Communication with Friends and Family: More than three times a week    Frequency of Social Gatherings with Friends and Family: More than three times a week    Attends Religious Services: More than 4 times per year    Active Member of [employer] or Organizations: Yes    Attends Engineer, structural: More than 4 times per year    Marital Status: Married  Catering manager Violence: Not on file    Review of Systems:  All other review of systems negative except as mentioned in the HPI.  Physical Exam: Vital signs BP 112/67   Pulse 62   Temp 98.2 F (36.8 C) (Temporal)   Resp (!) 21   Ht 5' 6.5" (1.689 m)   Wt 182 lb (82.6 kg)   LMP 01/28/2024 (Exact Date)   SpO2 100%   BMI 28.94 kg/m   General:   Alert,  Well-developed, well-nourished, pleasant and cooperative in NAD Airway:  Mallampati 1 Lungs:  Clear throughout to auscultation.   Heart:  Regular rate and rhythm; no murmurs, clicks, rubs,  or gallops. Abdomen:  Soft, nontender and nondistended. Normal bowel sounds.   Neuro/Psych:  Normal mood and affect. A and O x 3  Eugenia Hess, MD [facility]

## 2024-02-18 ENCOUNTER — Ambulatory Visit (AMBULATORY_SURGERY_CENTER): Admitting: Pediatrics

## 2024-02-18 ENCOUNTER — Encounter: Payer: Self-pay | Admitting: Pediatrics

## 2024-02-18 VITALS — BP 125/78 | HR 78 | Temp 98.2°F | Resp 16 | Ht 66.5 in | Wt 182.0 lb

## 2024-02-18 DIAGNOSIS — K2950 Unspecified chronic gastritis without bleeding: Secondary | ICD-10-CM

## 2024-02-18 DIAGNOSIS — R634 Abnormal weight loss: Secondary | ICD-10-CM

## 2024-02-18 DIAGNOSIS — K3189 Other diseases of stomach and duodenum: Secondary | ICD-10-CM

## 2024-02-18 DIAGNOSIS — R6881 Early satiety: Secondary | ICD-10-CM

## 2024-02-18 DIAGNOSIS — R1013 Epigastric pain: Secondary | ICD-10-CM

## 2024-02-18 DIAGNOSIS — R140 Abdominal distension (gaseous): Secondary | ICD-10-CM

## 2024-02-18 DIAGNOSIS — R112 Nausea with vomiting, unspecified: Secondary | ICD-10-CM

## 2024-02-18 MED ORDER — SODIUM CHLORIDE 0.9 % IV SOLN: 500.0000 mL | Freq: Once | INTRAVENOUS | Status: DC

## 2024-02-18 NOTE — Patient Instructions (Signed)

## 2024-02-18 NOTE — Progress Notes (Signed)
 Pt's states no medical or surgical changes since previsit or office visit.

## 2024-02-18 NOTE — Progress Notes (Signed)
 Report to PACU, RN, vss, BBS= Clear.

## 2024-02-18 NOTE — Op Note (Signed)
 [facility] Patient Name: Jasmine Bullock Procedure Date: 02/18/2024 8:27 AM MRN: [id number] Endoscopist: Eugenia Hess , MD, [id number] Age: 43 Referring MD:  Date of Birth: [birth year] Gender: Female Account #: [id number] Procedure:                Upper GI endoscopy Indications:              Epigastric abdominal pain, Abdominal bloating,                            Early satiety, Nausea with vomiting, Weight loss, Medicines:                Monitored Anesthesia Care Procedure:                Pre-Anesthesia Assessment:                           - Prior to the procedure, a History and Physical                            was performed, and patient medications and                            allergies were reviewed. The patient's tolerance of                            previous anesthesia was also reviewed. The risks                            and benefits of the procedure and the sedation                            options and risks were discussed with the patient.                            All questions were answered, and informed consent                            was obtained. Prior Anticoagulants: The patient has                            taken no anticoagulant or antiplatelet agents. ASA                            Grade Assessment: II - A patient with mild systemic                            disease. After reviewing the risks and benefits,                            the patient was deemed in satisfactory condition to                            undergo the procedure.  After obtaining informed consent, the endoscope was                            passed under direct vision. Throughout the                            procedure, the patient's blood pressure, pulse, and                            oxygen saturations were monitored continuously. The                            Olympus Scope [id number] was introduced through the                            mouth,  and advanced to the second part of duodenum.                            The upper GI endoscopy was accomplished without                            difficulty. The patient tolerated the procedure                            well. Scope In: 8:34:51 AM Scope Out: 8:41:08 AM Total Procedure Duration: 0 hours 6 minutes 17 seconds  Findings:                 The examined esophagus was normal.                           The gastric body, gastric antrum, cardia (on                            retroflexion) and gastric fundus (on retroflexion)                            were normal. Biopsies were taken with a cold                            forceps for Helicobacter pylori testing.                           The duodenal bulb and second portion of the                            duodenum were normal. Biopsies for histology were                            taken with a cold forceps for evaluation of celiac                            disease. Complications:            No immediate complications. Estimated blood loss:  Minimal. Estimated Blood Loss:     Estimated blood loss was minimal. Impression:               - Normal esophagus.                           - Normal gastric body, antrum, cardia and gastric                            fundus. Biopsied.                           - Normal duodenal bulb and second portion of the                            duodenum. Biopsied. Recommendation:           - Discharge patient to home (ambulatory).                           - Await pathology results.                           - Continue present medications.                           - The findings and recommendations were discussed                            with the patient.                           - Return to GI clinic in 2 months with Dr. Yvone Herd                            or APP.                           - Patient has a contact number available for                             emergencies. The signs and symptoms of potential                            delayed complications were discussed with the                            patient. Return to normal activities tomorrow.                            Written discharge instructions were provided to the                            patient. Eugenia Hess, MD 02/18/2024 8:45:36 AM This report has been signed electronically.

## 2024-02-19 ENCOUNTER — Telehealth: Payer: Self-pay

## 2024-02-19 NOTE — Telephone Encounter (Signed)
 No answer, left message to call if having any issues or concerns, B.Vale Mousseau RN

## 2024-02-20 ENCOUNTER — Ambulatory Visit: Payer: Self-pay | Admitting: Pediatrics

## 2024-02-20 LAB — SURGICAL PATHOLOGY

## 2024-02-21 ENCOUNTER — Telehealth: Payer: Self-pay | Admitting: Pediatrics

## 2024-02-21 DIAGNOSIS — R140 Abdominal distension (gaseous): Secondary | ICD-10-CM

## 2024-02-21 DIAGNOSIS — R6881 Early satiety: Secondary | ICD-10-CM

## 2024-02-21 DIAGNOSIS — R634 Abnormal weight loss: Secondary | ICD-10-CM

## 2024-02-21 DIAGNOSIS — R1013 Epigastric pain: Secondary | ICD-10-CM

## 2024-02-21 DIAGNOSIS — R112 Nausea with vomiting, unspecified: Secondary | ICD-10-CM

## 2024-02-21 NOTE — Telephone Encounter (Signed)
 Called patient and reviewed pathology results. Patient wanted to know if she had to take Protonix  40 mg indefinitely, I told patient that we typically like patient to be on the lowest dose of PPI that controls symptoms. Patient has been advised that she can continue Protonix  40 mg daily  for a few more weeks then take every other day. Patient will continue Carafate  PRN. Patient states that she would like to proceed with GES. Patient knows to expect a call from radiology scheduling to set up her appt, patient knows that I will send this information to her MyChart as well. I answered all of patient's questions. Patient verbalized understanding and had no concerns at the end of the call.   GES order in epic. Secure staff message sent to radiology scheduling to contact patient to set up appt.

## 2024-02-21 NOTE — Telephone Encounter (Signed)
 Patient is calling back regarding going over her biopsy results. Patient stated that she received a call from the Dr. Yvone Herd, but she has questions about her results. Patient is requesting a call back. Please advise.

## 2024-02-26 NOTE — Telephone Encounter (Signed)
 GES scheduled for 03/18/24 at 8 am

## 2024-03-18 ENCOUNTER — Encounter (HOSPITAL_COMMUNITY)
Admission: RE | Admit: 2024-03-18 | Discharge: 2024-03-18 | Disposition: A | Discharge status: Home / Self Care | Source: Ambulatory Visit | Attending: Pediatrics | Admitting: Pediatrics

## 2024-03-18 ENCOUNTER — Encounter (HOSPITAL_COMMUNITY): Payer: Self-pay

## 2024-03-18 DIAGNOSIS — R140 Abdominal distension (gaseous): Secondary | ICD-10-CM | POA: Insufficient documentation

## 2024-03-18 DIAGNOSIS — R1013 Epigastric pain: Secondary | ICD-10-CM | POA: Insufficient documentation

## 2024-03-18 DIAGNOSIS — R112 Nausea with vomiting, unspecified: Secondary | ICD-10-CM | POA: Diagnosis present

## 2024-03-18 DIAGNOSIS — R6881 Early satiety: Secondary | ICD-10-CM | POA: Diagnosis present

## 2024-03-18 DIAGNOSIS — R634 Abnormal weight loss: Secondary | ICD-10-CM | POA: Insufficient documentation

## 2024-03-18 MED ORDER — TECHNETIUM TC 99M SULFUR COLLOID
2.0000 | Freq: Once | INTRAVENOUS | Status: AC
  Administered 2024-03-18: 2.03 via ORAL

## 2024-03-19 ENCOUNTER — Ambulatory Visit: Payer: Self-pay | Admitting: Pediatrics

## 2024-04-08 ENCOUNTER — Encounter: Payer: Self-pay | Admitting: Family Medicine

## 2024-04-08 NOTE — Telephone Encounter (Signed)
 Pt would like advise if she needs to see sports med or just give it time as it may just be inflammation?

## 2024-04-09 ENCOUNTER — Encounter: Payer: Self-pay | Admitting: Family Medicine

## 2024-04-09 ENCOUNTER — Ambulatory Visit: Admitting: Family Medicine

## 2024-04-09 VITALS — BP 112/74 | HR 56 | Ht 66.5 in | Wt 183.0 lb

## 2024-04-09 DIAGNOSIS — M222X1 Patellofemoral disorders, right knee: Secondary | ICD-10-CM | POA: Diagnosis not present

## 2024-04-09 DIAGNOSIS — M222X2 Patellofemoral disorders, left knee: Secondary | ICD-10-CM | POA: Diagnosis not present

## 2024-04-09 MED ORDER — CELECOXIB 100 MG PO CAPS: 100.0000 mg | ORAL_CAPSULE | Freq: Two times a day (BID) | ORAL | 0 refills | Status: DC

## 2024-04-09 NOTE — Assessment & Plan Note (Signed)
 Given injection and tolerated the procedure well, discussed with patient about icing regimen of home exercises, discussed with patient about Celebrex  100 mg twice a day.  Wants to avoid any type of oral NSAIDs for a long period of time with patient having some stomach issues.  Increase activity slowly otherwise.  Follow-up again in 6 to 8 weeks.

## 2024-04-09 NOTE — Patient Instructions (Signed)
 Celebrex  100mg  BID Injection in knee Ice  Exercises See me again in 2 months

## 2024-04-09 NOTE — Progress Notes (Addendum)
 [name] [address] [place] [address] Phone: [phone] Subjective:   LILLETTE Berwyn Posey, am serving as a scribe for Dr. Arthea Claudene.  I'm seeing this patient by the request  of:  Henson, Vickie L, NP-C  CC: Bilateral knee pain  YEP:[name]  COILA WARDELL is a 43 y.o. female coming in with complaint of B knee pain. Patient states that her pain started over weekend. Notices buckling intermittent. Pain throughout the joint. Feels stiffness of VMO. Stairs increase her pain. Also had pain in her hips over the weekend. That has eased but the pain in knee persists.      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   GERD (gastroesophageal reflux disease) 01/28/2020   Plantar fasciitis    left foot   Postpartum hemorrhage 1348cc 04/16/2022   Pregnancy 09/19/2021   Past Surgical History:  Procedure Laterality Date   L wrist surgery     LAPAROSCOPIC APPENDECTOMY N/A 11/07/2023   Procedure: APPENDECTOMY LAPAROSCOPIC;  Surgeon: Belinda Cough, MD;  Location: [facility] OR;  Service: General;  Laterality: N/A;   PLANTAR FASCIA RELEASE Left 01/27/2020   WISDOM TOOTH EXTRACTION     Social History   Socioeconomic History   Marital status: Married    Spouse name: Not on file   Number of children: 2   Years of education: Not on file   Highest education level: Bachelor's degree (e.g., BA, AB, BS)  Occupational History   Occupation: dental hygentist   Tobacco Use   Smoking status: Former    Current packs/day: 0.00    Average packs/day:  0.3 packs/day for 5.2 years (1.3 ttl pk-yrs)    Types: Cigarettes    Start date: 02/10/1999    Quit date: 02/10/2004    Years since quitting: 20.1   Smokeless tobacco: Never   Tobacco comments:    Light social smoker in college  Vaping Use   Vaping status: Never Used  Substance and Sexual Activity   Alcohol use: Yes    Comment: Rarely   Drug use: No   Sexual activity: Yes    Partners: Male    Birth control/protection: Condom, None    Comment: Delivered baby boy in August  Other Topics Concern   Not on file  Social History Narrative   Marital status: Married since 2009   G4P2022   Social Drivers of Health   Financial Resource Strain: Low Risk  (09/26/2023)   Overall Financial Resource Strain (CARDIA)    Difficulty of Paying Living Expenses: Not hard at all  Food Insecurity: No Food Insecurity (09/26/2023)   Hunger Vital Sign    Worried About Running Out of Food in the Last Year: Never true    Ran Out of Food in the Last Year: Never true  Transportation Needs: No Transportation Needs (09/26/2023)   PRAPARE - Administrator, Civil Service (Medical): No    Lack of Transportation (Non-Medical): No  Physical Activity: Insufficiently Active (09/26/2023)   Exercise Vital  Sign    Days of Exercise per Week: 3 days    Minutes of Exercise per Session: 40 min  Stress: No Stress Concern Present (09/26/2023)   [name] of Occupational Health - Occupational Stress Questionnaire    Feeling of Stress : Only a little  Social Connections: Socially Integrated (09/26/2023)   Social Connection and Isolation Panel    Frequency of Communication with Friends and Family: More than three times a week    Frequency of Social Gatherings with Friends and Family: More than three times a week    Attends Religious Services: More than 4 times per year    Active Member of [employer] or Organizations: Yes    Attends Engineer, structural: More than 4 times per year    Marital Status:  Married   No Known Allergies Family History  Problem Relation Age of Onset   Hypertension Mother    Hyperlipidemia Mother    Ulcers Father    Hearing loss Father    Gallbladder disease Father    Healthy Sister    Ovarian cancer Maternal Grandmother    Alcohol abuse Maternal Grandfather    Hypertension Paternal Grandmother    Hyperlipidemia Paternal Grandmother    Diabetes Paternal Grandfather    Heart disease Paternal Grandfather    Hearing loss Paternal Grandfather    Hypertension Paternal Grandfather    Stroke Paternal Grandfather    Appendicitis Son    Healthy Son    Colon cancer Neg Hx    Rectal cancer Neg Hx    Esophageal cancer Neg Hx    Breast cancer Neg Hx        Current Outpatient Medications (Analgesics):    acetaminophen  (TYLENOL ) 500 MG tablet, Take 1,000 mg by mouth as needed for mild pain (pain score 1-3).   celecoxib  (CELEBREX ) 100 MG capsule, Take 1 capsule (100 mg total) by mouth 2 (two) times daily.   Current Outpatient Medications (Other):    Calcium Carb-Cholecalciferol (CALCIUM + VITAMIN D3) 600-10 MG-MCG TABS, Take 1 tablet by mouth daily.   citalopram  (CELEXA ) 20 MG tablet, TAKE 1 TABLET(20 MG) BY MOUTH DAILY   Glucosamine HCl (GLUCOSAMINE PO), Take by mouth.   hyoscyamine  (LEVSIN ) 0.125 MG tablet, Take 1 tablet (0.125 mg total) by mouth every 6 (six) hours as needed for cramping.   pantoprazole  (PROTONIX ) 40 MG tablet, Take 1 tablet (40 mg total) by mouth daily.   sucralfate  (CARAFATE ) 1 g tablet, Take 1 tablet (1 g total) by mouth 4 (four) times daily.   Reviewed prior external information including notes and imaging from  primary care provider As well as notes that were available from care everywhere and other healthcare systems.  Past medical history, social, surgical and family history all reviewed in electronic medical record.  No pertanent information unless stated regarding to the chief complaint.   Review of Systems:  No headache,  visual changes, nausea, vomiting, diarrhea, constipation, dizziness, abdominal pain, skin rash, fevers, chills, night sweats, weight loss, swollen lymph nodes, body aches, joint swelling, chest pain, shortness of breath, mood changes. POSITIVE muscle aches  Objective  Blood pressure 112/74, pulse (!) 56, height 5' 6.5 (1.689 m), weight 183 lb (83 kg), last menstrual period 03/18/2024, SpO2 99%, not currently breastfeeding.   General: No apparent distress alert and oriented x3 mood and affect normal, dressed appropriately.  HEENT: Pupils equal, extraocular movements intact  Respiratory: Patient's speak in full sentences and does not appear short of breath  Cardiovascular: No  lower extremity edema, non tender, no erythema  Bilateral knees do have trace effusion noted.  No crepitus noted.  Tightness noted with some crepitus.  No instability of the knee though noted.  Positive patellar grind worse left greater than right.  Limited muscular skeletal ultrasound was performed and interpreted by CLAUDENE HUSSAR, M  Limited ultrasound shows that both knees do have some hypoechoic changes consistent with some patellofemoral syndrome.  Narrowing of the lateral patellofemoral bilaterally.  Thickening of the synovial lining on the left knee consistent with a synovitis. 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 Left knee was prepped with alcohol swab and utilizing anterolateral approach, patient's left knee space was injected with 4:1  marcaine  0.5%: Kenalog 40mg /dL. Patient tolerated the procedure well without immediate complications.   Impression and Recommendations:    The above documentation has been reviewed and is accurate and complete Ahlana Slaydon M Brigitte Soderberg, DO

## 2024-04-28 ENCOUNTER — Ambulatory Visit (INDEPENDENT_AMBULATORY_CARE_PROVIDER_SITE_OTHER): Admitting: Family Medicine

## 2024-04-28 ENCOUNTER — Ambulatory Visit: Payer: Self-pay | Admitting: Family Medicine

## 2024-04-28 ENCOUNTER — Encounter: Payer: Self-pay | Admitting: Family Medicine

## 2024-04-28 VITALS — BP 102/70 | HR 66 | Temp 98.3°F | Ht 66.5 in | Wt 179.0 lb

## 2024-04-28 DIAGNOSIS — N760 Acute vaginitis: Secondary | ICD-10-CM

## 2024-04-28 DIAGNOSIS — R6883 Chills (without fever): Secondary | ICD-10-CM | POA: Diagnosis not present

## 2024-04-28 DIAGNOSIS — R300 Dysuria: Secondary | ICD-10-CM | POA: Diagnosis not present

## 2024-04-28 LAB — CBC WITH DIFFERENTIAL/PLATELET
Basophils Absolute: 0 K/uL (ref 0.0–0.1)
Basophils Relative: 0.3 % (ref 0.0–3.0)
Eosinophils Absolute: 0.1 K/uL (ref 0.0–0.7)
Eosinophils Relative: 0.4 % (ref 0.0–5.0)
HCT: 39.4 % (ref 36.0–46.0)
Hemoglobin: 13.2 g/dL (ref 12.0–15.0)
Lymphocytes Relative: 9.6 % — ABNORMAL LOW (ref 12.0–46.0)
Lymphs Abs: 1.3 K/uL (ref 0.7–4.0)
MCHC: 33.4 g/dL (ref 30.0–36.0)
MCV: 92.5 fl (ref 78.0–100.0)
Monocytes Absolute: 1.1 K/uL — ABNORMAL HIGH (ref 0.1–1.0)
Monocytes Relative: 7.6 % (ref 3.0–12.0)
Neutro Abs: 11.5 K/uL — ABNORMAL HIGH (ref 1.4–7.7)
Neutrophils Relative %: 82.1 % — ABNORMAL HIGH (ref 43.0–77.0)
Platelets: 225 K/uL (ref 150.0–400.0)
RBC: 4.26 Mil/uL (ref 3.87–5.11)
RDW: 13.1 % (ref 11.5–15.5)
WBC: 14 K/uL — ABNORMAL HIGH (ref 4.0–10.5)

## 2024-04-28 LAB — COMPREHENSIVE METABOLIC PANEL WITH GFR
ALT: 12 U/L (ref 0–35)
AST: 16 U/L (ref 0–37)
Albumin: 3.9 g/dL (ref 3.5–5.2)
Alkaline Phosphatase: 42 U/L (ref 39–117)
BUN: 9 mg/dL (ref 6–23)
CO2: 27 meq/L (ref 19–32)
Calcium: 8.6 mg/dL (ref 8.4–10.5)
Chloride: 103 meq/L (ref 96–112)
Creatinine, Ser: 0.67 mg/dL (ref 0.40–1.20)
GFR: 107.23 mL/min (ref >60.00)
Glucose, Bld: 89 mg/dL (ref 70–99)
Potassium: 4.1 meq/L (ref 3.5–5.1)
Sodium: 137 meq/L (ref 135–145)
Total Bilirubin: 0.6 mg/dL (ref 0.2–1.2)
Total Protein: 6.3 g/dL (ref 6.0–8.3)

## 2024-04-28 LAB — POCT URINALYSIS DIP (CLINITEK)
Bilirubin, UA: NEGATIVE
Glucose, UA: NEGATIVE mg/dL
Ketones, POC UA: NEGATIVE mg/dL
Nitrite, UA: NEGATIVE
Spec Grav, UA: 1.01 (ref 1.010–1.025)
Urobilinogen, UA: 0.2 U/dL
pH, UA: 8 (ref 5.0–8.0)

## 2024-04-28 LAB — POC COVID19 BINAXNOW: SARS Coronavirus 2 Ag: NEGATIVE

## 2024-04-28 MED ORDER — NITROFURANTOIN MONOHYD MACRO 100 MG PO CAPS
100.0000 mg | ORAL_CAPSULE | Freq: Two times a day (BID) | ORAL | 0 refills | Status: AC
Start: 2024-04-28 — End: 2024-05-03

## 2024-04-28 NOTE — Progress Notes (Signed)
 Acute Office Visit  Subjective:     Patient ID: Jasmine Bullock, female    DOB: May 06, 1981, 43 y.o.   MRN: [id number]  Chief Complaint  Patient presents with   Acute Visit    Symptoms started suddenly through the night. Burning, frequency, chills, and flank pains    HPI  Discussed the use of AI scribe software for clinical note transcription with the patient, who gave verbal consent to proceed.  History of Present Illness Jasmine Bullock is a 43 year old female who presents with symptoms suggestive of a urinary tract infection (UTI).  Dysuria and urinary symptoms - Sudden onset of dysuria occurring multiple times throughout the night - Presence of blood in urine without visible hematuria - Symptoms are different from previous urinary tract infections  Constitutional symptoms - Chills and generalized weakness present this morning - No fever  Flank and back pain - Mild back pain localized to the area of the kidneys  Exposure and allergies - No recent travel - Limited exposure to sick individuals - No known antibiotic allergies     ROS Per HPI      Objective:    BP 102/70   Pulse 66   Temp 98.3 F (36.8 C) (Temporal)   Ht 5' 6.5 (1.689 m)   Wt 179 lb (81.2 kg)   SpO2 100%   BMI 28.46 kg/m    Physical Exam Vitals and nursing note reviewed.  Constitutional:      General: She is not in acute distress.    Appearance: She is ill-appearing.     Comments: Appears fatigued  HENT:     Head: Normocephalic and atraumatic.     Right Ear: External ear normal.     Left Ear: External ear normal.     Nose: Nose normal.     Mouth/Throat:     Mouth: Mucous membranes are moist.     Pharynx: Oropharynx is clear.  Eyes:     Extraocular Movements: Extraocular movements intact.     Pupils: Pupils are equal, round, and reactive to light.  Cardiovascular:     Rate and Rhythm: Normal rate and regular rhythm.     Pulses: Normal pulses.     Heart sounds: Normal heart  sounds.  Pulmonary:     Effort: Pulmonary effort is normal. No respiratory distress.     Breath sounds: Normal breath sounds. No wheezing, rhonchi or rales.  Musculoskeletal:        General: Normal range of motion.     Cervical back: Normal range of motion.     Right lower leg: No edema.     Left lower leg: No edema.  Lymphadenopathy:     Cervical: Cervical adenopathy present.  Neurological:     General: No focal deficit present.     Mental Status: She is alert and oriented to person, place, and time.  Psychiatric:        Mood and Affect: Mood normal.        Thought Content: Thought content normal.     Results for orders placed or performed in visit on 04/28/24  POCT URINALYSIS DIP (CLINITEK)  Result Value Ref Range   Color, UA yellow yellow   Clarity, UA cloudy (A) clear   Glucose, UA negative negative mg/dL   Bilirubin, UA negative negative   Ketones, POC UA negative negative mg/dL   Spec Grav, UA 8.989 8.989 - 1.025   Blood, UA small (A) negative   pH, UA  8.0 5.0 - 8.0   POC PROTEIN,UA trace negative, trace   Urobilinogen, UA 0.2 0.2 or 1.0 E.U./dL   Nitrite, UA Negative Negative   Leukocytes, UA Moderate (2+) (A) Negative        Assessment & Plan:   Assessment and Plan Assessment & Plan Urinary tract infection Acute UTI with atypical symptoms including chills and weakness. Differential includes COVID-19 and influenza. Back pain not renal-related. - Prescribed Macrobid  100 mg BID for 5 days. - Sent urine for culture. - Ordered COVID-19 test. - If COVID-19 negative, consider renal function and WBC count.     Orders Placed This Encounter  Procedures   Urine Culture   CBC with Differential/Platelet    Release to patient:   Immediate [1]   Comprehensive metabolic panel with GFR    Release to patient:   Immediate [1]   POCT URINALYSIS DIP (CLINITEK)   POC COVID-19 BinaxNow     Meds ordered this encounter  Medications   nitrofurantoin ,  macrocrystal-monohydrate, (MACROBID ) 100 MG capsule    Sig: Take 1 capsule (100 mg total) by mouth 2 (two) times daily for 5 days.    Dispense:  10 capsule    Refill:  0    Return if symptoms worsen or fail to improve.  Corean LITTIE Ku, FNP

## 2024-04-28 NOTE — Patient Instructions (Signed)
 Your urine was concerning for infection in the office today. We have sent it to the lab for culture and confirmation.   I have sent in Macrobid  for you to take twice a day for 5 days.  Please take all of these antibiotics, even if you are beginning to feel better.  Continue supportive care at home, make sure you are drinking plenty of fluids and getting some rest.  May take ibuprofen  and Tylenol  as needed for fever, aches and pains.  Stay home and away from everyone until you are 24 hours fever free without fever reducing medications.  Follow-up with me if symptoms are persisting over the next week.  Follow-up in the emergency room if you are experiencing shortness of breath, high fever, unremitting cough, severe fatigue, other concerning symptoms.

## 2024-05-01 LAB — URINE CULTURE

## 2024-05-01 MED ORDER — FLUCONAZOLE 150 MG PO TABS
150.0000 mg | ORAL_TABLET | Freq: Once | ORAL | 0 refills | Status: AC
Start: 2024-05-01 — End: 2024-05-01

## 2024-05-09 ENCOUNTER — Other Ambulatory Visit: Payer: Self-pay | Admitting: Family Medicine

## 2024-05-09 MED ORDER — CELECOXIB 100 MG PO CAPS: 100.0000 mg | ORAL_CAPSULE | Freq: Two times a day (BID) | ORAL | 0 refills | Status: AC

## 2024-05-09 NOTE — Telephone Encounter (Signed)
 Copied from CRM [id number]. Topic: Clinical - Medication Refill >> May 09, 2024  1:28 PM Mercedes MATSU wrote: Medication:  citalopram  (CELEXA ) 20 MG tablet Patient is requesting a 90 day supply with a refill or two.  Has the patient contacted their pharmacy? Yes (Agent: If no, request that the patient contact the pharmacy for the refill. If patient does not wish to contact the pharmacy document the reason why and proceed with request.) (Agent: If yes, when and what did the pharmacy advise?)  This is the patient's preferred pharmacy:  [facility] [id number] [place], [place] - [address] [place] [address] Phone: [phone] Fax: [phone]  Is this the correct pharmacy for this prescription? Yes If no, delete pharmacy and type the correct one.   Has the prescription been filled recently? Yes  Is the patient out of the medication? Yes  Has the patient been seen for an appointment in the last year OR does the patient have an upcoming appointment? Yes  Can we respond through MyChart? Yes  Agent: Please be advised that Rx refills may take up to 3 business days. We ask that you follow-up with your pharmacy.

## 2024-05-13 ENCOUNTER — Other Ambulatory Visit: Payer: Self-pay | Admitting: Family Medicine

## 2024-05-13 NOTE — Telephone Encounter (Signed)
 Copied from CRM [id number]. Topic: Clinical - Medication Refill >> May 13, 2024  9:39 AM Jayma L wrote: Medication:  citalopram  (CELEXA ) 20 MG tablet ( asking for refills )   Has the patient contacted their pharmacy? Yes (Agent: If no, request that the patient contact the pharmacy for the refill. If patient does not wish to contact the pharmacy document the reason why and proceed with request.) (Agent: If yes, when and what did the pharmacy advise?)  This is the patient's preferred pharmacy:  [facility] [id number] [place], [place] - [address] [place] [address] Phone: [phone] Fax: [phone]  Is this the correct pharmacy for this prescription? Yes If no, delete pharmacy and type the correct one.   Has the prescription been filled recently? No  Is the patient out of the medication? No  Has the patient been seen for an appointment in the last year OR does the patient have an upcoming appointment? Yes  Can we respond through MyChart? Yes  Agent: Please be advised that Rx refills may take up to 3 business days. We ask that you follow-up with your pharmacy.

## 2024-05-16 ENCOUNTER — Telehealth: Payer: Self-pay | Admitting: Family Medicine

## 2024-05-16 ENCOUNTER — Other Ambulatory Visit: Payer: Self-pay

## 2024-05-16 DIAGNOSIS — F411 Generalized anxiety disorder: Secondary | ICD-10-CM

## 2024-05-16 DIAGNOSIS — Z8659 Personal history of other mental and behavioral disorders: Secondary | ICD-10-CM

## 2024-05-16 MED ORDER — CITALOPRAM HYDROBROMIDE 20 MG PO TABS: ORAL_TABLET | ORAL | 0 refills | Status: AC

## 2024-05-16 NOTE — Telephone Encounter (Signed)
 Patient called and she says she's already spoke to someone in the office.  Copied from CRM [id number]. Topic: Clinical - Prescription Issue >> May 16, 2024  3:38 PM Deleta RAMAN wrote: Reason for CRM: patient called frustrated about medication refill. Would like to know status

## 2024-05-16 NOTE — Telephone Encounter (Signed)
 Patient wants to be called today concerning her Celexa  refill request

## 2024-05-19 NOTE — Telephone Encounter (Signed)
 Called pt on 9/5 and this was addressed

## 2024-05-20 ENCOUNTER — Ambulatory Visit: Admitting: Family Medicine

## 2024-05-20 ENCOUNTER — Encounter: Payer: Self-pay | Admitting: Family Medicine

## 2024-05-20 ENCOUNTER — Ambulatory Visit (INDEPENDENT_AMBULATORY_CARE_PROVIDER_SITE_OTHER)

## 2024-05-20 ENCOUNTER — Other Ambulatory Visit: Payer: Self-pay

## 2024-05-20 VITALS — BP 108/72 | HR 69 | Ht 66.0 in | Wt 176.0 lb

## 2024-05-20 DIAGNOSIS — M222X2 Patellofemoral disorders, left knee: Secondary | ICD-10-CM

## 2024-05-20 DIAGNOSIS — M222X1 Patellofemoral disorders, right knee: Secondary | ICD-10-CM

## 2024-05-20 NOTE — Patient Instructions (Signed)
 Injection today Xray today You have 14 days to return or exchange your brace Call [phone], then return the brace to our office Do prescribed exercises at least 3x a week See you again in 2-3 months

## 2024-05-20 NOTE — Assessment & Plan Note (Signed)
 Patient responded extremely well on the contralateral side.  We do expect patient to do extremely well.  Discussed icing regimen and home exercises, which activities to do and which ones to avoid.  Increase activity slowly.  Discussed icing regimen.  Do believe that there is some lateral translation of the patella noted and given a Tru pull lite brace that I think will be beneficial for activity.  He can take anti-inflammatories as needed.  Follow-up again in 6 to 8 weeks

## 2024-05-20 NOTE — Progress Notes (Signed)
 [facility] Bull[facility] [address] [place] [address] Phone: [phone] Subjective:   ISusannah Bullock, am serving as a scribe for Dr. Arthea Claudene.  I'm seeing this patient by the request  of:  Henson, Vickie L, NP-C  CC: Right knee pain  YEP:[name]  Jasmine Bullock is a 43 y.o. female coming in with complaint of  right knee pain. Has been gradually getting worse and the more she plays tennis the worse it gets. Pain is more medial. Sore and throbby. Most pain is after playing. She does ice at home and take medications. Will use a topical. No radiating pain. Pain doesn't wake her at night.       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   GERD (gastroesophageal reflux disease) 01/28/2020   Plantar fasciitis    left foot   Postpartum hemorrhage 1348cc 04/16/2022   Pregnancy 09/19/2021   Past Surgical History:  Procedure Laterality Date   L wrist surgery     LAPAROSCOPIC APPENDECTOMY N/A 11/07/2023   Procedure: APPENDECTOMY LAPAROSCOPIC;  Surgeon: Belinda Cough, MD;  Location: [facility] OR;  Service: General;  Laterality: N/A;   PLANTAR FASCIA RELEASE Left 01/27/2020   WISDOM TOOTH EXTRACTION     Social History   Socioeconomic History   Marital status: Married    Spouse name: Not on file   Number of children: 2   Years of education: Not on file   Highest education level: Bachelor's degree (e.g., BA, AB, BS)  Occupational History   Occupation: dental hygentist   Tobacco Use   Smoking status: Former    Current packs/day: 0.00     Average packs/day: 0.3 packs/day for 5.2 years (1.3 ttl pk-yrs)    Types: Cigarettes    Start date: 02/10/1999    Quit date: 02/10/2004    Years since quitting: 20.2   Smokeless tobacco: Never   Tobacco comments:    Light social smoker in college  Vaping Use   Vaping status: Never Used  Substance and Sexual Activity   Alcohol use: Yes    Comment: Rarely   Drug use: No   Sexual activity: Yes    Partners: Male    Birth control/protection: Condom, None    Comment: Delivered baby boy in August  Other Topics Concern   Not on file  Social History Narrative   Marital status: Married since 2009   [id number]   Social Drivers of Health   Financial Resource Strain: Low Risk  (09/26/2023)   Overall Financial Resource Strain (CARDIA)    Difficulty of Paying Living Expenses: Not hard at all  Food Insecurity: No Food Insecurity (09/26/2023)   Hunger Vital Sign    Worried About Running Out of Food in the Last Year: Never true    Ran Out of Food in the Last Year: Never true  Transportation Needs: No Transportation Needs (09/26/2023)   PRAPARE - Administrator, Civil Service (Medical): No    Lack of Transportation (Non-Medical): No  Physical Activity: Insufficiently Active (09/26/2023)   Exercise Vital Sign    Days of Exercise per Week: 3 days    Minutes of Exercise per Session: 40 min  Stress: No Stress Concern Present (09/26/2023)   [name] of Occupational Health - Occupational Stress Questionnaire    Feeling of Stress : Only a little  Social Connections: Socially Integrated (09/26/2023)   Social Connection and Isolation Panel    Frequency of Communication with Friends and Family: More than three times a week    Frequency of Social Gatherings with Friends and Family: More than three times a week    Attends Religious Services: More than 4 times per year    Active Member of [employer] or Organizations: Yes    Attends Engineer, structural: More than 4 times per year     Marital Status: Married   No Known Allergies Family History  Problem Relation Age of Onset   Hypertension Mother    Hyperlipidemia Mother    Ulcers Father    Hearing loss Father    Gallbladder disease Father    Healthy Sister    Ovarian cancer Maternal Grandmother    Alcohol abuse Maternal Grandfather    Hypertension Paternal Grandmother    Hyperlipidemia Paternal Grandmother    Diabetes Paternal Grandfather    Heart disease Paternal Grandfather    Hearing loss Paternal Grandfather    Hypertension Paternal Grandfather    Stroke Paternal Grandfather    Appendicitis Son    Healthy Son    Colon cancer Neg Hx    Rectal cancer Neg Hx    Esophageal cancer Neg Hx    Breast cancer Neg Hx        Current Outpatient Medications (Analgesics):    acetaminophen  (TYLENOL ) 500 MG tablet, Take 1,000 mg by mouth as needed for mild pain (pain score 1-3).   celecoxib  (CELEBREX ) 100 MG capsule, Take 1 capsule (100 mg total) by mouth 2 (two) times daily.   Current Outpatient Medications (Other):    Calcium Carb-Cholecalciferol (CALCIUM + VITAMIN D3) 600-10 MG-MCG TABS, Take 1 tablet by mouth daily.   citalopram  (CELEXA ) 20 MG tablet, TAKE 1 TABLET(20 MG) BY MOUTH DAILY   Glucosamine HCl (GLUCOSAMINE PO), Take by mouth.   hyoscyamine  (LEVSIN ) 0.125 MG tablet, Take 1 tablet (0.125 mg total) by mouth every 6 (six) hours as needed for cramping.   pantoprazole  (PROTONIX ) 40 MG tablet, Take 1 tablet (40 mg total) by mouth daily.   sucralfate  (CARAFATE ) 1 g tablet, Take 1 tablet (1 g total) by mouth 4 (four) times daily.   Reviewed prior external information including notes and imaging from  primary care provider As well as notes that were available from care everywhere and other healthcare systems.  Past medical history, social, surgical and family history all reviewed in electronic medical record.  No pertanent information unless stated regarding to the chief complaint.   Review of Systems:   No headache, visual changes, nausea, vomiting, diarrhea, constipation, dizziness, abdominal pain, skin rash, fevers, chills, night sweats, weight loss, swollen lymph nodes, body aches, joint swelling, chest pain, shortness of breath, mood changes. POSITIVE muscle aches  Objective  Blood pressure 108/72, pulse 69, height 5' 6 (1.676 m), weight 176 lb (79.8 kg), SpO2 98%.   General: No apparent distress alert and oriented x3 mood and affect normal, dressed appropriately.  HEENT: Pupils equal, extraocular movements intact  Respiratory: Patient's speak in full sentences and does not appear short of breath  Cardiovascular:  No lower extremity edema, non tender, no erythema  Right knee exam shows patient does have trace effusion noted.  Mild crepitus noted.  Positive patellar grind test noted.  Limited muscular skeletal ultrasound was performed and interpreted by CLAUDENE HUSSAR, M   Limited ultrasound shows hypoechoic changes with what appears to be coagulated blood also in the patellofemoral joint.  Contusion noted of the superior lateral aspect of the patella.  Medial meniscus is intact, lateral joint space is unremarkable.  After informed written and verbal consent, patient was seated on exam table. Right knee was prepped with alcohol swab and utilizing anterolateral approach, patient's right knee space was injected with 4:1  marcaine  0.5%: Kenalog 40mg /dL. Patient tolerated the procedure well without immediate complications.   Impression and Recommendations:    The above documentation has been reviewed and is accurate and complete Robynn Marcel M Chevelle Coulson, DO

## 2024-05-27 ENCOUNTER — Ambulatory Visit: Payer: Self-pay | Admitting: Family Medicine

## 2024-05-28 ENCOUNTER — Other Ambulatory Visit: Payer: Self-pay

## 2024-05-28 DIAGNOSIS — M25561 Pain in right knee: Secondary | ICD-10-CM

## 2024-06-02 ENCOUNTER — Ambulatory Visit: Admitting: Family Medicine

## 2024-06-04 ENCOUNTER — Ambulatory Visit
Admission: RE | Admit: 2024-06-04 | Discharge: 2024-06-04 | Disposition: A | Discharge status: Home / Self Care | Source: Ambulatory Visit | Attending: Family Medicine | Admitting: Family Medicine

## 2024-06-04 DIAGNOSIS — M25561 Pain in right knee: Secondary | ICD-10-CM

## 2024-06-05 ENCOUNTER — Ambulatory Visit: Payer: Self-pay | Admitting: Family Medicine

## 2024-06-05 ENCOUNTER — Telehealth: Payer: Self-pay

## 2024-06-05 NOTE — Telephone Encounter (Signed)
 Patient ran for Durolane for right knee on 06/05/24. Pending approval.

## 2024-06-05 NOTE — Telephone Encounter (Signed)
 Patient ran for Durolane for right knee on 06/05/24. Case ID #: [id number]. Pending approval.

## 2024-06-06 NOTE — Telephone Encounter (Signed)
 Patient needs an appointment once medication is stocked.  Durolane is approved for right knee.  Patient has a Fully [employer] Calendar year Plan with an effective date of 03/11/2024 Plan follows [employer] guidelines. Specialist office visits, Durolane [phone] and procedures 20610/20611 are covered at 100% of the allowable. Deductible and out of pocket have been met, coverage is now at 100% of the allowable. No pre-cert or referrals needed. Medical notes must be submitted with the claim. Include medical necessity, diagnosis, and all clinicals. Provider is in network under the NPI: [id number] Practice is in network under the NPI: [id number]  Reference # [id number] Case ID: [id number] Exp: 12/04/2023

## 2024-06-06 NOTE — Telephone Encounter (Signed)
 Scheduled 11/13.

## 2024-06-11 ENCOUNTER — Ambulatory Visit: Admitting: Family Medicine

## 2024-06-18 ENCOUNTER — Encounter: Payer: Self-pay | Admitting: Pediatrics

## 2024-06-19 ENCOUNTER — Ambulatory Visit: Admitting: Family Medicine

## 2024-06-19 ENCOUNTER — Encounter: Payer: Self-pay | Admitting: Pediatrics

## 2024-06-23 LAB — HM MAMMOGRAPHY

## 2024-06-25 NOTE — Progress Notes (Unsigned)
 06/26/2024 Jasmine Bullock [id number] [birth year]  Referring provider: Lendia Boby CROME, NP-C Primary GI doctor: Dr. Suzann ( Dr. Eda)   ASSESSMENT AND PLAN:  Episodic epigastric pain can last for hours, no radiation to back, can have nausea due to pain, will force self to vomit Trying to lose weight for knee pain In between episodes can eat without issue if she avoids high fat, acidic foods Is preventing her from going to kids games, and quality of life CT abdomen pelvis 10/2023  s/p appendectomy  12/10/2023 HIDA scan unremarkable 12/10/2023 cardiac calcium score 0 02/18/2024 EGD Dr. Suzann normal esophagus normal gastric body normal duodenum biopsy showed negative celiac negative H. 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 Pelvic floor ? IBS-C - check KUB - consider linzess if abnormal  Screening colonoscopy No family history of colon cancer Screening at age 59 unless she develops symptoms  I have reviewed the clinic note as  outlined by Jasmine Bullock and agree with the assessment, plan and medical decision making.  Jasmine Bullock returns to the office today for follow-up of episodic epigastric pain and nausea.  She had symptoms a few years ago that gradually improved with proton pump inhibitor therapy.  Current symptoms have been present since early this year.  She has undergone appendectomy without any improvement.  Extensive laboratory and radiographic testing have been unremarkable-negative CT, HIDA.  EGD did not show any evidence of celiac disease or H. pylori.  Other potential causes that are less likely are outlined in Jasmine Bullock note-MALS, hereditary angioedema, mast cell activation syndrome and porphyria but not unreasonable to test for.  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  I would consider a trial of amitriptyline or nortriptyline.  If this is ineffective could consider alternate agents such as cyproheptadine or gabapentin.  Jasmine Suzann, MD    Patient Care Team: Jasmine Boby CROME, NP-C as PCP - General (Family Medicine) Jasmine Bullock Iha, MD (Inactive) as Consulting Physician (Gastroenterology) Jasmine Rush, MD as Consulting Physician (Orthopedic Surgery) Jasmine Sor, MD as Consulting Physician (Obstetrics and Gynecology)  HISTORY OF PRESENT ILLNESS: 43 y.o. female with a past medical history listed below presents for evaluation of epigastric pain.   I last saw the patient in the office 02/12/2024 for dyspepsia.  Unremarkable EGD and gastric emptying study.  Discussed the use of AI scribe software for clinical note transcription with the patient, who gave verbal consent to proceed.  History of Present Illness   Jasmine  [name] is a 43 year old female who presents with recurrent abdominal pain and nausea.  She experiences severe abdominal pain that comes in waves, lasting for hours to days, and  has been recurrent over the past few months. The pain is primarily located in the upper abdomen and does not typically radiate to the back, although she has experienced a mild dull ache in the lower right back during one attack. The pain is severe enough to disrupt her daily activities, causing her to miss two soccer tournaments recently. She describes the pain as coming on suddenly and intensely, with no clear triggers, although she has noted occasional associations with high-fat foods. The episodes have been sporadic, with pain occurring on some days and not others, and lasting for several hours to a weekend. No fever or chills accompany these episodes.  Nausea is a significant symptom during these attacks, sometimes leading her to induce vomiting in an attempt to relieve the discomfort. She has tried using Levsin  for relief, which initially helped but was ineffective during the most recent episode.  Her past medical workup includes an EGD that was negative for H. pylori and celiac disease, but showed slight chronic inactive gastritis. A gastric emptying study was also negative. She had a gallbladder ultrasound in the past which showed no significant findings, although scarring was noted during an appendectomy.  She has been avoiding high-fat and acidic foods, and has reduced her intake of hydration drinks and coffee. She has been taking Celebrex  for knee pain but has recently stopped due to concerns about its impact on her stomach. She is currently on pantoprazole .  No recent weight loss, although she is intentionally trying to lose weight for knee pain. She reports regular bowel movements, although they are harder than usual, and denies any significant constipation. No fever, chills, or significant constipation. No rashes or swelling in the mouth.        She  reports that she quit smoking about 20 years ago. Her smoking use included cigarettes. She started smoking about 25 years ago. She has a 1.3  pack-year smoking history. She has never used smokeless tobacco. She reports current alcohol use. She reports that she does not use drugs.  RELEVANT GI HISTORY, IMAGING AND LABS: Results   RADIOLOGY Gallbladder ultrasound: No abnormalities (10/2023)  DIAGNOSTIC EGD: Slight chronic gastritis, negative for H. pylori, negative for celiac disease, no intestinal metaplasia, chronic inactive gastritis Gastric emptying: Negative (03/2024)     11/06/2023 CT Ab without contrast 1. Acute appendicitis, without evidence of acute rupture or abscess. 2. Punctate bilateral nonobstructive nephrolithiasis. 3. Stable punctate calcification inferiorly along the urinary bladder wall, no change from last year, probably not loose within the urinary bladder given the non dependent position. 4. Stable subtle hypodensity anteriorly in the lateral segment left hepatic lobe potentially from focal steatosis or a small cyst or similar benign lesion. No change over the past year. CBC    Component Value Date/Time   WBC 14.0 (H) 04/28/2024 0939   RBC 4.26 04/28/2024 0939   HGB 13.2 04/28/2024 0939   HGB 13.9 06/27/2019 0930   HCT 39.4 04/28/2024 0939   HCT 40.0 06/27/2019 0930   PLT 225.0 04/28/2024 0939   PLT 247 06/27/2019 0930   MCV 92.5 04/28/2024 0939   MCV 93 06/27/2019 0930   MCH 32.0 11/26/2023 1523   MCHC 33.4 04/28/2024 0939   RDW 13.1 04/28/2024 0939   RDW 11.4 (L) 06/27/2019 0930   LYMPHSABS 1.3 04/28/2024  0939   LYMPHSABS 2.0 06/27/2019 0930   MONOABS 1.1 (H) 04/28/2024 0939   EOSABS 0.1 04/28/2024 0939   EOSABS 0.2 06/27/2019 0930   BASOSABS 0.0 04/28/2024 0939   BASOSABS 0.0 06/27/2019 0930   Recent Labs    08/22/23 1114 11/06/23 0939 11/06/23 2004 11/26/23 1523 02/12/24 0933 04/28/24 0939  HGB 14.7 14.2 13.9 13.7 13.6 13.2    CMP     Component Value Date/Time   NA 137 04/28/2024 0939   NA 138 06/27/2019 0930   K 4.1 04/28/2024 0939   CL 103 04/28/2024 0939   CO2 27  04/28/2024 0939   GLUCOSE 89 04/28/2024 0939   BUN 9 04/28/2024 0939   BUN 11 06/27/2019 0930   CREATININE 0.67 04/28/2024 0939   CREATININE 0.66 07/09/2020 1340   CALCIUM 8.6 04/28/2024 0939   PROT 6.3 04/28/2024 0939   PROT 6.9 06/27/2019 0930   ALBUMIN 3.9 04/28/2024 0939   ALBUMIN 4.2 06/27/2019 0930   AST 16 04/28/2024 0939   ALT 12 04/28/2024 0939   ALKPHOS 42 04/28/2024 0939   BILITOT 0.6 04/28/2024 0939   BILITOT 0.6 06/27/2019 0930   GFRNONAA >60 11/26/2023 1523   GFRNONAA 111 07/09/2020 1340   GFRAA 129 07/09/2020 1340      Latest Ref Rng & Units 04/28/2024    9:39 AM 02/12/2024    9:33 AM 11/26/2023    3:23 PM  Hepatic Function  Total Protein 6.0 - 8.3 g/dL 6.3  6.7  6.6   Albumin 3.5 - 5.2 g/dL 3.9  4.3  3.8   AST 0 - 37 U/L 16  14  17    ALT 0 - 35 U/L 12  10  11    Alk Phosphatase 39 - 117 U/L 42  39  42   Total Bilirubin 0.2 - 1.2 mg/dL 0.6  0.7  0.6       Current Medications:      Current Outpatient Medications (Analgesics):    acetaminophen  (TYLENOL ) 500 MG tablet, Take 1,000 mg by mouth as needed for mild pain (pain score 1-3).   celecoxib  (CELEBREX ) 100 MG capsule, Take 1 capsule (100 mg total) by mouth 2 (two) times daily.   Current Outpatient Medications (Other):    Calcium Carb-Cholecalciferol (CALCIUM + VITAMIN D3) 600-10 MG-MCG TABS, Take 1 tablet by mouth daily.   citalopram  (CELEXA ) 20 MG tablet, TAKE 1 TABLET(20 MG) BY MOUTH DAILY   dicyclomine (BENTYL) 20 MG tablet, Take 1 tablet (20 mg total) by mouth 3 (three) times daily as needed for spasms.   Glucosamine HCl (GLUCOSAMINE PO), Take by mouth.   pantoprazole  (PROTONIX ) 40 MG tablet, Take 1 tablet (40 mg total) by mouth daily.   sucralfate  (CARAFATE ) 1 g tablet, Take 1 tablet (1 g total) by mouth 4 (four) times daily.  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 Family History:  Her family history includes Alcohol abuse in her maternal grandfather; Appendicitis in her son; Diabetes in her paternal grandfather; Gallbladder disease in her father; Healthy in her sister and son; Hearing loss in her father and paternal grandfather; Heart disease in her paternal grandfather; Hyperlipidemia in her mother and paternal grandmother; Hypertension in her mother, paternal grandfather, and paternal grandmother; Ovarian cancer in her maternal grandmother; Stroke in her paternal grandfather; Ulcers in her father.  REVIEW OF SYSTEMS  : All other systems reviewed and negative except where noted in the History of Present Illness.  PHYSICAL EXAM: BP 100/60 (BP Location: Left Arm, Patient Position: Sitting, Cuff Size: Normal)   Pulse 72   Ht 5' 6.5 (1.689 m)   Wt 170 lb (77.1 kg)   BMI 27.03 kg/m  Physical Exam   GENERAL APPEARANCE: Well nourished, in no apparent distress. HEENT: No cervical lymphadenopathy, unremarkable thyroid, sclerae anicteric, conjunctiva pink. RESPIRATORY: Respiratory effort normal, breath sounds equal bilaterally without rales, rhonchi, or wheezing. CARDIO: Regular rate and rhythm with no murmurs, rubs, or gallops, peripheral pulses intact. ABDOMEN:  Soft, non-distended, active bowel sounds in all four quadrants, non-tender to palpation, no rebound tenderness, no masses appreciated. Abdomen normal on deep inspiration and head lift, but feels unusual on palpation. RECTAL: Declines. MUSCULOSKELETAL: Full range of motion, normal gait, without edema. SKIN: Dry, intact without rashes or lesions. No jaundice. NEURO: Alert, oriented, no focal deficits. PSYCH: Cooperative, normal mood and affect.      Jasmine JONELLE Coombs, Bullock-C 11:14 AM

## 2024-06-26 ENCOUNTER — Ambulatory Visit (INDEPENDENT_AMBULATORY_CARE_PROVIDER_SITE_OTHER)
Admission: RE | Admit: 2024-06-26 | Discharge: 2024-06-26 | Disposition: A | Discharge status: Home / Self Care | Source: Ambulatory Visit | Attending: Physician Assistant | Admitting: Physician Assistant

## 2024-06-26 ENCOUNTER — Ambulatory Visit: Admitting: Physician Assistant

## 2024-06-26 ENCOUNTER — Encounter: Payer: Self-pay | Admitting: Physician Assistant

## 2024-06-26 ENCOUNTER — Other Ambulatory Visit

## 2024-06-26 VITALS — BP 100/60 | HR 72 | Ht 66.5 in | Wt 170.0 lb

## 2024-06-26 DIAGNOSIS — R112 Nausea with vomiting, unspecified: Secondary | ICD-10-CM

## 2024-06-26 DIAGNOSIS — K5900 Constipation, unspecified: Secondary | ICD-10-CM

## 2024-06-26 DIAGNOSIS — R1013 Epigastric pain: Secondary | ICD-10-CM

## 2024-06-26 DIAGNOSIS — R140 Abdominal distension (gaseous): Secondary | ICD-10-CM

## 2024-06-26 DIAGNOSIS — R6881 Early satiety: Secondary | ICD-10-CM | POA: Diagnosis not present

## 2024-06-26 MED ORDER — DICYCLOMINE HCL 20 MG PO TABS: 20.0000 mg | ORAL_TABLET | Freq: Three times a day (TID) | ORAL | 0 refills | Status: AC | PRN

## 2024-06-26 NOTE — Patient Instructions (Addendum)
 Your provider has requested that you have an abdominal x ray before leaving today. Please go to the basement floor to our Radiology department for the test.  Your provider has requested that you go to the basement level for lab work before leaving today. Press B on the elevator. The lab is located at the first door on the left as you exit the elevator.  VISIT SUMMARY:  You came in today because of recurring severe abdominal pain and nausea that have been affecting your daily activities. We discussed your symptoms, past medical workup, and possible causes. We have a plan to further investigate and manage your condition.  YOUR PLAN:  RECURRENT EPISODIC SEVERE ABDOMINAL PAIN WITH NAUSEA AND VOMITING: You have been experiencing severe abdominal pain and nausea that come and go, sometimes lasting for hours to days. -We will refer you to a general surgeon to evaluate your gallbladder. -An abdominal x-ray will be done to get more information. -You will start taking dicyclomine to help with the pain. -Please provide a urine sample after your next attack. -We will order additional lab tests to check for rare conditions. -A note will be sent to the surgeon to consider a CT scan.  CONSTIPATION: You have regular bowel movements, but they are harder than usual, which might be contributing to your abdominal pain. -An abdominal x-ray will be done to check for any issues.  CHRONIC INACTIVE GASTRITIS, LIKELY NSAID-INDUCED: You have chronic inactive gastritis, likely caused by NSAID use. -Stop taking Celebrex . -Continue taking pantoprazole  as prescribed.

## 2024-06-27 ENCOUNTER — Other Ambulatory Visit: Payer: Self-pay | Admitting: Surgery

## 2024-06-27 ENCOUNTER — Encounter: Payer: Self-pay | Admitting: Family Medicine

## 2024-06-27 ENCOUNTER — Ambulatory Visit: Payer: Self-pay | Admitting: Physician Assistant

## 2024-06-27 ENCOUNTER — Ambulatory Visit: Payer: Self-pay | Admitting: Surgery

## 2024-06-27 DIAGNOSIS — R1011 Right upper quadrant pain: Secondary | ICD-10-CM

## 2024-06-27 LAB — C3 AND C4
C3 Complement: 114 mg/dL (ref 83–193)
C4 Complement: 29 mg/dL (ref 15–57)

## 2024-06-27 MED ORDER — ONDANSETRON HCL 4 MG PO TABS: 4.0000 mg | ORAL_TABLET | Freq: Three times a day (TID) | ORAL | 0 refills | Status: AC | PRN

## 2024-06-27 NOTE — H&P (Signed)
 Subjective    Chief Complaint: Abdominal Pain       History of Present Illness: Jasmine Bullock is a 43 y.o. female who is seen today for follow-up of abdominal pain.   This is a 43 year old female who was recently traveling in [place]  where she began having some right upper quadrant abdominal pain.  She was evaluated in the emergency department there.  Ultrasound was negative.  This was confirmed via epic.  The patient return home and continue to have some right sided abdominal pain.  CT scan was obtained that revealed early appendicitis.  On 11/07/2023, she underwent laparoscopic appendectomy.  This showed chronic appendicitis with an area of active inflammation.  At the time of surgery, she did have some omental adhesions to the gallbladder but no signs of active inflammation or thickening.   After surgery, the patient continues to have some epigastric and right upper quadrant abdominal pain.  This tends to be exacerbated by eating.  Her episodes are much more frequent and seem to be lasting longer.  This is starting to interfere with her daily activities.  There is some abdominal bloating.  Mild nausea.  She describes indigestion for the last several years.  She had recently been referred to GI but has not yet had an evaluation.  I last saw her in March of this year.   HIDA scan revealed a gallbladder ejection fraction of 73%.  Gastric emptying study showed normal gastric emptying.  She has also had an EGD in June of this year that was unremarkable.  She is currently on Protonix , Carafate , Pepcid, as well as Bentyl.  She reports more frequent episodes of right upper quadrant abdominal pain associated with some nausea and abdominal bloating.  She denies any episodes of diarrhea.  She has been seen by GI recently who has ordered some more blood work.  Plain films yesterday showed a moderate to large stool burden without evidence of bowel obstruction.  Liver function test on 04/28/2024 were  unremarkable.   Review of Systems: A complete review of systems was obtained from the patient.  I have reviewed this information and discussed as appropriate with the patient.  See HPI as well for other ROS.   Review of Systems  Constitutional: Negative.   HENT: Negative.    Eyes: Negative.   Respiratory: Negative.    Cardiovascular: Negative.   Gastrointestinal:  Positive for abdominal pain and nausea.  Genitourinary: Negative.   Musculoskeletal: Negative.   Skin: Negative.   Neurological: Negative.   Endo/Heme/Allergies: Negative.   Psychiatric/Behavioral: Negative.          Medical History: Past Medical History      Past Medical History:  Diagnosis Date   Anxiety     Asthma, unspecified asthma severity, unspecified whether complicated, unspecified whether persistent (HHS-HCC)     GERD (gastroesophageal reflux disease)          Problem List     Patient Active Problem List  Diagnosis   Anxiety   Abnormal mammogram   Dysuria   Effusion of acromioclavicular joint, right   Epigastric pain   GAD (generalized anxiety disorder)   Fatigue        Past Surgical History       Past Surgical History:  Procedure Laterality Date   APPENDECTOMY            Allergies  No Known Allergies     Medications Ordered Prior to Encounter  Current Outpatient Medications on File Prior to Visit  Medication Sig Dispense Refill   celecoxib  (CELEBREX ) 100 MG capsule Take 100 mg by mouth 2 (two) times daily       citalopram  (CELEXA ) 20 MG tablet Take 20 mg by mouth once daily       famotidine (PEPCID) 10 MG tablet Take 10 mg by mouth       pantoprazole  (PROTONIX ) 40 MG DR tablet Take 40 mg by mouth once daily       sucralfate  (CARAFATE ) 1 gram tablet Take 1 g by mouth 4 (four) times daily       dicyclomine (BENTYL) 20 mg tablet Take 20 mg by mouth every 6 (six) hours       ibuprofen  (MOTRIN ) 600 MG tablet         multivitamin tablet Take 1 tablet by mouth once daily         No current facility-administered medications on file prior to visit.        Family History       Family History  Problem Relation Age of Onset   High blood pressure (Hypertension) Mother     Hyperlipidemia (Elevated cholesterol) Mother     Deep vein thrombosis (DVT or abnormal blood clot formation) Father          Tobacco Use History  Social History        Tobacco Use  Smoking Status Former   Types: Cigarettes   Start date: 2006  Smokeless Tobacco Never        Social History  Social History         Socioeconomic History   Marital status: Married  Tobacco Use   Smoking status: Former      Types: Cigarettes      Start date: 2006   Smokeless tobacco: Never  Vaping Use   Vaping status: Never Used  Substance and Sexual Activity   Alcohol use: Yes   Drug use: Never    Social Drivers of Acupuncturist Strain: Low Risk  (09/26/2023)    Received from [facility]    Overall Financial Resource Strain (CARDIA)     Difficulty of Paying Living Expenses: Not hard at all  Food Insecurity: No Food Insecurity (09/26/2023)    Received from [facility]    Hunger Vital Sign     Within the past 12 months, you worried that your food would run out before you got the money to buy more.: Never true     Within the past 12 months, the food you bought just didn't last and you didn't have money to get more.: Never true  Transportation Needs: No Transportation Needs (09/26/2023)    Received from [facility] - Transportation     Lack of Transportation (Medical): No     Lack of Transportation (Non-Medical): No  Physical Activity: Insufficiently Active (09/26/2023)    Received from [facility]    Exercise Vital Sign     On average, how many days per week do you engage in moderate to strenuous exercise (like a brisk walk)?: 3 days     On average, how many minutes do you engage in exercise at this level?: 40 min  Stress: No Stress Concern Present (09/26/2023)     Received from [facility] - Occupational Stress Questionnaire     Feeling of Stress : Only a little  Social Connections: Socially Integrated (09/26/2023)    Received from [facility]    Social Connection and Isolation Panel     In a typical week, how many times do you talk on the phone with family, friends, or neighbors?: More than three times a week     How often do you get together with friends or relatives?: More than three times a week     How often do you attend church or religious services?: More than 4 times per year     Do you belong to any clubs or organizations such as church groups, unions, fraternal or athletic groups, or school groups?: Yes     How often do you attend meetings of the clubs or organizations you belong to?: More than 4 times per year     Are you married, widowed, divorced, separated, never married, or living with a partner?: Married  Housing Stability: Unknown (11/30/2023)    Housing Stability Vital Sign     Homeless in the Last Year: No        Objective:        Physical Exam    Well-developed well-nourished in no apparent distress Abdomen is minimally distended.  Tender in the right upper quadrant with no palpable masses.  No guarding or rebound.  No other abdominal tenderness. Her laparoscopic incisions are all well-healed     Labs, Imaging and Diagnostic Testing:   CLINICAL DATA:  Chronic upper abdominal pain, assess gallbladder motility.   EXAM: NUCLEAR MEDICINE HEPATOBILIARY IMAGING WITH GALLBLADDER EF   TECHNIQUE: Sequential images of the abdomen were obtained out to 60 minutes following intravenous administration of radiopharmaceutical. After oral ingestion of Ensure, gallbladder ejection fraction was determined. At 60 min, normal ejection fraction is greater than 33%.   RADIOPHARMACEUTICALS:  5.4 mCi Tc-67m  Choletec  IV   COMPARISON:  CT November 06, 2023   FINDINGS: Prompt uptake and  biliary excretion of activity by the liver is seen. Gallbladder activity is visualized, consistent with patency of cystic duct. Biliary activity passes into small bowel, consistent with patent common bile duct.   Calculated gallbladder ejection fraction is 73%. (Normal gallbladder ejection fraction with Ensure is greater than 33% and less than 80%.)   IMPRESSION: 1.  Patent cystic and common bile ducts.   2.  Normal gallbladder ejection fraction.     Electronically Signed   By: Reyes Holder M.D.   On: 12/10/2023 16:34   CLINICAL DATA:  Epigastric pain, nausea, vomiting, bloating, dyspepsia.   EXAM: NUCLEAR MEDICINE GASTRIC EMPTYING SCAN   TECHNIQUE: After oral ingestion of radiolabeled meal, sequential abdominal images were obtained for 3 hours. Percentage of activity emptying the stomach was calculated at 1 hour, 2 hour, and 3 hours.   RADIOPHARMACEUTICALS:  2.03 mCi Tc-65m sulfur  colloid in standardized meal   COMPARISON:  CT November 06, 2023   FINDINGS: Expected location of the stomach in the left upper quadrant. Ingested meal empties the stomach gradually over the course of the study.   51% emptied at 1 hr ( normal >= 10%)   78% emptied at 2 hr ( normal >= 40%)   93% emptied at 3 hr ( normal >= 70%)   IMPRESSION: Normal gastric emptying study.     Electronically Signed   By: Reyes Holder M.D.   On: 03/18/2024 16:36   CLINICAL DATA:  Epigastric pain with nausea and vomiting.   EXAM: ABDOMEN - 1 VIEW   COMPARISON:  None Available.   FINDINGS:  The bowel gas pattern is normal. A moderate to large amount of stool is seen throughout the colon. 1 mm and 2 mm soft tissue calcifications are seen projecting over the mid and lower right kidney. Surgical sutures are seen overlying the mid to lower right abdomen.   IMPRESSION: 1. Moderate to large stool burden without evidence of bowel obstruction. 2. Small nonobstructing right renal calculi.      Electronically Signed   By: Suzen Dials M.D.   On: 06/26/2024 11:46       Assessment and Plan:  Diagnoses and all orders for this visit:   RUQ pain     Despite the normal HIDA scan, I still feel that the patient's symptoms are most likely from her gallbladder.  She did have some visible adhesions when I performed her laparoscopic appendectomy.  Her symptoms are becoming more frequent.  The patient is slightly reluctant to agree to surgery unless there was a definitive test showing clear gallbladder pathology.  I do not see any ultrasounds of her gallbladder in recent history.  We will obtain an ultrasound to evaluate for possible gallstones or gallbladder sludge.   I explained to the patient that she will likely need to have an elective cholecystectomy.  We went ahead and discussed the possibility of surgery today.  Will discuss this further with the patient after her ultrasound is complete.   DONNICE DEWAYNE LIMA, MD    06/27/2024 12:07 PM

## 2024-06-27 NOTE — Telephone Encounter (Signed)
 Added to wait list.

## 2024-06-27 NOTE — Addendum Note (Signed)
 Addended by: CRAIG PALMA on: 06/27/2024 11:00 AM   Modules accepted: Orders

## 2024-06-27 NOTE — Telephone Encounter (Signed)
Patient is on wait list

## 2024-07-01 LAB — TRYPTASE: Tryptase: 2.9 ug/L (ref <11.0)

## 2024-07-01 LAB — C1 ESTERASE INHIBITOR, FUNCTIONAL: C1 Esterase Inhibitor Funct: >100 % (ref >=68)

## 2024-07-02 ENCOUNTER — Ambulatory Visit
Admission: RE | Admit: 2024-07-02 | Discharge: 2024-07-02 | Disposition: A | Discharge status: Home / Self Care | Source: Ambulatory Visit | Attending: Surgery | Admitting: Surgery

## 2024-07-02 DIAGNOSIS — R1011 Right upper quadrant pain: Secondary | ICD-10-CM

## 2024-07-03 ENCOUNTER — Ambulatory Visit: Payer: Self-pay | Admitting: Surgery

## 2024-07-03 NOTE — Progress Notes (Signed)
 I called and left a voicemail for the patient discussing her ultrasound report.  I still recommend laparoscopic cholecystectomy.  I asked her to call and leave you a message if she wants to proceed with surgery.  I think you have the orders already.

## 2024-07-04 NOTE — Progress Notes (Unsigned)
 [facility] [address] [place] [address] Phone: [phone] Subjective:    I'm seeing this patient by the request  of:  Jasmine Boby CROME, NP-C  CC:   YEP:[name]  05/20/2024 Patient responded extremely well on the contralateral side.  We do expect patient to do extremely well.  Discussed icing regimen and home exercises, which activities to do and which ones to avoid.  Increase activity slowly.  Discussed icing regimen.  Do believe that there is some lateral translation of the patella noted and given a Tru pull lite brace that I think will be beneficial for activity.  He can take anti-inflammatories as needed.  Follow-up again in 6 to 8 weeks     Updated 07/10/2024 Jasmine Bullock is a 43 y.o. 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   GERD (gastroesophageal reflux disease) 01/28/2020   Plantar fasciitis    left foot   Postpartum hemorrhage 1348cc 04/16/2022   Pregnancy 09/19/2021   Past Surgical History:  Procedure Laterality Date   L wrist surgery     LAPAROSCOPIC APPENDECTOMY N/A 11/07/2023   Procedure: APPENDECTOMY LAPAROSCOPIC;  Surgeon: Belinda Cough, MD;  Location: [facility] OR;  Service: General;  Laterality: N/A;   PLANTAR FASCIA RELEASE Left 01/27/2020   WISDOM TOOTH EXTRACTION     Social History   Socioeconomic History   Marital status: Married    Spouse name: Not on file   Number of children: 2   Years of education: Not on file   Highest education level:  Bachelor's degree (e.g., BA, AB, BS)  Occupational History   Occupation: dental hygentist   Tobacco Use   Smoking status: Former    Current packs/day: 0.00    Average packs/day: 0.3 packs/day for 5.2 years (1.3 ttl pk-yrs)    Types: Cigarettes    Start date: 02/10/1999    Quit date: 02/10/2004    Years since quitting: 20.4   Smokeless tobacco: Never   Tobacco comments:    Light social smoker in college  Vaping Use   Vaping status: Never Used  Substance and Sexual Activity   Alcohol use: Yes    Comment: Rarely   Drug use: No   Sexual activity: Yes    Partners: Male    Birth control/protection: Condom, None    Comment: Delivered baby boy in August  Other Topics Concern   Not on file  Social History Narrative   Marital status: Married since 2009   [id number]   Social Drivers of Health   Financial Resource Strain: Low Risk  (09/26/2023)   Overall Financial Resource Strain (CARDIA)    Difficulty of Paying Living Expenses: Not hard at all  Food Insecurity: No Food Insecurity (09/26/2023)   Hunger Vital Sign    Worried About Running Out of Food in the Last Year: Never true    Ran Out of Food in the Last Year: Never true  Transportation  Needs: No Transportation Needs (09/26/2023)   PRAPARE - Administrator, Civil Service (Medical): No    Lack of Transportation (Non-Medical): No  Physical Activity: Insufficiently Active (09/26/2023)   Exercise Vital Sign    Days of Exercise per Week: 3 days    Minutes of Exercise per Session: 40 min  Stress: No Stress Concern Present (09/26/2023)   [name] of Occupational Health - Occupational Stress Questionnaire    Feeling of Stress : Only a little  Social Connections: Socially Integrated (09/26/2023)   Social Connection and Isolation Panel    Frequency of Communication with Friends and Family: More than three times a week    Frequency of Social Gatherings with Friends and Family: More than three times a week    Attends  Religious Services: More than 4 times per year    Active Member of [employer] or Organizations: Yes    Attends Engineer, structural: More than 4 times per year    Marital Status: Married   No Known Allergies Family History  Problem Relation Age of Onset   Hypertension Mother    Hyperlipidemia Mother    Ulcers Father    Hearing loss Father    Gallbladder disease Father    Healthy Sister    Ovarian cancer Maternal Grandmother    Alcohol abuse Maternal Grandfather    Hypertension Paternal Grandmother    Hyperlipidemia Paternal Grandmother    Diabetes Paternal Grandfather    Heart disease Paternal Grandfather    Hearing loss Paternal Grandfather    Hypertension Paternal Grandfather    Stroke Paternal Grandfather    Appendicitis Son    Healthy Son    Colon cancer Neg Hx    Rectal cancer Neg Hx    Esophageal cancer Neg Hx    Breast cancer Neg Hx        Current Outpatient Medications (Analgesics):    acetaminophen  (TYLENOL ) 500 MG tablet, Take 1,000 mg by mouth as needed for mild pain (pain score 1-3).   celecoxib  (CELEBREX ) 100 MG capsule, Take 1 capsule (100 mg total) by mouth 2 (two) times daily.   Current Outpatient Medications (Other):    Calcium Carb-Cholecalciferol (CALCIUM + VITAMIN D3) 600-10 MG-MCG TABS, Take 1 tablet by mouth daily.   citalopram  (CELEXA ) 20 MG tablet, TAKE 1 TABLET(20 MG) BY MOUTH DAILY   dicyclomine (BENTYL) 20 MG tablet, Take 1 tablet (20 mg total) by mouth 3 (three) times daily as needed for spasms.   Glucosamine HCl (GLUCOSAMINE PO), Take by mouth.   ondansetron  (ZOFRAN ) 4 MG tablet, Take 1 tablet (4 mg total) by mouth every 8 (eight) hours as needed for nausea or vomiting.   pantoprazole  (PROTONIX ) 40 MG tablet, Take 1 tablet (40 mg total) by mouth daily.   sucralfate  (CARAFATE ) 1 g tablet, Take 1 tablet (1 g total) by mouth 4 (four) times daily.   Reviewed prior external information including notes and imaging from  primary care  provider As well as notes that were available from care everywhere and other healthcare systems.  Past medical history, social, surgical and family history all reviewed in electronic medical record.  No pertanent information unless stated regarding to the chief complaint.   Review of Systems:  No headache, visual changes, nausea, vomiting, diarrhea, constipation, dizziness, abdominal pain, skin rash, fevers, chills, night sweats, weight loss, swollen lymph nodes, body aches, joint swelling, chest pain, shortness of breath, mood changes. POSITIVE muscle aches  Objective  Last menstrual period 06/24/2024.  General: No apparent distress alert and oriented x3 mood and affect normal, dressed appropriately.  HEENT: Pupils equal, extraocular movements intact  Respiratory: Patient's speak in full sentences and does not appear short of breath  Cardiovascular: No lower extremity edema, non tender, no erythema      Impression and Recommendations:

## 2024-07-10 ENCOUNTER — Ambulatory Visit: Admitting: Family Medicine

## 2024-07-10 VITALS — BP 106/68 | HR 67 | Ht 66.5 in | Wt 168.0 lb

## 2024-07-10 DIAGNOSIS — M222X1 Patellofemoral disorders, right knee: Secondary | ICD-10-CM

## 2024-07-10 DIAGNOSIS — M222X2 Patellofemoral disorders, left knee: Secondary | ICD-10-CM | POA: Diagnosis not present

## 2024-07-10 DIAGNOSIS — R1011 Right upper quadrant pain: Secondary | ICD-10-CM | POA: Diagnosis not present

## 2024-07-10 DIAGNOSIS — R5383 Other fatigue: Secondary | ICD-10-CM | POA: Diagnosis not present

## 2024-07-10 DIAGNOSIS — G8929 Other chronic pain: Secondary | ICD-10-CM | POA: Insufficient documentation

## 2024-07-10 LAB — LIPASE: Lipase: 19 U/L (ref 11.0–59.0)

## 2024-07-10 LAB — GAMMA GT: GGT: 6 U/L — ABNORMAL LOW (ref 7–51)

## 2024-07-10 NOTE — Patient Instructions (Addendum)
 March 2026 expiration for injection Labs today See me again in 2 months

## 2024-07-10 NOTE — Assessment & Plan Note (Signed)
 Has it intermittently, will get labs to further evaluate.  Patient has seen in general surgery and discussing the possibility of removal of the gallbladder which is could be helpful follow-up again in 6 to 8 weeks

## 2024-07-10 NOTE — Assessment & Plan Note (Signed)
 Right knee still is giving her some difficulty.  Discussed the possibility of viscosupplementation.  Patient has been approved.  At the moment feeling good so would like to hold it at the moment.  Will discuss again in 2 months otherwise.

## 2024-07-11 ENCOUNTER — Ambulatory Visit: Payer: Self-pay | Admitting: Family Medicine

## 2024-07-24 ENCOUNTER — Ambulatory Visit: Admitting: Family Medicine

## 2024-08-25 NOTE — Progress Notes (Deleted)
 [name] [address] [place] [address] Phone: [phone] Subjective:    I'm seeing this patient by the request  of:  Lendia Boby CROME, NP-C  CC:   YEP:[name]  Jasmine Bullock is a 43 y.o. female coming in with complaint of R knee pain. Durolane approved. 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   GERD (gastroesophageal reflux disease) 01/28/2020   Plantar fasciitis    left foot   Postpartum hemorrhage 1348cc 04/16/2022   Pregnancy 09/19/2021   Past Surgical History:  Procedure Laterality Date   L wrist surgery     LAPAROSCOPIC APPENDECTOMY N/A 11/07/2023   Procedure: APPENDECTOMY LAPAROSCOPIC;  Surgeon: Belinda Cough, MD;  Location: [facility] OR;  Service: General;  Laterality: N/A;   PLANTAR FASCIA RELEASE Left 01/27/2020   WISDOM TOOTH EXTRACTION     Social History   Socioeconomic History   Marital status: Married    Spouse name: Not on file   Number of children: 2   Years of education: Not on file   Highest education level: Bachelor's degree (e.g., BA, AB, BS)  Occupational History   Occupation: dental hygentist   Tobacco Use   Smoking status: Former    Current packs/day: 0.00    Average packs/day: 0.3 packs/day for 5.2 years (1.3 ttl pk-yrs)    Types: Cigarettes    Start date: 02/10/1999    Quit date: 02/10/2004    Years since quitting: 20.5   Smokeless tobacco: Never   Tobacco comments:    Light social smoker in college  Vaping Use   Vaping status: Never Used  Substance and Sexual  Activity   Alcohol use: Yes    Comment: Rarely   Drug use: No   Sexual activity: Yes    Partners: Male    Birth control/protection: Condom, None    Comment: Delivered baby boy in August  Other Topics Concern   Not on file  Social History Narrative   Marital status: Married since 2009   [id number]   Social Drivers of Health   Tobacco Use: Medium Risk (06/27/2024)   Received from [facility]   Patient History    Smoking Tobacco Use: Former    Smokeless Tobacco Use: Never    Passive Exposure: Not on file  Financial Resource Strain: Low Risk (09/26/2023)   Overall Financial Resource Strain (CARDIA)    Difficulty of Paying Living Expenses: Not hard at all  Food Insecurity: No Food Insecurity (09/26/2023)   Hunger Vital Sign    Worried About Running Out of Food in the Last Year: Never true    Ran Out of Food in the Last Year: Never true  Transportation Needs: No Transportation Needs (09/26/2023)   PRAPARE - Administrator, Civil Service (Medical): No    Lack of Transportation (Non-Medical): No  Physical Activity: Insufficiently Active (09/26/2023)   Exercise Vital Sign    Days of Exercise per Week: 3 days  Minutes of Exercise per Session: 40 min  Stress: No Stress Concern Present (09/26/2023)   [name] of Occupational Health - Occupational Stress Questionnaire    Feeling of Stress : Only a little  Social Connections: Socially Integrated (09/26/2023)   Social Connection and Isolation Panel    Frequency of Communication with Friends and Family: More than three times a week    Frequency of Social Gatherings with Friends and Family: More than three times a week    Attends Religious Services: More than 4 times per year    Active Member of Clubs or Organizations: Yes    Attends Banker Meetings: More than 4 times per year    Marital Status: Married  Depression (PHQ2-9): Low Risk (04/28/2024)   Depression (PHQ2-9)    PHQ-2 Score: 0   Alcohol Screen: Low Risk (09/26/2023)   Alcohol Screen    Last Alcohol Screening Score (AUDIT): 2  Housing: Unknown (11/30/2023)   Received from [facility]   Epic    Unable to Pay for Housing in the Last Year: Not on file    Number of Times Moved in the Last Year: Not on file    At any time in the past 12 months, were you homeless or living in a shelter (including now)?: No  Utilities: Not on file  Health Literacy: Not on file   Allergies[1] Family History  Problem Relation Age of Onset   Hypertension Mother    Hyperlipidemia Mother    Ulcers Father    Hearing loss Father    Gallbladder disease Father    Healthy Sister    Ovarian cancer Maternal Grandmother    Alcohol abuse Maternal Grandfather    Hypertension Paternal Grandmother    Hyperlipidemia Paternal Grandmother    Diabetes Paternal Grandfather    Heart disease Paternal Grandfather    Hearing loss Paternal Grandfather    Hypertension Paternal Grandfather    Stroke Paternal Grandfather    Appendicitis Son    Healthy Son    Colon cancer Neg Hx    Rectal cancer Neg Hx    Esophageal cancer Neg Hx    Breast cancer Neg Hx     Current Outpatient Medications (Analgesics):    acetaminophen  (TYLENOL ) 500 MG tablet, Take 1,000 mg by mouth as needed for mild pain (pain score 1-3).   celecoxib  (CELEBREX ) 100 MG capsule, Take 1 capsule (100 mg total) by mouth 2 (two) times daily.  Current Outpatient Medications (Other):    Calcium Carb-Cholecalciferol (CALCIUM + VITAMIN D3) 600-10 MG-MCG TABS, Take 1 tablet by mouth daily.   citalopram  (CELEXA ) 20 MG tablet, TAKE 1 TABLET(20 MG) BY MOUTH DAILY   dicyclomine  (BENTYL ) 20 MG tablet, Take 1 tablet (20 mg total) by mouth 3 (three) times daily as needed for spasms.   Glucosamine HCl (GLUCOSAMINE PO), Take by mouth.   ondansetron  (ZOFRAN ) 4 MG tablet, Take 1 tablet (4 mg total) by mouth every 8 (eight) hours as needed for nausea or vomiting.   pantoprazole   (PROTONIX ) 40 MG tablet, Take 1 tablet (40 mg total) by mouth daily.   sucralfate  (CARAFATE ) 1 g tablet, Take 1 tablet (1 g total) by mouth 4 (four) times daily.   Reviewed prior external information including notes and imaging from  primary care provider As well as notes that were available from care everywhere and other healthcare systems.  Past medical history, social, surgical and family history all reviewed in electronic medical record.  No pertanent information  unless stated regarding to the chief complaint.   Review of Systems:  No headache, visual changes, nausea, vomiting, diarrhea, constipation, dizziness, abdominal pain, skin rash, fevers, chills, night sweats, weight loss, swollen lymph nodes, body aches, joint swelling, chest pain, shortness of breath, mood changes. POSITIVE muscle aches  Objective  There were no vitals taken for this visit.   General: No apparent distress alert and oriented x3 mood and affect normal, dressed appropriately.  HEENT: Pupils equal, extraocular movements intact  Respiratory: Patient's speak in full sentences and does not appear short of breath  Cardiovascular: No lower extremity edema, non tender, no erythema      Impression and Recommendations:           [1] No Known Allergies

## 2024-08-26 ENCOUNTER — Ambulatory Visit: Admitting: Family Medicine

## 2024-08-28 ENCOUNTER — Ambulatory Visit: Admitting: Pediatrics

## 2024-09-16 ENCOUNTER — Telehealth: Payer: Self-pay

## 2024-09-16 NOTE — Telephone Encounter (Signed)
 Scheduled 09/17/24  DUROLANE authorized right knee NO PRE CERT REQUIRED  Patient responsible for 20% coinsurance Copay $60 Deductible $1000 has met $0 OOP MAX $8150 has met $0 Once OOP has been met coverage goes to 100% and copay will no longer apply Medical notes may be requested at the time of claims processing Reference number [id number]

## 2024-09-16 NOTE — Telephone Encounter (Signed)
 Patient states that her knees are not hurting right now and has canceled this appointment.

## 2024-09-16 NOTE — Telephone Encounter (Signed)
 Noted

## 2024-09-17 ENCOUNTER — Ambulatory Visit: Admitting: Family Medicine

## 2024-10-10 ENCOUNTER — Ambulatory Visit: Admitting: Internal Medicine

## 2024-10-10 ENCOUNTER — Encounter: Payer: Self-pay | Admitting: Internal Medicine

## 2024-10-10 VITALS — BP 104/68 | HR 78 | Temp 98.3°F | Ht 66.5 in | Wt 171.0 lb

## 2024-10-10 DIAGNOSIS — J069 Acute upper respiratory infection, unspecified: Secondary | ICD-10-CM

## 2024-10-10 NOTE — Progress Notes (Signed)
 "   Subjective:    Patient ID: Jasmine Bullock, female    DOB: [birth year], 44 y.o.   MRN: [id number]      HPI Jasmine Bullock is here for  Chief Complaint  Patient presents with   Sore Throat     Discussed the use of AI scribe software for clinical note transcription with the patient, who gave verbal consent to proceed.  History of Present Illness Jasmine Bullock is a 44 year old female who presents with a sore throat and concerns about recurrent illness.  Earlier this month, she was diagnosed with strep throat on January 10th, which was treated with antibiotics and resolved. She then developed a mild head cold that resolved, but last night she began feeling unwell again.  This morning, she woke up with a sore throat and is concerned about the possibility of strep throat recurrence. She also has a runny nose and a slight cough. No productive cough, fever, headaches, dizziness, shortness of breath, or sinus pressure. Her husband noted slight wheezing while she was asleep last night.  She has not taken any medications for her current symptoms except for cough drops. She cannot take ibuprofen  due to stomach issues but is able to take Tylenol .  She is concerned about her immune system as she has been sick for most of the month. She has two children, a two-year-old and an eleven-year-old, who attend preschool and middle school, and she mentions that she might be catching illnesses from them.     Medications and allergies reviewed with patient and updated if appropriate.  Medications Ordered Prior to Encounter[1]  Review of Systems  Constitutional:  Negative for fever.  HENT:  Positive for rhinorrhea and sore throat. Negative for congestion and sinus pressure.   Respiratory:  Positive for cough (dry) and wheezing (a little last night - mild). Negative for shortness of breath.   Musculoskeletal:  Positive for myalgias (slight last night).  Neurological:  Negative for dizziness and headaches.        Objective:   Vitals:   10/10/24 0909  BP: 104/68  Pulse: 78  Temp: 98.3 F (36.8 C)  SpO2: 98%   BP Readings from Last 3 Encounters:  10/10/24 104/68  07/10/24 106/68  06/26/24 100/60   Wt Readings from Last 3 Encounters:  10/10/24 171 lb (77.6 kg)  07/10/24 168 lb (76.2 kg)  06/26/24 170 lb (77.1 kg)   Body mass index is 27.19 kg/m.    Physical Exam Constitutional:      General: She is not in acute distress.    Appearance: Normal appearance. She is not ill-appearing.  HENT:     Head: Normocephalic and atraumatic.     Right Ear: Tympanic membrane, ear canal and external ear normal.     Left Ear: Tympanic membrane, ear canal and external ear normal.     Mouth/Throat:     Mouth: Mucous membranes are moist.     Pharynx: No oropharyngeal exudate or posterior oropharyngeal erythema.  Eyes:     Conjunctiva/sclera: Conjunctivae normal.  Cardiovascular:     Rate and Rhythm: Normal rate and regular rhythm.  Pulmonary:     Effort: Pulmonary effort is normal. No respiratory distress.     Breath sounds: Normal breath sounds. No wheezing or rales.  Musculoskeletal:     Cervical back: Neck supple. No tenderness.  Lymphadenopathy:     Cervical: No cervical adenopathy.  Skin:    General: Skin is warm and dry.  Neurological:  Mental Status: She is alert.            Assessment & Plan:    See Problem List for Assessment and Plan of chronic medical problems.    Assessment and Plan Assessment & Plan Acute upper respiratory infection Viral etiology confirmed by negative strep test. Symptoms mild and self-limiting. No major immune issues suspected. - Advised acetaminophen  for sore throat and body aches. - Recommended cough drops and tea for throat relief. - Encouraged rest and increased fluid intake. - Advised monitoring symptoms and reporting changes.       [1]  Current Outpatient Medications on File Prior to Visit  Medication Sig Dispense Refill    acetaminophen  (TYLENOL ) 500 MG tablet Take 1,000 mg by mouth as needed for mild pain (pain score 1-3).     Calcium Carb-Cholecalciferol (CALCIUM + VITAMIN D3) 600-10 MG-MCG TABS Take 1 tablet by mouth daily.     celecoxib  (CELEBREX ) 100 MG capsule Take 1 capsule (100 mg total) by mouth 2 (two) times daily. 60 capsule 0   citalopram  (CELEXA ) 20 MG tablet TAKE 1 TABLET(20 MG) BY MOUTH DAILY 90 tablet 0   dicyclomine  (BENTYL ) 20 MG tablet Take 1 tablet (20 mg total) by mouth 3 (three) times daily as needed for spasms. 50 tablet 0   Glucosamine HCl (GLUCOSAMINE PO) Take by mouth.     ondansetron  (ZOFRAN ) 4 MG tablet Take 1 tablet (4 mg total) by mouth every 8 (eight) hours as needed for nausea or vomiting. 20 tablet 0   pantoprazole  (PROTONIX ) 40 MG tablet Take 1 tablet (40 mg total) by mouth daily. 90 tablet 3   sucralfate  (CARAFATE ) 1 g tablet Take 1 tablet (1 g total) by mouth 4 (four) times daily. 120 tablet 0   No current facility-administered medications on file prior to visit.   "

## 2024-10-10 NOTE — Patient Instructions (Addendum)
" ° ° °  Strep test is negative.     Medications changes include :   None     Return if symptoms worsen or fail to improve.  "
